# Patient Record
Sex: Female | Born: 1935 | Race: White | Hispanic: No | Smoking: Never smoker
Health system: Southern US, Community
[De-identification: ages and names within clinical notes are randomized; demographics above are authoritative.]

## PROBLEM LIST (undated history)

## (undated) DIAGNOSIS — J189 Pneumonia, unspecified organism: Secondary | ICD-10-CM

## (undated) DIAGNOSIS — I350 Nonrheumatic aortic (valve) stenosis: Secondary | ICD-10-CM

## (undated) DIAGNOSIS — E785 Hyperlipidemia, unspecified: Secondary | ICD-10-CM

## (undated) DIAGNOSIS — K219 Gastro-esophageal reflux disease without esophagitis: Secondary | ICD-10-CM

## (undated) DIAGNOSIS — H548 Legal blindness, as defined in USA: Secondary | ICD-10-CM

## (undated) DIAGNOSIS — E039 Hypothyroidism, unspecified: Secondary | ICD-10-CM

## (undated) DIAGNOSIS — K279 Peptic ulcer, site unspecified, unspecified as acute or chronic, without hemorrhage or perforation: Secondary | ICD-10-CM

## (undated) DIAGNOSIS — M349 Systemic sclerosis, unspecified: Secondary | ICD-10-CM

## (undated) DIAGNOSIS — Z5189 Encounter for other specified aftercare: Secondary | ICD-10-CM

## (undated) DIAGNOSIS — I2510 Atherosclerotic heart disease of native coronary artery without angina pectoris: Secondary | ICD-10-CM

## (undated) DIAGNOSIS — I10 Essential (primary) hypertension: Secondary | ICD-10-CM

## (undated) DIAGNOSIS — M1990 Unspecified osteoarthritis, unspecified site: Secondary | ICD-10-CM

## (undated) DIAGNOSIS — G4733 Obstructive sleep apnea (adult) (pediatric): Secondary | ICD-10-CM

## (undated) DIAGNOSIS — L409 Psoriasis, unspecified: Secondary | ICD-10-CM

## (undated) HISTORY — DX: Legal blindness, as defined in USA: H54.8

## (undated) HISTORY — DX: Hyperlipidemia, unspecified: E78.5

## (undated) HISTORY — PX: OTHER SURGICAL HISTORY: SHX169

## (undated) HISTORY — DX: Peptic ulcer, site unspecified, unspecified as acute or chronic, without hemorrhage or perforation: K27.9

## (undated) HISTORY — DX: Obstructive sleep apnea (adult) (pediatric): G47.33

## (undated) HISTORY — DX: Nonrheumatic aortic (valve) stenosis: I35.0

## (undated) HISTORY — DX: Gastro-esophageal reflux disease without esophagitis: K21.9

## (undated) HISTORY — DX: Atherosclerotic heart disease of native coronary artery without angina pectoris: I25.10

## (undated) HISTORY — DX: Systemic sclerosis, unspecified: M34.9

## (undated) HISTORY — PX: APPENDECTOMY: SHX54

---
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
## 1998-07-23 HISTORY — PX: TOTAL KNEE ARTHROPLASTY: SHX125

## 2001-07-23 HISTORY — PX: CORNEAL TRANSPLANT: SHX108

## 2003-11-23 ENCOUNTER — Other Ambulatory Visit: Payer: Self-pay

## 2004-05-29 ENCOUNTER — Ambulatory Visit: Payer: Self-pay | Admitting: Internal Medicine

## 2005-01-05 ENCOUNTER — Ambulatory Visit: Payer: Self-pay

## 2005-07-23 HISTORY — PX: CHOLECYSTECTOMY: SHX55

## 2006-01-08 ENCOUNTER — Ambulatory Visit: Payer: Self-pay | Admitting: Specialist

## 2006-01-18 ENCOUNTER — Other Ambulatory Visit: Payer: Self-pay

## 2006-01-18 ENCOUNTER — Ambulatory Visit: Payer: Self-pay | Admitting: General Surgery

## 2006-01-25 ENCOUNTER — Ambulatory Visit: Payer: Self-pay | Admitting: General Surgery

## 2006-12-23 ENCOUNTER — Ambulatory Visit: Payer: Self-pay | Admitting: Specialist

## 2007-02-28 ENCOUNTER — Inpatient Hospital Stay: Payer: Self-pay | Admitting: Specialist

## 2007-02-28 ENCOUNTER — Other Ambulatory Visit: Payer: Self-pay

## 2008-01-01 ENCOUNTER — Ambulatory Visit: Payer: Self-pay | Admitting: Internal Medicine

## 2008-06-06 IMAGING — US ABDOMEN ULTRASOUND
1 series · 17 of 25 positions shown · non-contrast
Comparison: none

REASON FOR EXAM: Elevated Liver Enzymes
COMMENTS:

[Series 1: abdomen ultrasound · 17 of 82 slices shown]
[im 1/82]
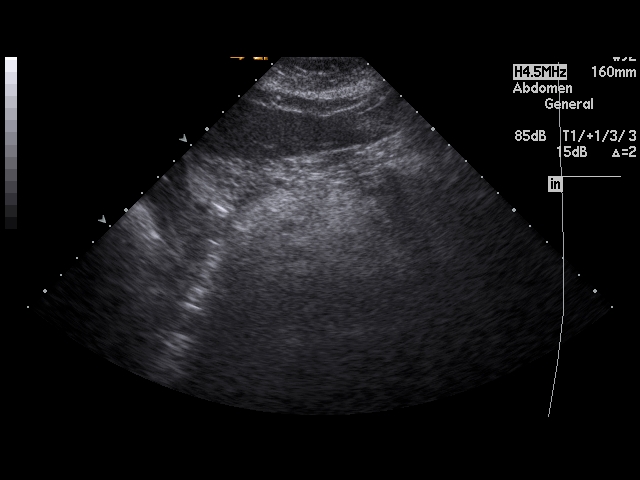
[im 7/82]
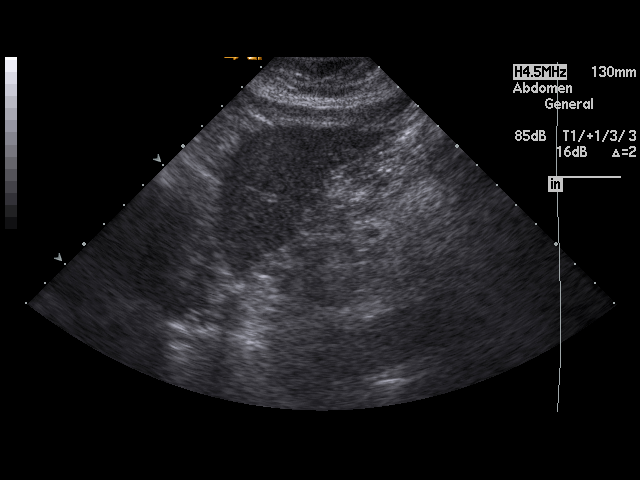
[im 11/82]
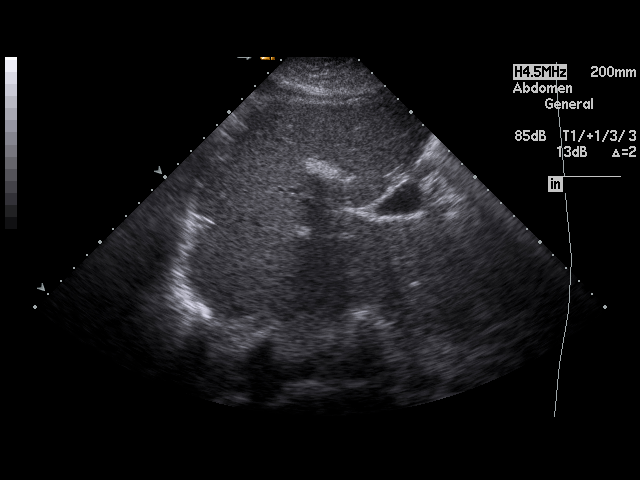
[im 17/82]
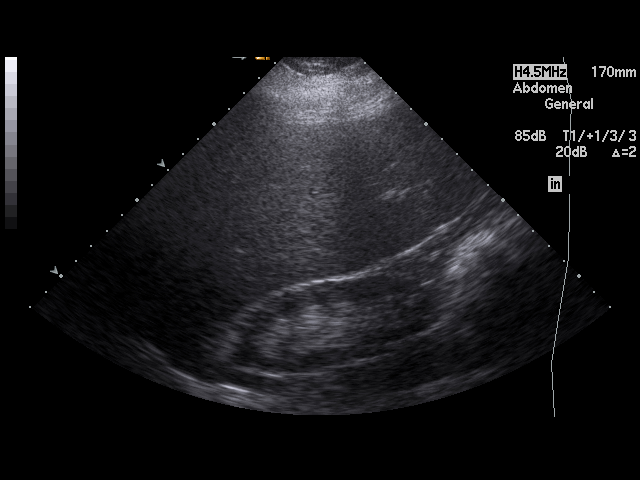
[im 21/82]
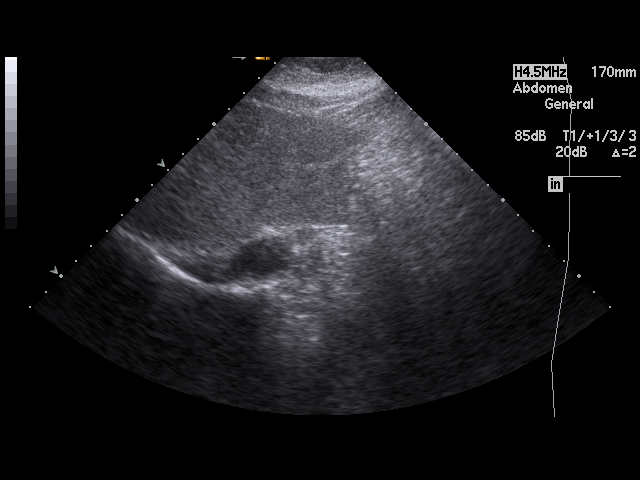
[im 28/82]
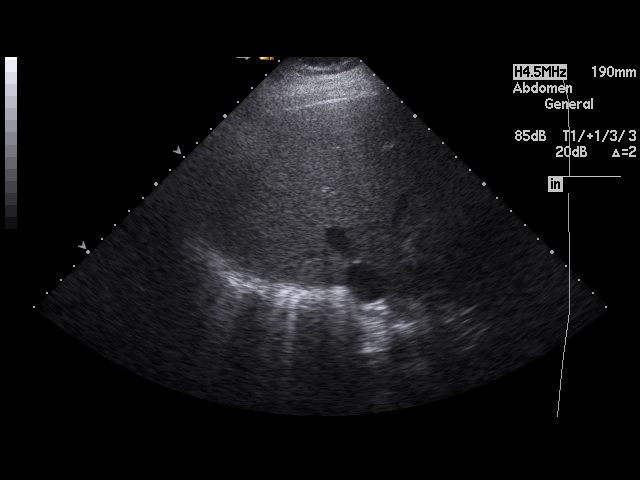
[im 31/82]
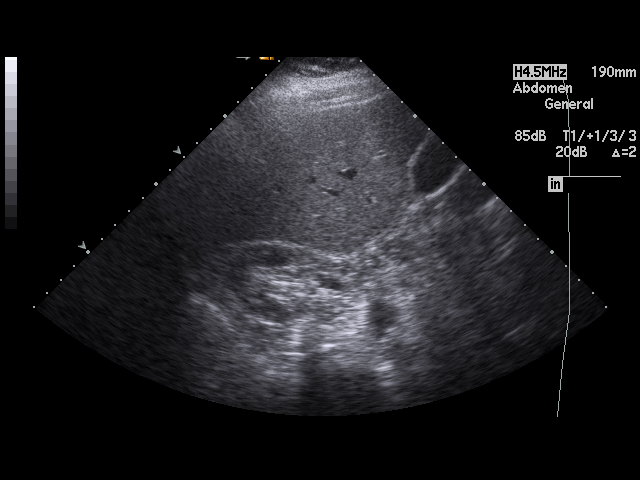
[im 38/82]
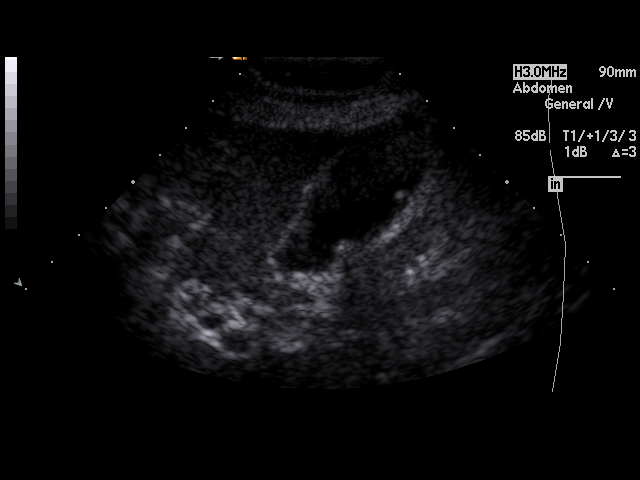
[im 41/82]
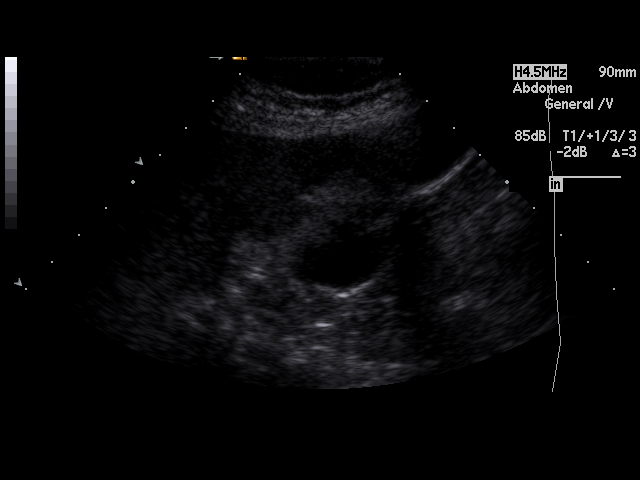
[im 44/82]
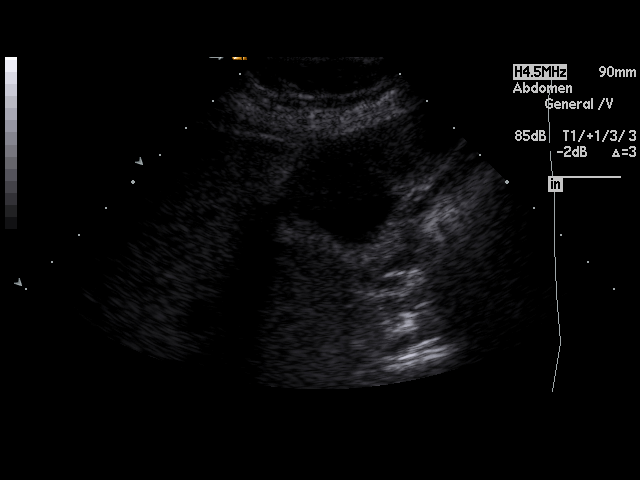
[im 51/82]
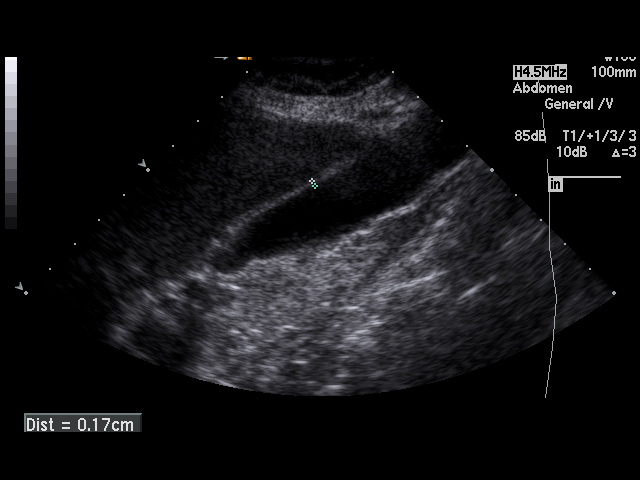
[im 55/82]
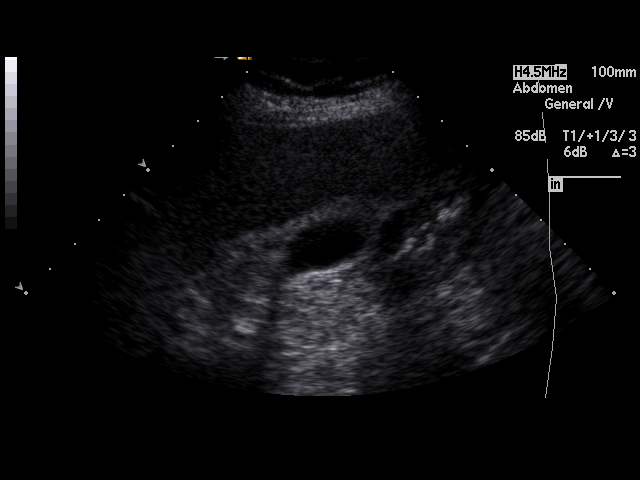
[im 61/82]
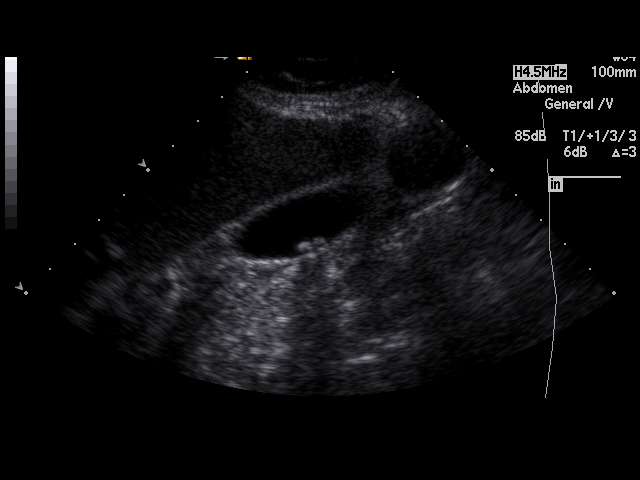
[im 65/82]
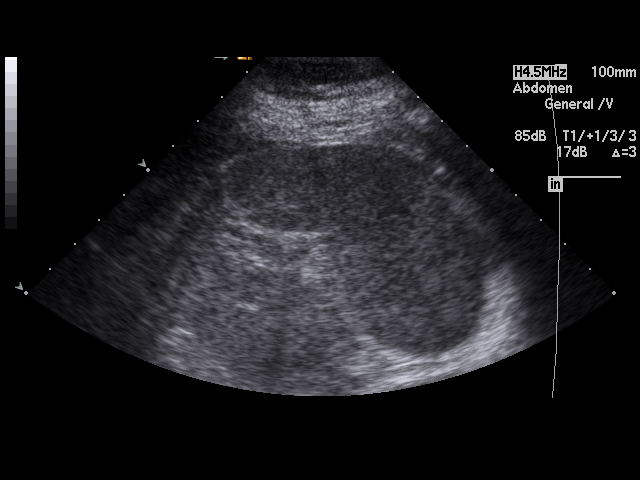
[im 71/82]
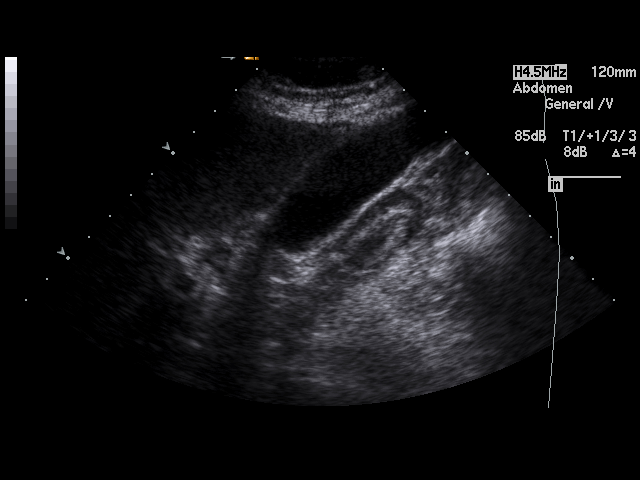
[im 75/82]
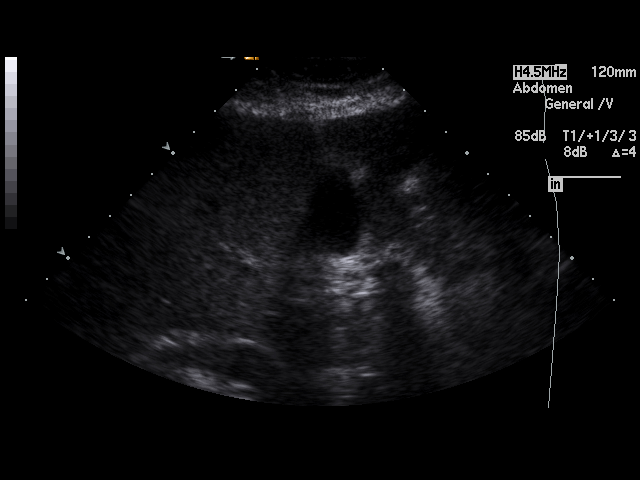
[im 82/82]
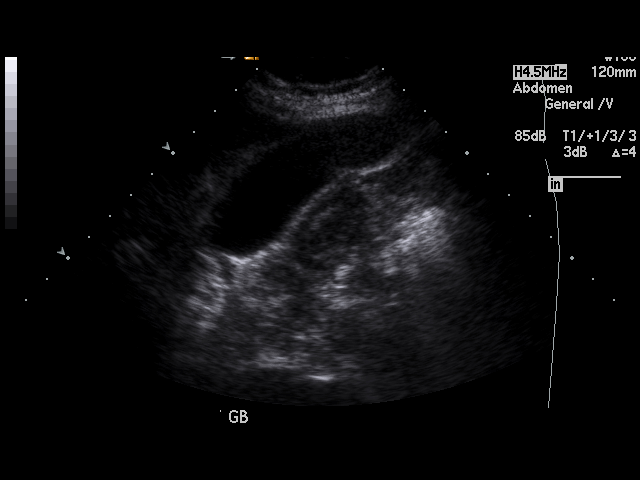

[17 of 25 positions shown; findings below may reference images not displayed]

PROCEDURE:     US  - US ABDOMEN GENERAL SURVEY  - January 08, 2006 [DATE]

RESULT:     The liver and spleen are normal in appearance. Two mobile
echodensities compatible with gallstones are noted in the gallbladder. There
is no thickening of the gallbladder wall. The common bile duct measures
mm in diameter, which is within normal limits.  The kidneys show no
hydronephrosis.  There is no ascites. The pancreas is not visualized
adequately for evaluation on this exam.
IMPRESSION: 1)Cholelithiasis.

## 2008-12-27 ENCOUNTER — Ambulatory Visit: Payer: Self-pay | Admitting: Specialist

## 2009-07-23 HISTORY — PX: CORONARY ARTERY BYPASS GRAFT: SHX141

## 2009-07-23 HISTORY — PX: CARDIAC VALVE REPLACEMENT: SHX585

## 2009-08-30 ENCOUNTER — Encounter: Payer: Self-pay | Admitting: Cardiology

## 2009-11-09 ENCOUNTER — Ambulatory Visit: Payer: Self-pay | Admitting: Specialist

## 2009-11-10 ENCOUNTER — Ambulatory Visit: Payer: Self-pay | Admitting: Specialist

## 2009-11-15 ENCOUNTER — Ambulatory Visit: Payer: Self-pay | Admitting: Specialist

## 2010-04-18 ENCOUNTER — Emergency Department: Payer: Self-pay | Admitting: Emergency Medicine

## 2010-05-12 ENCOUNTER — Encounter: Payer: Self-pay | Admitting: Cardiology

## 2011-07-22 ENCOUNTER — Emergency Department (INDEPENDENT_AMBULATORY_CARE_PROVIDER_SITE_OTHER)
Admission: EM | Admit: 2011-07-22 | Discharge: 2011-07-22 | Disposition: A | Discharge status: Home / Self Care | Payer: Medicare Other | Source: Home / Self Care

## 2011-07-22 DIAGNOSIS — R21 Rash and other nonspecific skin eruption: Secondary | ICD-10-CM

## 2011-07-22 HISTORY — DX: Essential (primary) hypertension: I10

## 2011-07-22 MED ORDER — ERYTHROMYCIN BASE 500 MG PO TABS: 500.0000 mg | ORAL_TABLET | Freq: Three times a day (TID) | ORAL | Status: AC

## 2011-07-22 NOTE — ED Notes (Signed)
Hx psoriasis states ears were inflamed before and was given ABT

## 2011-07-22 NOTE — ED Provider Notes (Addendum)
History     CSN: [id number]  Arrival date & time 07/22/11  1511   None     Chief Complaint  Patient presents with  . Rash    (Consider location/radiation/quality/duration/timing/severity/associated sxs/prior treatment) HPI She has a history of psoriasis and once in a while the area behind her ears gets inflamed, red, and painful. It has been going on for last few days in the past her primary care physician has put her on erythromycin which tends to help. They've also used some Eucerin cream today which is also helping. No fever, chills, or constitutional symptoms.   Past Medical History  Diagnosis Date  . Hypertension     History reviewed. No pertinent past surgical history.  History reviewed. No pertinent family history.  History  Substance Use Topics  . Smoking status: Never Smoker   . Smokeless tobacco: Not on file  . Alcohol Use: No    OB History    Grav Para Term Preterm Abortions TAB SAB Ect Mult Living                  Review of Systems  Allergies  Levaquin  Home Medications   Current Outpatient Rx  Name Route Sig Dispense Refill  . AMITRIPTYLINE HCL 10 MG PO TABS Oral Take 10 mg by mouth at bedtime.      Marland Kitchen RABEPRAZOLE SODIUM 20 MG PO TBEC Oral Take 20 mg by mouth daily.      Marland Kitchen VALSARTAN 40 MG PO TABS Oral Take 40 mg by mouth daily.        BP 180/90  Pulse 81  Temp(Src) 98.6 F (37 C) (Oral)  Resp 20  Ht 5' (1.524 m)  SpO2 91%  Physical Exam  Nursing note and vitals reviewed. Constitutional: She is oriented to person, place, and time. She appears well-developed and well-nourished.  HENT:  Head: Normocephalic and atraumatic.  Eyes: No scleral icterus.  Neck: Neck supple.  Cardiovascular: Regular rhythm and normal heart sounds.   Pulmonary/Chest: Effort normal and breath sounds normal. No respiratory distress.  Neurological: She is alert and oriented to person, place, and time.  Skin: Skin is warm and dry.       The area behind both  ears/pinna are inflamed, red, cracked, and slightly tender to palpation. The right side a little but worse than left. Ear canals and tympanic membrane are normal. Oropharynx is patent and clear.  Psychiatric: She has a normal mood and affect. Her speech is normal.    ED Course  Procedures (including critical care time)  Labs Reviewed - No data to display No results found.   No diagnosis found.    MDM   I will place her on an antibiotic. I've also advised that she use cortisone cream and can alternate that with Vaseline or Eucerin cream or antibiotic cream. If she is not getting better, I would like her to followup either with her current care physician or a dermatologist.   Lily Kocher, MD 07/22/11 1553  Needs to follow up with PCP re: HTN.  ER precautions for stroke symptoms, etc.  Lily Kocher, MD 07/22/11 1556

## 2011-09-23 ENCOUNTER — Encounter: Payer: Self-pay | Admitting: Emergency Medicine

## 2011-09-23 ENCOUNTER — Emergency Department (INDEPENDENT_AMBULATORY_CARE_PROVIDER_SITE_OTHER)
Admission: EM | Admit: 2011-09-23 | Discharge: 2011-09-23 | Disposition: A | Discharge status: Home / Self Care | Payer: Medicare Other | Source: Home / Self Care | Attending: Family Medicine | Admitting: Family Medicine

## 2011-09-23 DIAGNOSIS — Z8709 Personal history of other diseases of the respiratory system: Secondary | ICD-10-CM

## 2011-09-23 DIAGNOSIS — J209 Acute bronchitis, unspecified: Secondary | ICD-10-CM

## 2011-09-23 MED ORDER — CEFDINIR 300 MG PO CAPS: 300.0000 mg | ORAL_CAPSULE | Freq: Two times a day (BID) | ORAL | Status: AC

## 2011-09-23 NOTE — Discharge Instructions (Signed)
Take plain Mucinex (guaifenesin) daily for cough and congestion.  Increase fluid intake, rest.  May take Delsym Cough Suppressant at bedtime for nighttime cough.   Continue inhaler

## 2011-09-23 NOTE — ED Provider Notes (Signed)
History     CSN: [id number]  Arrival date & time 09/23/11  1337   First MD Initiated Contact with Patient 09/23/11 1519      Chief Complaint  Patient presents with  . URI      HPI Comments: Patient complains of approximately 4 day history of gradually progressive URI symptoms beginning with a mild sore throat (now improved), followed by progressive nasal congestion.  A cough started next.  Complains of fatigue and initial myalgias.  Cough is now worse at night and generally non-productive during the day.  There has been no pleuritic pain.  She complains of occasional shortness of breath and wheezes.  Today she had an episode of nausea/vomiting, resolved.  She states that she has a history of bronchiectasis.  The history is provided by the patient.    Past Medical History  Diagnosis Date  . Hypertension     History reviewed. No pertinent past surgical history.  History reviewed. No pertinent family history.  History  Substance Use Topics  . Smoking status: Never Smoker   . Smokeless tobacco: Not on file  . Alcohol Use: No    OB History    Grav Para Term Preterm Abortions TAB SAB Ect Mult Living                  Review of Systems + sore throat + cough No pleuritic pain + wheezing + nasal congestion + post-nasal drainage No sinus pain/pressure No itchy/red eyes No earache No hemoptysis + SOB + fever, + chills + nausea, resolved + vomiting today, resolved No abdominal pain No diarrhea No urinary symptoms No skin rashes + fatigue No myalgias + headache Used OTC meds without relief (Mucinex) Allergies  Levaquin  Home Medications   Current Outpatient Rx  Name Route Sig Dispense Refill  . FUROSEMIDE 20 MG PO TABS Oral Take 20 mg by mouth 2 (two) times daily.    Marland Kitchen METOPROLOL SUCCINATE ER 100 MG PO TB24 Oral Take 100 mg by mouth daily. Take with or immediately following a meal.    . AMITRIPTYLINE HCL 10 MG PO TABS Oral Take 10 mg by mouth at bedtime.        Marland Kitchen CEFDINIR 300 MG PO CAPS Oral Take 1 capsule (300 mg total) by mouth 2 (two) times daily. 20 capsule 0  . RABEPRAZOLE SODIUM 20 MG PO TBEC Oral Take 20 mg by mouth daily.      Marland Kitchen VALSARTAN 40 MG PO TABS Oral Take 40 mg by mouth daily.        BP 179/80  Temp 97.9 F (36.6 C)  Resp 18  Ht 5' (1.524 m)  Wt 154 lb (69.854 kg)  BMI 30.08 kg/m2  SpO2 92%  Physical Exam Nursing notes and Vital Signs reviewed. Appearance:  Patient appears stated age, and in no acute distress.  She is alert and oriented  Eyes:  Pupils are equal, round, and reactive to light and accomodation.  Extraocular movement is intact.  Conjunctivae are not inflamed  Ears:  Canals normal.  Tympanic membranes normal.  Nose:  Mildly congested turbinates.  No sinus tenderness.   Pharynx:  Normal Neck:  Supple.  Slightly tender shotty posterior nodes are palpated bilaterally  Lungs:  Scattered rhonchi; no rales.  Breath sounds are equal.  Heart:  Regular rate and rhythm without murmurs, rubs, or gallops.  Abdomen:  Nontender without masses or hepatosplenomegaly.  Bowel sounds are present.  No CVA or flank tenderness.  Extremities:  Trace edema.  No calf tenderness Skin:  No rash present.   ED Course  Procedures none      1. Acute bronchitis   2. History of bronchiectasis       MDM  Begin Omnicef. Take plain Mucinex (guaifenesin) daily for cough and congestion.  Increase fluid intake, rest.  May take Delsym Cough Suppressant at bedtime for nighttime cough.   Continue inhaler Followup with Family Doctor if not improved in 5 days.         Donna Christen, MD 09/24/11 [id number]

## 2011-09-23 NOTE — ED Notes (Signed)
Cold S&S x 4 days. Concerned because of advanced age. No Flu vaccination this season.

## 2011-11-21 ENCOUNTER — Ambulatory Visit (INDEPENDENT_AMBULATORY_CARE_PROVIDER_SITE_OTHER)
Admission: RE | Admit: 2011-11-21 | Discharge: 2011-11-21 | Disposition: A | Discharge status: Home / Self Care | Payer: Medicare Other | Source: Ambulatory Visit | Attending: Internal Medicine | Admitting: Internal Medicine

## 2011-11-21 ENCOUNTER — Ambulatory Visit (INDEPENDENT_AMBULATORY_CARE_PROVIDER_SITE_OTHER): Payer: Medicare Other | Admitting: Internal Medicine

## 2011-11-21 ENCOUNTER — Encounter: Payer: Self-pay | Admitting: Cardiology

## 2011-11-21 ENCOUNTER — Other Ambulatory Visit (INDEPENDENT_AMBULATORY_CARE_PROVIDER_SITE_OTHER): Payer: Medicare Other

## 2011-11-21 ENCOUNTER — Encounter: Payer: Self-pay | Admitting: Internal Medicine

## 2011-11-21 ENCOUNTER — Ambulatory Visit (INDEPENDENT_AMBULATORY_CARE_PROVIDER_SITE_OTHER): Payer: Medicare Other | Admitting: Cardiology

## 2011-11-21 ENCOUNTER — Other Ambulatory Visit: Payer: Self-pay | Admitting: Cardiology

## 2011-11-21 VITALS — BP 160/78 | HR 60 | Ht 60.0 in | Wt 152.0 lb

## 2011-11-21 VITALS — BP 122/68 | HR 66 | Temp 98.2°F | Ht 60.0 in | Wt 154.4 lb

## 2011-11-21 DIAGNOSIS — I359 Nonrheumatic aortic valve disorder, unspecified: Secondary | ICD-10-CM

## 2011-11-21 DIAGNOSIS — Z952 Presence of prosthetic heart valve: Secondary | ICD-10-CM | POA: Insufficient documentation

## 2011-11-21 DIAGNOSIS — E785 Hyperlipidemia, unspecified: Secondary | ICD-10-CM | POA: Insufficient documentation

## 2011-11-21 DIAGNOSIS — I2510 Atherosclerotic heart disease of native coronary artery without angina pectoris: Secondary | ICD-10-CM

## 2011-11-21 DIAGNOSIS — R0600 Dyspnea, unspecified: Secondary | ICD-10-CM

## 2011-11-21 DIAGNOSIS — J479 Bronchiectasis, uncomplicated: Secondary | ICD-10-CM

## 2011-11-21 DIAGNOSIS — R0989 Other specified symptoms and signs involving the circulatory and respiratory systems: Secondary | ICD-10-CM

## 2011-11-21 DIAGNOSIS — Z953 Presence of xenogenic heart valve: Secondary | ICD-10-CM

## 2011-11-21 DIAGNOSIS — R0609 Other forms of dyspnea: Secondary | ICD-10-CM

## 2011-11-21 DIAGNOSIS — I10 Essential (primary) hypertension: Secondary | ICD-10-CM

## 2011-11-21 LAB — CBC WITH DIFFERENTIAL/PLATELET
Basophils Relative: 1.5 % (ref 0.0–3.0)
Eosinophils Absolute: 0.2 10*3/uL (ref 0.0–0.7)
Eosinophils Relative: 2.5 % (ref 0.0–5.0)
Lymphocytes Relative: 26.5 % (ref 12.0–46.0)
Monocytes Relative: 8.9 % (ref 3.0–12.0)
Neutrophils Relative %: 60.6 % (ref 43.0–77.0)
RBC: 3.96 Mil/uL (ref 3.87–5.11)
WBC: 7.5 10*3/uL (ref 4.5–10.5)

## 2011-11-21 LAB — BRAIN NATRIURETIC PEPTIDE: Pro B Natriuretic peptide (BNP): 140 pg/mL — ABNORMAL HIGH (ref 0.0–100.0)

## 2011-11-21 LAB — TSH: TSH: 1.06 u[IU]/mL (ref 0.35–5.50)

## 2011-11-21 LAB — BASIC METABOLIC PANEL
Chloride: 98 mEq/L (ref 96–112)
GFR: 41.15 mL/min — ABNORMAL LOW (ref >60.00)
Potassium: 5.3 mEq/L — ABNORMAL HIGH (ref 3.5–5.1)
Sodium: 136 mEq/L (ref 135–145)

## 2011-11-21 MED ORDER — OMEPRAZOLE 40 MG PO CPDR: 40.0000 mg | DELAYED_RELEASE_CAPSULE | Freq: Every day | ORAL | Status: DC

## 2011-11-21 NOTE — Patient Instructions (Signed)
Your physician wants you to follow-up in: 6 MONTHS WITH DR Jens Som IN [place] You will receive a reminder letter in the mail two months in advance. If you don't receive a letter, please call our office to schedule the follow-up appointment.   Your physician has requested that you have an echocardiogram. Echocardiography is a painless test that uses sound waves to create images of your heart. It provides your doctor with information about the size and shape of your heart and how well your heart's chambers and valves are working. This procedure takes approximately one hour. There are no restrictions for this procedure.   Your physician recommends that you HAVE LAB WORK TODAY

## 2011-11-21 NOTE — Assessment & Plan Note (Signed)
Management per pulmonary. 

## 2011-11-21 NOTE — Progress Notes (Signed)
  Subjective:    Patient ID: [name], female    DOB: Dec 22, 1918  MRN: [id number]  HPI  61 yowf dx bronchiectasis at [facility] in 1969 and in between flares of "pna'  Breathing not quite right but better when on  Theoph and placed on 02 x 2010 by Meredeth Ide.  11/21/2011 1st pulmonary eval cc doe x hc parking and getting into store even on 02.  No flares of cough/ sob x   several months,  But when increase cough increase sob typically assoc. Cough is worst toward evenings > sometimes green, usually mucoid, no   overt sinus complaints, some mild dysphagia x years.  No obvious daytime variabilty or assoc  cp or chest tightness, subjective wheeze overt  hb symptoms. No unusual exp hx or h/o childhood pna/ asthma or premature birth to her knowledge.   Sleeping ok without nocturnal  or early am exacerbation  of respiratory  c/o's or need for noct saba. Also denies any obvious fluctuation of symptoms with weather or environmental changes or other aggravating or alleviating factors except as outlined above    Review of Systems  Constitutional: Negative for fever, chills and unexpected weight change.  HENT: Positive for trouble swallowing. Negative for ear pain, nosebleeds, congestion, sore throat, rhinorrhea, sneezing, dental problem, voice change, postnasal drip and sinus pressure.   Eyes: Negative for visual disturbance.  Respiratory: Positive for cough and shortness of breath. Negative for choking.   Cardiovascular: Negative for chest pain and leg swelling.  Gastrointestinal: Negative for vomiting, abdominal pain and diarrhea.  Genitourinary: Negative for difficulty urinating.  Musculoskeletal: Positive for arthralgias.  Skin: Negative for rash.  Neurological: Positive for headaches. Negative for tremors and syncope.  Hematological: Does not bruise/bleed easily.       Objective:   Physical Exam amb wf < stated age Wt 154 11/21/2011   HEENT: nl dentition, turbinates, and orophanx. Nl external ear  canals without cough reflex   NECK :  without JVD/Nodes/TM/ nl carotid upstrokes bilaterally   LUNGS: no acc muscle use, clear to A and P bilaterally without cough on insp or exp maneuvers   CV:  RRR  no s3 or murmur or increase in P2, no edema   ABD:  soft and nontender with nl excursion in the supine position. No bruits or organomegaly, bowel sounds nl  MS:  warm without deformities, calf tenderness, cyanosis or clubbing  SKIN: warm and dry without lesions    NEURO:  alert, approp, no deficits       CXR  11/21/2011 :  Asymmetric pulmonary parenchymal changes mid to lower lung zones greatest right lung base. This may reflect chronic changes although a superimposed infiltrate or mass is difficult to exclude. Follow up as noted above.  Prior median sternotomy and valve replacement. Cardiomegaly.  Central pulmonary vascular prominence.  Calcified tortuous aorta.       Assessment & Plan:

## 2011-11-21 NOTE — Assessment & Plan Note (Signed)
Plan obtain all records from [facility] concerning previous aortic valve replacement. Continue SBE prophylaxis. Repeat echocardiogram.

## 2011-11-21 NOTE — Progress Notes (Signed)
HPI: 78 year old female for history of AVR and CAD. Chest CT in April of 2011 showed fibrotic changes and bronchiectasis. Chest x-ray earlier today showed asymmetric pulmonary parenchymal changes in the mid to lower lung zones greatest in the right lung base. BNP today 140. Patient had aortic valve replacement at [facility] several years ago. I do not have those records available. She apparently has had stents placed in [place] previously as well. She recently moved to [place]. She presents to establish. She does have dyspnea on exertion but there is no orthopnea or PND. She has mild pedal edema control with diuretics. She has chest pain approximately one time per month that resolved with 1-2 nitroglycerin. It is unchanged in severity or frequency. She does not have palpitations or syncope.  Current Outpatient Prescriptions  Medication Sig Dispense Refill  . amitriptyline (ELAVIL) 50 MG tablet Take 50 mg by mouth at bedtime.      . furosemide (LASIX) 40 MG tablet Take 40 mg by mouth daily.      . Ibuprofen-Diphenhydramine Cit (ADVIL PM PO) Take 1 tablet by mouth at bedtime as needed.      . Melatonin 10 MG TABS Take 1 tablet by mouth at bedtime.      . metoprolol succinate (TOPROL-XL) 25 MG 24 hr tablet Take 25 mg by mouth daily.      Marland Kitchen omeprazole (PRILOSEC) 40 MG capsule Take 1 capsule (40 mg total) by mouth daily.  90 capsule  3  . prednisoLONE acetate (PRED FORTE) 1 % ophthalmic suspension Place 1 drop into both eyes daily.      . valsartan (DIOVAN) 320 MG tablet Take 320 mg by mouth daily.      Marland Kitchen DISCONTD: omeprazole (PRILOSEC) 40 MG capsule Take 40 mg by mouth daily.        Allergies  Allergen Reactions  . Levofloxacin Rash    Past Medical History  Diagnosis Date  . Hypertension   . Hyperlipidemia   . OSA (obstructive sleep apnea)     not on CPAP  . Bronchiectasis   . Aortic stenosis   . CAD (coronary artery disease)     Present PCI and MI  . GERD (gastroesophageal  reflux disease)   . PUD (peptic ulcer disease)   . Legally blind     Past Surgical History  Procedure Date  . Cardiac valve replacement 2011  . Coronary artery bypass graft 2011  . Left shoulder surgery   . Corneal transplant   . Cholecystectomy   . Appendectomy   . Abdominal hysterectomy   . Total knee arthroplasty     History   Social History  . Marital Status: Widowed    Spouse Name: N/A    Number of Children: 2  . Years of Education: N/A   Occupational History  . Retired    Social History Main Topics  . Smoking status: Never Smoker   . Smokeless tobacco: Never Used  . Alcohol Use: No  . Drug Use: No  . Sexually Active: Not on file   Other Topics Concern  . Not on file   Social History Narrative  . No narrative on file    Family History  Problem Relation Age of Onset  . Heart disease Sister   . Heart disease Brother     MI x 2  . Lung disease Brother     ? dz, was a smoker    ROS: Chronic productive cough and blindness but no fevers or chills,  hemoptysis, dysphasia, odynophagia, melena, hematochezia, dysuria, hematuria, rash, seizure activity, orthopnea, PND,  claudication. Remaining systems are negative.  Physical Exam:  Blood pressure 160/78, pulse 60, height 5' (1.524 m), weight 68.947 kg (152 lb).  General:  Well developed/well nourished in NAD Skin warm/dry Patient not depressed No peripheral clubbing Back-normal HEENT-normal/normal eyelids Neck supple/normal carotid upstroke bilaterally; no bruits; no JVD; no thyromegaly chest - CTA/ normal expansion CV - RRR/normal S1 and S2; no rubs or gallops;  PMI nondisplaced, 2/6 systolic murmur left sternal border. No diastolic murmur. Abdomen -NT/ND, no HSM, no mass, + bowel sounds, no bruit 2+ femoral pulses, no bruits Ext-no edema, chords, 1+ DP Neuro-grossly nonfocal  ECG normal sinus rhythm at a rate of 92. First-degree AV block. No ST changes.

## 2011-11-21 NOTE — Assessment & Plan Note (Signed)
Patient not on aspirin as apparently this caused bleeding in the past. Will review outside records. She occasionally has chest pain. However this appears to be unchanged. Given age I would like to be conservative if possible. Continue beta blocker. Patient and daughter are in agreement with limiting testing if possible.

## 2011-11-21 NOTE — Patient Instructions (Signed)
Take omeprazole Take 30-60 min before first meal of the day   Please remember to go to the lab and x-ray department downstairs for your tests - we will call you with the results when they are available.    GERD (REFLUX)  is an extremely common cause of respiratory symptoms, many times with no significant heartburn at all.    It can be treated with medication, but also with lifestyle changes including avoidance of late meals, excessive alcohol, smoking cessation, and avoid fatty foods, chocolate, peppermint, colas, red wine, and acidic juices such as orange juice.  NO MINT OR MENTHOL PRODUCTS SO NO COUGH DROPS  USE SUGARLESS CANDY INSTEAD (jolley ranchers or Stover's)  NO OIL BASED VITAMINS - use powdered substitutes.   Please schedule a follow up office visit in 4 weeks, sooner if needed with pfts

## 2011-11-21 NOTE — Assessment & Plan Note (Signed)
Check lipids 

## 2011-11-21 NOTE — Assessment & Plan Note (Signed)
Blood pressure mildly elevated. We will follow him adjust medications as needed. Check potassium and renal function.

## 2011-11-22 ENCOUNTER — Encounter: Payer: Self-pay | Admitting: Internal Medicine

## 2011-11-22 ENCOUNTER — Telehealth: Payer: Self-pay | Admitting: Cardiology

## 2011-11-22 LAB — BASIC METABOLIC PANEL WITH GFR
BUN: 15 mg/dL (ref 6–23)
CO2: 29 mEq/L (ref 19–32)
Chloride: 98 mEq/L (ref 96–112)
GFR, Est Non African American: 39 mL/min — ABNORMAL LOW
Glucose, Bld: 93 mg/dL (ref 70–99)
Potassium: 5 mEq/L (ref 3.5–5.3)

## 2011-11-22 LAB — LIPID PANEL
HDL: 37 mg/dL — ABNORMAL LOW (ref >39)
Total CHOL/HDL Ratio: 6.1 Ratio

## 2011-11-22 NOTE — Assessment & Plan Note (Addendum)
Labs are ok including BNP and Symptoms are markedly disproportionate to objective findings and not clear this is a lung problem but pt does appear to have difficult airway management issues.  DDX of  difficult airways managment all start with A and  include Adherence, Ace Inhibitors, Acid Reflux, Active Sinus Disease, Alpha 1 Antitripsin deficiency, Anxiety masquerading as Airways dz,  ABPA,  allergy(esp in young), Aspiration (esp in elderly), Adverse effects of DPI,  Active smokers, plus two Bs  = Bronchiectasis and Beta blocker use..and one C= CHF  ? Acid reflux / has HH > rec max rx and return for pfts as has documented bronchiectasis ? Physiologic impact?

## 2011-11-22 NOTE — Telephone Encounter (Signed)
Spoke with dtr, paperwork faxed to [phone].

## 2011-11-22 NOTE — Assessment & Plan Note (Signed)
-   CT Chest 11/10/09 c/w fibrosis and bilateral basliar bronchiectasis and HH  No evidence of exac, ? Benefit from a maint bronchodilator regimen > return for pft's to sort out

## 2011-11-22 NOTE — Progress Notes (Signed)
Quick Note:  Spoke with pt and notified of results per Dr. Wert. Pt verbalized understanding and denied any questions.  ______ 

## 2011-11-22 NOTE — Telephone Encounter (Signed)
Calling to f/u on information that was to be faxed to the company regarding pt's O2 they have not gotten it yet so she was calling to f/u

## 2011-12-11 ENCOUNTER — Ambulatory Visit (HOSPITAL_COMMUNITY): Payer: Medicare Other | Attending: Cardiology

## 2011-12-11 ENCOUNTER — Other Ambulatory Visit: Payer: Self-pay

## 2011-12-11 DIAGNOSIS — I10 Essential (primary) hypertension: Secondary | ICD-10-CM | POA: Insufficient documentation

## 2011-12-11 DIAGNOSIS — Z954 Presence of other heart-valve replacement: Secondary | ICD-10-CM | POA: Insufficient documentation

## 2011-12-11 DIAGNOSIS — Z952 Presence of prosthetic heart valve: Secondary | ICD-10-CM

## 2011-12-11 DIAGNOSIS — R0600 Dyspnea, unspecified: Secondary | ICD-10-CM

## 2011-12-11 DIAGNOSIS — I359 Nonrheumatic aortic valve disorder, unspecified: Secondary | ICD-10-CM

## 2011-12-11 DIAGNOSIS — I079 Rheumatic tricuspid valve disease, unspecified: Secondary | ICD-10-CM | POA: Insufficient documentation

## 2011-12-11 DIAGNOSIS — I059 Rheumatic mitral valve disease, unspecified: Secondary | ICD-10-CM | POA: Insufficient documentation

## 2011-12-11 DIAGNOSIS — E785 Hyperlipidemia, unspecified: Secondary | ICD-10-CM | POA: Insufficient documentation

## 2011-12-12 ENCOUNTER — Ambulatory Visit (INDEPENDENT_AMBULATORY_CARE_PROVIDER_SITE_OTHER): Payer: Medicare Other | Admitting: Physician Assistant

## 2011-12-12 ENCOUNTER — Encounter: Payer: Self-pay | Admitting: Physician Assistant

## 2011-12-12 VITALS — BP 163/80 | HR 74 | Ht 60.0 in | Wt 153.0 lb

## 2011-12-12 DIAGNOSIS — I10 Essential (primary) hypertension: Secondary | ICD-10-CM

## 2011-12-12 DIAGNOSIS — Z7189 Other specified counseling: Secondary | ICD-10-CM

## 2011-12-12 DIAGNOSIS — E785 Hyperlipidemia, unspecified: Secondary | ICD-10-CM

## 2011-12-12 DIAGNOSIS — Z7689 Persons encountering health services in other specified circumstances: Secondary | ICD-10-CM

## 2011-12-12 NOTE — Progress Notes (Addendum)
  Subjective:    Patient ID: [name], female    DOB: 1919-05-18, 78 y.o.   MRN: [id number]  HPI Patient present to the clinic to establish care. Patient presents with her daughter. Patient has been declared legally blind with scleroderma and macular degeneration. Patient lives with daughter. They want to discuss some type of assistance with meals while daughter is at work. Patient appears to manage herself fairly well despite no one being there. The daughter frequently comes home at lunch or makes her meals early. She sees Dr. Jens Som for cardiology and recently had blood work. She sees Dr. Sherene Sires for pulmonology. She is feeling pretty good. She has no concerns or complaints. She does not think she has had a pneumonia vaccine or zostavax.    Review of Systems     Objective:   Physical Exam  Constitutional: She is oriented to person, place, and time. She appears well-developed and well-nourished.  HENT:  Head: Normocephalic and atraumatic.  Cardiovascular: Normal rate, regular rhythm and normal heart sounds.   Pulmonary/Chest: Effort normal and breath sounds normal. She has no wheezes.  Neurological: She is alert and oriented to person, place, and time.  Skin:       No edema of legs and feet.   Psychiatric: She has a normal mood and affect. Her behavior is normal.          Assessment & Plan:  Hypertension- Instructed patient to call Dr. Jens Som and ask if any medication changes should be made. I do not want to step in without communicating with Dr. Jens Som.   Hyperlipidemia- Dr. Jens Som did recent blood work and wanted patient on a statin. Pt refused because of hx of muscle aches. Discussed new medication livalo with less side effects. Patient states she would think about it.   Goals of Care were discussed- Medicare with not pay for skilled nursing and per daughter patient does not need skilled nursing. Daughter wants someone to visit during the day and maybe provlde lunch while  she is at work. Told her to call [facility] in [place].   Discuss pneumno vaccine and encouraged her to get it.    Depression screening PHQ-9 was 6.   Fall assesment was high risk at 75.

## 2011-12-15 NOTE — Patient Instructions (Addendum)
[facility] in [place] for potential services to senior citizens. Call Dr. Jens Som regarding blood pressure and medications.

## 2012-01-03 ENCOUNTER — Ambulatory Visit (INDEPENDENT_AMBULATORY_CARE_PROVIDER_SITE_OTHER): Payer: Medicare Other | Admitting: Internal Medicine

## 2012-01-03 ENCOUNTER — Encounter (INDEPENDENT_AMBULATORY_CARE_PROVIDER_SITE_OTHER): Payer: Medicare Other

## 2012-01-03 ENCOUNTER — Encounter: Payer: Self-pay | Admitting: Internal Medicine

## 2012-01-03 VITALS — BP 132/68 | HR 81 | Temp 97.6°F | Ht 60.0 in | Wt 153.2 lb

## 2012-01-03 DIAGNOSIS — R0989 Other specified symptoms and signs involving the circulatory and respiratory systems: Secondary | ICD-10-CM

## 2012-01-03 DIAGNOSIS — R0600 Dyspnea, unspecified: Secondary | ICD-10-CM

## 2012-01-03 DIAGNOSIS — R0609 Other forms of dyspnea: Secondary | ICD-10-CM

## 2012-01-03 DIAGNOSIS — R0602 Shortness of breath: Secondary | ICD-10-CM

## 2012-01-03 DIAGNOSIS — J479 Bronchiectasis, uncomplicated: Secondary | ICD-10-CM

## 2012-01-03 DIAGNOSIS — J310 Chronic rhinitis: Secondary | ICD-10-CM | POA: Insufficient documentation

## 2012-01-03 MED ORDER — FLUTICASONE-SALMETEROL 100-50 MCG/DOSE IN AEPB: INHALATION_SPRAY | RESPIRATORY_TRACT | Status: DC

## 2012-01-03 NOTE — Patient Instructions (Addendum)
Please see patient coordinator before you leave today  to schedule a sinus CT  Make sure the omeprazole (prilosec) is 40 mg Take 30-60 min before first meal of the day   Start advair 100/50 one twice daily - smooth deep breath relax and blow all the way out first.  For cough mucinex or mucinex dm per bottle  Please schedule a follow up office visit in 6 weeks, call sooner if needed

## 2012-01-03 NOTE — Progress Notes (Signed)
Subjective:    Patient ID: [name], female    DOB: Jan 13, 1919  MRN: [id number]   Brief patient profile:  89 yowf dx bronchiectasis at [facility] in 1969 and in between flares of "pna'  Breathing not quite right but better when on  Theoph and placed on 02 x 2010 by Meredeth Ide.  11/21/2011 1st pulmonary eval cc doe x hc parking and getting into store even on 02.  No flares of cough/ sob x   several months,  But when increase cough increase sob typically assoc. Cough is worst toward evenings > sometimes green, usually mucoid, no overt sinus complaints, some mild dysphagia x years.  No obvious daytime variabilty or assoc  cp or chest tightness, subjective wheeze overt  hb symptoms. No unusual exp hx or h/o childhood pna/ asthma or premature birth to her knowledge.  rec Take omeprazole Take 30-60 min before first meal of the day  Please remember to go to the lab and x-ray department downstairs for your tests - we will call you with the results when they are available. GERD diet  Please schedule a follow up office visit in 4 weeks, sooner if needed with pfts  12/11/11 Echo> Left ventricle: The cavity size was normal. Wall thickness was increased in a pattern of moderate LVH. There was mild focal basal hypertrophy of the septum. Systolic function was normal. The estimated ejection fraction was in the range of 60% to 65%. There was dynamic obstruction (LVOT gradient 1.2 m/s). Wall motion was normal; there were no regional wall motion abnormalities. Doppler parameters are consistent with abnormal left ventricular relaxation (grade 1 diastolic dysfunction). - Aortic valve: A mechanical prosthesis was present. - Mitral valve: Calcified annulus. Mildly thickened leaflets . Mild to moderate regurgitation. - Left atrium: The atrium was mildly dilated. - Pulmonary arteries: Systolic pressure was mildly increased. PA peak pressure: 36mm Hg (S).    01/03/2012 f/u ov/Prentis Langdon cc variably sob, sometimes just  walking 50 ft,  Variable cough and sinus congestion, pain over R max sinus, not worse at hs or early in am but more likely in afternoon and early evening.  No def purulent sputum. Not clear taking ppi as rec.     Sleeping ok without nocturnal  or early am exacerbation  of respiratory  c/o's or need for noct saba. Also denies any obvious fluctuation of symptoms with weather or environmental changes or other aggravating or alleviating factors except as outlined above.  ROS  At present neg for  any significant sore throat, dysphagia, dental problems, itching, sneezing,  nasal congestion or excess/ purulent secretions, ear ache,   fever, chills, sweats, unintended wt loss, pleuritic or exertional cp, hemoptysis, palpitations, orthopnea pnd or leg swelling.  Also denies presyncope, palpitations, heartburn, abdominal pain, anorexia, nausea, vomiting, diarrhea  or change in bowel or urinary habits, change in stools or urine, dysuria,hematuria,  rash, arthralgias, visual complaints, headache, numbness weakness or ataxia or problems with walking or coordination. No noted change in mood/affect or memory.             Objective:   Physical Exam amb wf < stated age Wt 154 11/21/2011  > 153 01/03/2012   HEENT: nl dentition, turbinates, and orophanx. Nl external ear canals without cough reflex   NECK :  without JVD/Nodes/TM/ nl carotid upstrokes bilaterally   LUNGS: no acc muscle use, clear to A and P bilaterally without cough on insp or exp maneuvers   CV:  RRR  no s3 or murmur  or increase in P2, no edema   ABD:  soft and nontender with nl excursion in the supine position. No bruits or organomegaly, bowel sounds nl  MS:  warm without deformities, calf tenderness, cyanosis or clubbing          CXR  11/21/2011 :  Asymmetric pulmonary parenchymal changes mid to lower lung zones greatest right lung base. This may reflect chronic changes although a superimposed infiltrate or mass is difficult to  exclude. Follow up as noted above.  Prior median sternotomy and valve replacement. Cardiomegaly.  Central pulmonary vascular prominence.  Calcified tortuous aorta.   Labs 11/21/11 nl cbc, bmet, tsh, bnp    Assessment & Plan:

## 2012-01-03 NOTE — Assessment & Plan Note (Addendum)
DDX of  difficult airways managment all start with A and  include Adherence, Ace Inhibitors, Acid Reflux, Active Sinus Disease, Alpha 1 Antitripsin deficiency, Anxiety masquerading as Airways dz,  ABPA,  allergy(esp in young), Aspiration (esp in elderly), Adverse effects of DPI,  Active smokers, plus two Bs  = Bronchiectasis and Beta blocker use..and one C= CHF  Adherence is always the initial "prime suspect" and is a multilayered concern that requires a "trust but verify" approach in every patient - starting with knowing how to use medications, especially inhalers, correctly, keeping up with refills and understanding the fundamental difference between maintenance and prns vs those medications only taken for a very short course and then stopped and not refilled. Reviewed meds in detail with daughter who administers them  ? Acid reflux > important to rx ppi 30 - 60 min ac daily  ? Active sinus dz > ct ordered  ? Bronchiectasis with assoc airflow obstruction > try advair 100 bid if she can master dpi (remains to be seen)

## 2012-01-03 NOTE — Assessment & Plan Note (Signed)
-   CT Chest 11/10/09 c/w fibrosis and bilateral basliar bronchiectasis and HH    - Trial of advair 100/50 bid 01/03/2012 >>>  The proper method of use, as well as anticipated side effects, of dpi inhaler are discussed and demonstrated to the patient.  Variably able to use advair so worth a trial to see if can improve symptoms  Not able to perform pft's

## 2012-01-25 ENCOUNTER — Ambulatory Visit (INDEPENDENT_AMBULATORY_CARE_PROVIDER_SITE_OTHER)
Admission: RE | Admit: 2012-01-25 | Discharge: 2012-01-25 | Disposition: A | Discharge status: Home / Self Care | Payer: Medicare Other | Source: Ambulatory Visit | Attending: Internal Medicine | Admitting: Internal Medicine

## 2012-01-25 ENCOUNTER — Encounter: Payer: Self-pay | Admitting: Internal Medicine

## 2012-01-25 DIAGNOSIS — J310 Chronic rhinitis: Secondary | ICD-10-CM

## 2012-01-25 NOTE — Progress Notes (Signed)
Quick Note:  Spoke with pt and notified of results per Dr. Wert. Pt verbalized understanding and denied any questions.  ______ 

## 2012-01-28 ENCOUNTER — Telehealth: Payer: Self-pay | Admitting: Internal Medicine

## 2012-01-28 ENCOUNTER — Telehealth: Payer: Self-pay | Admitting: Cardiology

## 2012-01-28 ENCOUNTER — Telehealth: Payer: Self-pay | Admitting: *Deleted

## 2012-01-28 MED ORDER — VALSARTAN 320 MG PO TABS: 320.0000 mg | ORAL_TABLET | Freq: Every day | ORAL | Status: DC

## 2012-01-28 MED ORDER — METOPROLOL SUCCINATE ER 25 MG PO TB24: 25.0000 mg | ORAL_TABLET | Freq: Every day | ORAL | Status: DC

## 2012-01-28 MED ORDER — AMITRIPTYLINE HCL 50 MG PO TABS: 50.0000 mg | ORAL_TABLET | Freq: Every day | ORAL | Status: DC

## 2012-01-28 MED ORDER — OMEPRAZOLE 40 MG PO CPDR: 40.0000 mg | DELAYED_RELEASE_CAPSULE | Freq: Every day | ORAL | Status: DC

## 2012-01-28 MED ORDER — FUROSEMIDE 40 MG PO TABS: 40.0000 mg | ORAL_TABLET | Freq: Every day | ORAL | Status: DC

## 2012-01-28 MED ORDER — PREDNISOLONE ACETATE 1 % OP SUSP: 1.0000 [drp] | Freq: Every day | OPHTHALMIC | Status: DC

## 2012-01-28 NOTE — Telephone Encounter (Signed)
Daughter states she doesn't have a psychiatrist and she will contact the eye doc for the drops.

## 2012-01-28 NOTE — Telephone Encounter (Signed)
Ok amitriptyline was sent to pharmacy.

## 2012-01-28 NOTE — Telephone Encounter (Signed)
Her opthomologist needs to refill pred forte eye drops?  If she does not have a psychiatrist I will refill amitripyline.

## 2012-01-28 NOTE — Telephone Encounter (Signed)
P/t's daughter informed

## 2012-01-28 NOTE — Telephone Encounter (Signed)
I spoke with daughter and she states for now on she needs [facility]. Pt did not need rx for advair sent yet bc she has that. i advised will add pharmacy in her chart. Nothing further was needed

## 2012-01-28 NOTE — Telephone Encounter (Signed)
Daughter has called stating she wants to know if we will fill pt's Amitriptyline and Pred forte eye drops since she has been established with [place]? Please advise.

## 2012-01-28 NOTE — Telephone Encounter (Signed)
New msg [name] wants to talk to you

## 2012-01-28 NOTE — Telephone Encounter (Signed)
Please return call to patient daughter Larey Seat [phone] to discuss prescriptions.

## 2012-01-28 NOTE — Telephone Encounter (Signed)
Spoke with pt dtr, questions regarding meds and refills answered.

## 2012-01-28 NOTE — Telephone Encounter (Signed)
Spoke with pt dtr, [facility]

## 2012-01-29 ENCOUNTER — Telehealth: Payer: Self-pay | Admitting: Cardiology

## 2012-01-29 MED ORDER — PREDNISOLONE ACETATE 1 % OP SUSP: 1.0000 [drp] | Freq: Every day | OPHTHALMIC | Status: DC

## 2012-01-29 NOTE — Telephone Encounter (Signed)
New Problem:    Patient's daughter called in wanting a refill of her mother's prednisoLONE acetate (PRED FORTE) 1 % ophthalmic suspension called into the [facility] in [place] (phone-[phone]).  Patient's daughter would like you to give her a call back.

## 2012-01-29 NOTE — Telephone Encounter (Signed)
Spoke with pt dtr, aware refill sent to pharm requested.

## 2012-02-13 ENCOUNTER — Ambulatory Visit (INDEPENDENT_AMBULATORY_CARE_PROVIDER_SITE_OTHER): Payer: Medicare Other | Admitting: Physician Assistant

## 2012-02-13 ENCOUNTER — Ambulatory Visit (INDEPENDENT_AMBULATORY_CARE_PROVIDER_SITE_OTHER): Payer: Medicare Other

## 2012-02-13 ENCOUNTER — Encounter: Payer: Self-pay | Admitting: Physician Assistant

## 2012-02-13 VITALS — BP 172/99 | HR 75 | Ht 60.0 in | Wt 151.0 lb

## 2012-02-13 DIAGNOSIS — M549 Dorsalgia, unspecified: Secondary | ICD-10-CM

## 2012-02-13 DIAGNOSIS — I10 Essential (primary) hypertension: Secondary | ICD-10-CM

## 2012-02-13 DIAGNOSIS — M25559 Pain in unspecified hip: Secondary | ICD-10-CM

## 2012-02-13 DIAGNOSIS — Z9181 History of falling: Secondary | ICD-10-CM

## 2012-02-13 DIAGNOSIS — M25551 Pain in right hip: Secondary | ICD-10-CM

## 2012-02-13 LAB — POCT URINALYSIS DIPSTICK
Bilirubin, UA: NEGATIVE
Glucose, UA: NEGATIVE
Ketones, UA: NEGATIVE
Spec Grav, UA: 1.01
Urobilinogen, UA: 0.2

## 2012-02-13 NOTE — Patient Instructions (Addendum)
Get right hip x-ray. Will call with results. Can take Ibuprofen 2 (400mg ) three times a day. Suggest using cane while moving around. Increase Toprol to 2 tabs daily. REcheck blood pressure in 2 weeks.

## 2012-02-13 NOTE — Progress Notes (Signed)
  Subjective:    Patient ID: [name], female    DOB: [birth year], 78 y.o.   MRN: [id number]  HPI Patient presents to clinic with right hip and groin pain for the past 2 days. The pain is worse when she gets up and bears weight on her legs and best when she's lying down and at night. She denies any radiation of pain down her leg. She denies any low back pain. When asked about if she had fallen she reports that she has on; however, and son-in-law reports that she's not actually fallen to the ground but that she runs into things such as walls and tables. She reports that she has had some burning when she urinates. But denies any fever, chills or discharge. She has been taking ibuprofen 2 in the morning and 2 at night for pain. It does help some. Her blood pressure is elevated today. She denies any chest pains, shortness of breath, or palpitations.    Review of Systems     Objective:   Physical Exam  Constitutional: She is oriented to person, place, and time. She appears well-developed and well-nourished.  HENT:  Head: Normocephalic and atraumatic.  Cardiovascular: Normal rate, regular rhythm and normal heart sounds.   Pulmonary/Chest: Effort normal and breath sounds normal.  Musculoskeletal:       Range of motion of right leg is limited due to pain and age. Pain with deep palpation around the right groin area. No pain over the hip bursa with palpation. No lower leg swelling.  Neurological: She is alert and oriented to person, place, and time.  Skin: Skin is warm and dry.  Psychiatric: She has a normal mood and affect. Her behavior is normal.          Assessment & Plan:  Right hip pain/dysuria-UA was positive for leukocytes. Will send for culture and call patient with results. Will send patient to get x-ray of right hip and call with results. Told patient she could increase ibuprofen to 3 times a day instead of twice a day. This could be affecting her blood pressure therefore if her  blood pressure does not decrease in 2 weeks we may need to think about another way to control pain. Patient encouraged to ice affected area and use cane when walking to avoid a true fall. Follow up if not resolving or worsening.  HTN- Will increase toprol XL to 50mg  once day. I do want to recheck blood pressure in 2 weeks. We'll try to get home health come out to house and check BP if not will need someone to bring patient in the next 2 weeks.

## 2012-02-14 ENCOUNTER — Encounter: Payer: Self-pay | Admitting: Internal Medicine

## 2012-02-14 ENCOUNTER — Other Ambulatory Visit: Payer: Self-pay | Admitting: Physician Assistant

## 2012-02-14 ENCOUNTER — Telehealth: Payer: Self-pay | Admitting: Internal Medicine

## 2012-02-14 ENCOUNTER — Ambulatory Visit (INDEPENDENT_AMBULATORY_CARE_PROVIDER_SITE_OTHER): Payer: Medicare Other | Admitting: Internal Medicine

## 2012-02-14 VITALS — BP 144/82 | HR 66 | Temp 97.7°F | Ht 60.0 in | Wt 153.0 lb

## 2012-02-14 DIAGNOSIS — J479 Bronchiectasis, uncomplicated: Secondary | ICD-10-CM

## 2012-02-14 DIAGNOSIS — M25551 Pain in right hip: Secondary | ICD-10-CM

## 2012-02-14 DIAGNOSIS — J9610 Chronic respiratory failure, unspecified whether with hypoxia or hypercapnia: Secondary | ICD-10-CM

## 2012-02-14 MED ORDER — FORMOTEROL FUMARATE 20 MCG/2ML IN NEBU: 20.0000 ug | INHALATION_SOLUTION | Freq: Two times a day (BID) | RESPIRATORY_TRACT | Status: DC

## 2012-02-14 MED ORDER — BUDESONIDE 0.25 MG/2ML IN SUSP: RESPIRATORY_TRACT | Status: DC

## 2012-02-14 NOTE — Progress Notes (Signed)
Call patient when MRI has been scheduled.

## 2012-02-14 NOTE — Assessment & Plan Note (Signed)
-   Uses 3lpm at hs per Meredeth Ide and prn daytime    - 02/14/2012  Walked RA  2 laps @ 185 ft each stopped due to  Sat 87%  Adequate control on present rx, reviewed need to pace herself until we see if we can improve her fxn with neb laba/ics  Also concerned about concept of medication reconciliation here.  To keep things simple, I have asked the patient to first separate medicines that are perceived as maintenance, that is to be taken daily "no matter what", from those medicines that are taken on only on an as-needed basis and I have given the patient examples of both, and then return to see our NP to generate a  detailed  medication calendar which should be followed until the next physician sees the patient and updates it.    See instructions for specific recommendations which were reviewed directly with the patient who was given a copy with highlighter outlining the key components.

## 2012-02-14 NOTE — Telephone Encounter (Signed)
Not clear to me the daughter is making decisions that are in the patient's best interest - In this situation I think it would be best to let her primary doctor know and he can certainly refer her to another pulmonary group but no one in this group is going to be willing to accept these restrictions in her care as we all want to optimize her not "maintain" her function where it is.  Copy to primary please.

## 2012-02-14 NOTE — Patient Instructions (Addendum)
Please see patient coordinator before you leave today  to schedule a nebulizer and get your medications= performist and budesonide twice daily   See Tammy NP w/in 2 weeks with all your medications, even over the counter meds, separated in two separate bags, the ones you take no matter what vs the ones you stop once you feel better and take only as needed when you feel you need them.   Tammy  will generate for you a new user friendly medication calendar that will put [place] all on the same page re: your medication use.     Without this process, it simply isn't possible to assure that we are providing  your outpatient care  with  the attention to detail we feel you deserve.   If we cannot assure that you're getting that kind of care,  then we cannot manage your problem effectively from this clinic.  Once you have seen Tammy and we are sure that we're all on the same page with your medication use she will arrange follow up with me.

## 2012-02-14 NOTE — Telephone Encounter (Signed)
I spoke with daughter and she states they cancelled pt appt with TP for med calendar. She states pt saw her pcp the other day for pain in her groin down to her hip and had a cxr done. The cxr did not show anything so her pcp is ordering an MRI. If this shows a FX then she will need to be in a full body cast. Per daughter she doesn't need her "fixed" she just needs her to be "maintained". She states she has been seeing [facility] since 1970's haven't been able to "fix her". She is not sure when she will be able to bring pt back in bc the daughter is going to have to have surgery on her leg and she is the one who has to take care of pt. She states pt can't afford to try different medicines that is suppose to "fix" her bc she only receives social security. Per the daughter if MW does not want to try and help her "maintain" the pt then to let her know and she will change to another pulmonologist's that will. I advised will forward to MW.

## 2012-02-14 NOTE — Assessment & Plan Note (Signed)
-   CT Chest 11/10/09 c/w fibrosis and bilateral basliar bronchiectasis and HH    - Trial of advair 100/50 bid 01/03/2012 > could not use effectively > started performist and bud 02/14/2012     - 01/03/2012 unable to do pfts  I had an extended discussion with the patient today lasting 15 to 20 minutes of a 25 minute visit on the following issues:   Unlike when you get a prescription for eyeglasses, it's not possible to always walk out of this or any medical office with a perfect prescription that is immediately effective  based on any test that we offer here.    On the contrary, it may take several weeks for the full impact of changes recommened today - hopefully you will respond well.  If not, then we'll adjust your medication on your next visit accordingly, knowing more then than we can possibly know now.      Next step is trial of laba/ics by neb as can't use dpi so hfa out of the question and could not cooperated with attempt at pft's so treatment is empiric

## 2012-02-14 NOTE — Progress Notes (Signed)
Subjective:    Patient ID: [name], female    DOB: [birth year]  MRN: [id number]   Brief patient profile:  89 yowf dx bronchiectasis at [name] in 1969 and in between flares of "pna'  Breathing not quite right but better when on  Theoph and placed on 02 x 2010 by [name].  11/21/2011 1st pulmonary eval cc doe x hc parking and getting into store even on 02.  No flares of cough/ sob x   several months,  But when increase cough increase sob typically assoc. Cough is worst toward evenings > sometimes green, usually mucoid, no overt sinus complaints, some mild dysphagia x years.  No obvious daytime variabilty or assoc  cp or chest tightness, subjective wheeze overt  hb symptoms. No unusual exp hx or h/o childhood pna/ asthma or premature birth to her knowledge.  rec Take omeprazole Take 30-60 min before first meal of the day  Please remember to go to the lab and x-ray department downstairs for your tests - we will call you with the results when they are available. GERD diet  Please schedule a follow up office visit in 4 weeks, sooner if needed with pfts  12/11/11 Echo> Left ventricle: The cavity size was normal. Wall thickness was increased in a pattern of moderate LVH. There was mild focal basal hypertrophy of the septum. Systolic function was normal. The estimated ejection fraction was in the range of 60% to 65%. There was dynamic obstruction (LVOT gradient 1.2 m/s). Wall motion was normal; there were no regional wall motion abnormalities. Doppler parameters are consistent with abnormal left ventricular relaxation (grade 1 diastolic dysfunction). - Aortic valve: A mechanical prosthesis was present. - Mitral valve: Calcified annulus. Mildly thickened leaflets . Mild to moderate regurgitation. - Left atrium: The atrium was mildly dilated. - Pulmonary arteries: Systolic pressure was mildly increased. PA peak pressure: 36mm Hg (S).    01/03/2012 f/u ov/[name] cc variably sob, sometimes just  walking 50 ft,  Variable cough and sinus congestion, pain over R max sinus, not worse at hs or early in am but more likely in afternoon and early evening.  No def purulent sputum. Not clear taking ppi as rec. rec Please see patient coordinator before you leave today  to schedule a sinus CT> neg Make sure the omeprazole (prilosec) is 40 mg Take 30-60 min before first meal of the day  Start advair 100/50 one twice daily - smooth deep breath relax and blow all the way out first. For cough mucinex or mucinex dm per bottle   02/14/2012  f/u ov/[name] cc sob unchanged but not taking the medications rec "the advair didn't help" not clear how much she actually used cc congested cough > little white phlegm - some better with mucinex.     Sleeping ok without nocturnal  or early am exacerbation  of respiratory  c/o's or need for noct saba. Also denies any obvious fluctuation of symptoms with weather or environmental changes or other aggravating or alleviating factors except as outlined above.   ROS  At present neg for  any significant sore throat, dysphagia, dental problems, itching, sneezing,  nasal congestion or excess/ purulent secretions, ear ache,   fever, chills, sweats, unintended wt loss, pleuritic or exertional cp, hemoptysis, palpitations, orthopnea pnd or leg swelling.  Also denies presyncope, palpitations, heartburn, abdominal pain, anorexia, nausea, vomiting, diarrhea  or change in bowel or urinary habits, change in stools or urine, dysuria,hematuria,  rash, arthralgias, visual complaints, headache, numbness weakness or  ataxia or problems with walking or coordination. No noted change in mood/affect or memory.             Objective:   Physical Exam amb elderly  wf  with hopeless helpless affect and attitude Wt 154 11/21/2011  > 153 01/03/2012 > 02/14/2012  153   HEENT: nl dentition, turbinates, and orophanx. Nl external ear canals without cough reflex   NECK :  without JVD/Nodes/TM/ nl carotid  upstrokes bilaterally   LUNGS: no acc muscle use, clear to A and P bilaterally without cough on insp or exp maneuvers   CV:  RRR  no s3 or murmur or increase in P2, no edema   ABD:  soft and nontender with nl excursion in the supine position. No bruits or organomegaly, bowel sounds nl  MS:  warm without deformities, calf tenderness, cyanosis or clubbing          CXR  11/21/2011 :  Asymmetric pulmonary parenchymal changes mid to lower lung zones greatest right lung base. This may reflect chronic changes although a superimposed infiltrate or mass is difficult to exclude. Follow up as noted above.  Prior median sternotomy and valve replacement. Cardiomegaly.  Central pulmonary vascular prominence.  Calcified tortuous aorta.   Labs 11/21/11 nl cbc, bmet, tsh, bnp    Assessment & Plan:

## 2012-02-14 NOTE — Telephone Encounter (Signed)
I spoke with pt and is aware of MW response. She voiced her understanding. She states pt will still continue to see MW. She wants MW to understand pt is not physical able to see MW every 2-3 weeks. She just wanted MW to do the same as Dr. Jens Som did and just see pt every 6 months. She states if she decides for pt to see another pulmonologist's and she finds one then she will call and let us know. Will forward to pt PCP Dr. Caleen Essex per MW request and MW as an Lorain Childes

## 2012-02-16 LAB — URINE CULTURE: Colony Count: 30000

## 2012-02-20 ENCOUNTER — Telehealth: Payer: Self-pay | Admitting: *Deleted

## 2012-02-20 NOTE — Telephone Encounter (Signed)
Prior auth obtained for MRI Rt Hip thru [facility] [employer]. Auth # is [id number]

## 2012-02-23 ENCOUNTER — Ambulatory Visit (HOSPITAL_BASED_OUTPATIENT_CLINIC_OR_DEPARTMENT_OTHER)
Admission: RE | Admit: 2012-02-23 | Discharge: 2012-02-23 | Disposition: A | Discharge status: Home / Self Care | Payer: Medicare Other | Source: Ambulatory Visit | Attending: Physician Assistant | Admitting: Physician Assistant

## 2012-02-23 DIAGNOSIS — M25559 Pain in unspecified hip: Secondary | ICD-10-CM | POA: Insufficient documentation

## 2012-02-23 DIAGNOSIS — W19XXXA Unspecified fall, initial encounter: Secondary | ICD-10-CM | POA: Insufficient documentation

## 2012-02-23 DIAGNOSIS — M76899 Other specified enthesopathies of unspecified lower limb, excluding foot: Secondary | ICD-10-CM | POA: Insufficient documentation

## 2012-02-23 DIAGNOSIS — M169 Osteoarthritis of hip, unspecified: Secondary | ICD-10-CM | POA: Insufficient documentation

## 2012-02-23 DIAGNOSIS — M25551 Pain in right hip: Secondary | ICD-10-CM

## 2012-02-23 DIAGNOSIS — IMO0002 Reserved for concepts with insufficient information to code with codable children: Secondary | ICD-10-CM | POA: Insufficient documentation

## 2012-02-23 DIAGNOSIS — M1610 Unilateral primary osteoarthritis, unspecified hip: Secondary | ICD-10-CM | POA: Insufficient documentation

## 2012-02-25 ENCOUNTER — Telehealth: Payer: Self-pay | Admitting: *Deleted

## 2012-02-25 DIAGNOSIS — S76319A Strain of muscle, fascia and tendon of the posterior muscle group at thigh level, unspecified thigh, initial encounter: Secondary | ICD-10-CM

## 2012-02-25 NOTE — Telephone Encounter (Signed)
Scheduled PT twice a week for 4 weeks.

## 2012-02-25 NOTE — Telephone Encounter (Signed)
Daughter would like to get Pt scheduled for PT. Please oreder.

## 2012-02-27 ENCOUNTER — Telehealth: Payer: Self-pay | Admitting: *Deleted

## 2012-02-27 NOTE — Telephone Encounter (Signed)
[name] calls stating she doesn't have transportation for Pt to come for PT 2x weekly x 4 weeks. Daughter would like to know if we can forget about PT for now and go ahead w/ the hip inj? Please advise.

## 2012-02-29 NOTE — Telephone Encounter (Signed)
If not improving at all with RESt, Ice, Elevation and ibuprofen then yes we will schedule her to See Dr. Karie Schwalbe for consultation hip injection/evaluation for hamstring tear dx made by MRI. If she is improving conservative therapy is first line.

## 2012-03-04 ENCOUNTER — Telehealth: Payer: Self-pay | Admitting: *Deleted

## 2012-03-04 ENCOUNTER — Telehealth: Payer: Self-pay | Admitting: Physician Assistant

## 2012-03-04 NOTE — Telephone Encounter (Signed)
Advised daughter to check w/ home health, Internet or living will for DNR papers. Daughter will bring them in to be signed once she fings them.

## 2012-03-04 NOTE — Telephone Encounter (Signed)
Spoke with home health occupational therapist. BP reading of 211/107 and then 215/85. Pt has hx of heart attack and stroke. Advised patient to go to Emergency Room. Called daughter and let her know about BP and risk of stroke and heart attack. She is aware and on her way home to take her mother to ED.   Pt denies any CP, palpitations, slurred speech, or extremity weakness. She does complain of dizziness and headache.

## 2012-03-04 NOTE — Telephone Encounter (Signed)
Home health nurse called and states pt's BP is 211/107. Pt took her BP meds around 7 this am.please advise

## 2012-03-04 NOTE — Telephone Encounter (Signed)
Marcelino Duster, [name] called and wants DNR paperwork. Do we have that here? If so can we call pt's daughter and let her know to pick up paperwork and bring back when completed.

## 2012-03-05 ENCOUNTER — Telehealth: Payer: Self-pay | Admitting: *Deleted

## 2012-03-05 ENCOUNTER — Telehealth: Payer: Self-pay | Admitting: Physician Assistant

## 2012-03-05 DIAGNOSIS — I720 Aneurysm of carotid artery: Secondary | ICD-10-CM

## 2012-03-05 MED ORDER — AMLODIPINE BESYLATE 5 MG PO TABS: 5.0000 mg | ORAL_TABLET | Freq: Every day | ORAL | Status: DC

## 2012-03-05 NOTE — Telephone Encounter (Signed)
Spoke with patient's daughter today. She sent over DNR. Went to ER for elevated blood pressure. CT of head was done and revealed a 1.1cm right carotid terminus anerursym. Discussed with daughter that due to age and since she is having no symptoms I think we should leave it alone. I did stress that we did need to control blood pressure and that would help risk factors of rupturing anyeresm. Since she was seen by ED. EKG showed no acute ST changes but 1st degree AV block, Cr elevated boderline at 1.06, and low BUN at 9. I will send over Norvasc to start 5mg  everyday and then follow up with Dr. Ivan Anchors in office on blood pressure before you decide to take a trip out of town for a week.   Home Health will continue to manage blood pressure weekly.

## 2012-03-05 NOTE — Telephone Encounter (Signed)
Please call daughter Toniann Fail at [phone]. Let her know we have talked to sister Jola Babinski and she knows to monitor BP. Norvasc has been sent to pharmacy to start daily. Let her know that I would like for her to see Dr. Ivan Anchors on Friday before she leaves on Saturday to go out of town. He will make sure that BP is not continuing to rise.

## 2012-03-05 NOTE — Telephone Encounter (Signed)
Called patients other daughter Jola Babinski. She is aware that she needs to monitor patients blood pressure and if goes above 160/90 while out of town with her to call office. She is also aware of side effect of swelling that can occur with norvasc. New BP medication.

## 2012-03-05 NOTE — Telephone Encounter (Signed)
Daughter wants to discuss BP med. Pt is with her for the week.

## 2012-03-06 ENCOUNTER — Encounter: Payer: Medicare Other | Admitting: Adult Health

## 2012-03-06 ENCOUNTER — Telehealth: Payer: Self-pay | Admitting: *Deleted

## 2012-03-06 NOTE — Telephone Encounter (Signed)
Nurse from care [facility] called and states pt's BP is 158/64. Pt has only had one dose of the Norvasc 5 mg so  BP meds. Pt was supposed to come in for a BP and check and f/u with Dr. Ivan Anchors next week, however the daughter cannot bring her in tomorrow and the pt is going out of town on sat for at least a week. Since home health nurse is coming tomorrow advised to have nurse check BP tomorrow and report to [place] what number are

## 2012-03-06 NOTE — Telephone Encounter (Signed)
OK continue current regimen and come in when get back from trip. I would like to have her under 150 bc of age but can take a couple fo weeks ot bc completely therapeutic with addiion of amlodipine. Can they also give her her pulse. I want to make sure normal since a Calcium channel blocker was added.

## 2012-03-06 NOTE — Telephone Encounter (Signed)
Daughter aware. Daughter will see if [facility] will come to house tmw to check bp bc she is unable to bring Pt in before next week. Daughter will have [facility] call us.

## 2012-03-07 NOTE — Telephone Encounter (Signed)
[name] notified.

## 2012-03-12 DIAGNOSIS — I10 Essential (primary) hypertension: Secondary | ICD-10-CM

## 2012-03-12 DIAGNOSIS — J8410 Pulmonary fibrosis, unspecified: Secondary | ICD-10-CM

## 2012-03-12 DIAGNOSIS — I2510 Atherosclerotic heart disease of native coronary artery without angina pectoris: Secondary | ICD-10-CM

## 2012-03-12 DIAGNOSIS — J449 Chronic obstructive pulmonary disease, unspecified: Secondary | ICD-10-CM

## 2012-03-17 ENCOUNTER — Ambulatory Visit: Payer: Medicare Other | Admitting: Physician Assistant

## 2012-03-17 ENCOUNTER — Institutional Professional Consult (permissible substitution): Payer: Medicare Other | Admitting: Sports Medicine

## 2012-03-26 ENCOUNTER — Ambulatory Visit (INDEPENDENT_AMBULATORY_CARE_PROVIDER_SITE_OTHER): Payer: Medicare Other | Admitting: Sports Medicine

## 2012-03-26 ENCOUNTER — Encounter: Payer: Self-pay | Admitting: Physician Assistant

## 2012-03-26 ENCOUNTER — Ambulatory Visit (INDEPENDENT_AMBULATORY_CARE_PROVIDER_SITE_OTHER): Payer: Medicare Other | Admitting: Physician Assistant

## 2012-03-26 ENCOUNTER — Telehealth: Payer: Self-pay | Admitting: Physician Assistant

## 2012-03-26 VITALS — BP 187/88 | HR 74 | Ht 60.0 in | Wt 150.0 lb

## 2012-03-26 VITALS — BP 187/88 | HR 74 | Wt 150.0 lb

## 2012-03-26 DIAGNOSIS — L219 Seborrheic dermatitis, unspecified: Secondary | ICD-10-CM

## 2012-03-26 DIAGNOSIS — J069 Acute upper respiratory infection, unspecified: Secondary | ICD-10-CM

## 2012-03-26 DIAGNOSIS — M7070 Other bursitis of hip, unspecified hip: Secondary | ICD-10-CM | POA: Insufficient documentation

## 2012-03-26 DIAGNOSIS — I10 Essential (primary) hypertension: Secondary | ICD-10-CM

## 2012-03-26 DIAGNOSIS — M76899 Other specified enthesopathies of unspecified lower limb, excluding foot: Secondary | ICD-10-CM

## 2012-03-26 DIAGNOSIS — R51 Headache: Secondary | ICD-10-CM

## 2012-03-26 MED ORDER — MELOXICAM 15 MG PO TABS: ORAL_TABLET | ORAL | Status: DC

## 2012-03-26 MED ORDER — CLONIDINE HCL 0.1 MG/24HR TD PTWK: 1.0000 | MEDICATED_PATCH | TRANSDERMAL | Status: DC

## 2012-03-26 MED ORDER — AMOXICILLIN-POT CLAVULANATE 875-125 MG PO TABS: 1.0000 | ORAL_TABLET | Freq: Two times a day (BID) | ORAL | Status: AC

## 2012-03-26 MED ORDER — KETOCONAZOLE 2 % EX CREA: TOPICAL_CREAM | Freq: Two times a day (BID) | CUTANEOUS | Status: DC

## 2012-03-26 NOTE — Progress Notes (Signed)
Patient ID: [name], female   DOB: [birth year], 78 y.o.   MRN: [id number] Subjective:    I'm seeing this patient as a consultation for:  [name] [facility]  CC: Right hip and groin pain.  HPI:  [name] is an extremely pleasant 78 year old female comes in with about 2 weeks of pain that she localizes deep in her right buttock, radiating around to the anterior aspect of her groin. She recalls this started after a fall. The pain is localized in the above regions, does not radiate down her leg, or up into her back. She has already had x-rays that I reviewed, as well as an MRI that showed some tearing, and tendinosis of the proximal hamstring near the ischial tuberosity. She has failed oral analgesics, and declines physical therapy, she is unable to get transportation. She is desiring interventional therapy.  Past medical history, Surgical history, Family history, Social history, Allergies, and medications have been entered into the medical record, reviewed, and no changes needed.   Review of Systems: No headache, visual changes, nausea, vomiting, diarrhea, constipation, dizziness, abdominal pain, skin rash, fevers, chills, night sweats, weight loss, body aches, joint swelling, muscle aches, chest pain, or shortness of breath.   Objective:   Vitals:  Afebrile, vital signs stable. General: Well Developed, well nourished, and in no acute distress.  Neuro: Alert and oriented x3, extra-ocular muscles intact.  Skin: Warm and dry, no rashes noted.  Respiratory: Not using accessory muscles, speaking in full sentences.  Cardiovascular: Pulses palpable, no extremity edema. Right  Hip: ROM IR: 45 Deg, ER: 45 Deg, Flexion: 120 Deg, Extension: 100 Deg, Abduction: 45 Deg, Adduction: 45 Deg Strength IR: 5/5, ER: 5/5, Flexion: 4-/5 with significant pain, Extension: 5/5, Abduction: 5/5, Adduction: 5/5 I can also reproduce her pain with resisted knee flexion. Pelvic alignment unremarkable to inspection and  palpation. Standing hip rotation and gait without trendelenburg sign / unsteadiness. Greater trochanter without tenderness to palpation. No pain with FABER or FADIR. No SI joint tenderness and normal minimal SI movement. She is significant tenderness to palpation over the ischial tuberosity.  I did review her x-rays, they show mild degenerative changes with a small amount spurring, as well as some acetabular subchondral cystic changes.  I did also review her MRI, I also see moderate degenerative changes in both hip joints, she does have a significant amount of increased T2 signal suggesting fluid around her ischial tuberosity, as well as proximal hamstring origin. This may represent tearing of the hamstring, versus concordant ischial bursitis.  Procedure: Limited ultrasound of of right ischial tuberosity Device: GE logiq E. Findings: There is edema, and incongruity of the proximal hamstring tendons.   Impression:  These findings are suggestive of right-sided ischial bursitis.  Images permanently stored in the unit and are available for review.  Procedure:  Injection of right ischial bursa Consent obtained and verified. Time-out conducted. Noted no overlying erythema, induration, or other signs of local infection. Skin prepped in a sterile fashion. Topical analgesic spray: Ethyl chloride. Completed without difficulty. Meds: 1 cc Kenalog 40, 5 cc lidocaine, needle advanced without guidance, needle was advanced, I then tapped the needle lightly on the ischial tuberosity, at that point all of the medicine was injected in a fanlike pattern around the ischial tuberosity. Pain immediately improved suggesting accurate placement of the medication. Advised to call if fevers/chills, erythema, induration, drainage, or persistent bleeding.   Impression and Recommendations:

## 2012-03-26 NOTE — Assessment & Plan Note (Signed)
After review of her MRI, as well as assessment of her symptoms I do think that her symptoms are related more to an ischial bursitis than the actual hamstring tear itself. She does decline physical therapy. I injected her ischial bursa today, pain relief suggested accurately for the medication. We'll try some meloxicam. I've also given her some home rehabilitation exercises at her daughter will help her with. I will see her back in 4 weeks, if no better we may have to find a way for her to do formal therapy, home therapy might be an option.  Of note her daughter did request some additional pain medication for her painful trigger point during the visit.

## 2012-03-26 NOTE — Telephone Encounter (Signed)
done

## 2012-03-26 NOTE — Progress Notes (Signed)
Subjective:    Patient ID: [name], female    DOB: [birth year], 78 y.o.   MRN: [id number]  HPI Patient presents to the clinic to followup on hypertension. Patient's blood pressure remains very high despite being on metoprolol, Norvasc, Lasix, and Diovan. We could potentially go up on metoprolol but I fear her pulse would drop too low and patient is already feeling fatigued and blah. We did also increase Norvasc however patient. He has lower extremity swelling and to potentially make this a side effect. She denies any chest pains, palpitations, shortness of breath. She has had some dizziness when she lays on her left side and she's been having daily headaches in her right temple region and extending into her right parietal region of her head. Excedrin has been helping her headaches. She has been diagnosed with distal internal carotid artery aneurysm that we are watching and trying to get her blood pressure in normal range so that it will not rupture. Patient is already considered legally blind.  She also complains of a itchy scaly rash on her right ear. She does have a history of psoriasis. She has not tried anything to make better and does not know of anything that makes worse.   Patient has developed a cough for the last 48 hours. She does complain of some sinus pressure and mild ear pain. She denies that cough is productive. She has felt congested in her sinus region. Patient has had 22 documented cases of pneumonia when she was being seen at 2. At one point he had her on antibiotics for one week every month to prevent pneumonia.   Review of Systems  Constitutional: Negative.   HENT: Positive for ear pain, congestion and sinus pressure.   Eyes: Negative.   Respiratory: Positive for cough. Negative for wheezing.        Objective:   Physical Exam  Constitutional: She appears well-developed and well-nourished.  HENT:  Head: Normocephalic and atraumatic.  Left Ear: External ear normal.    Nose: Nose normal.  Mouth/Throat: Oropharynx is clear and moist. No oropharyngeal exudate.       Tenderness to palpation on the right temporalis region. There is what appears to be scaling in that area.  Right external ear/low scaly with base of erythema. No plaques present.  Eyes:       Patient is considered legally blind.  Cardiovascular: Normal rate, regular rhythm and normal heart sounds.   Pulmonary/Chest: Effort normal and breath sounds normal. She has no wheezes.  Neurological: A cranial nerve deficit is present.  Skin:       See. HEENT  Psychiatric: She has a normal mood and affect. Her behavior is normal.          Assessment & Plan:  Uncontrolled hypertension-place patient on clonidine patch change weekly. Educated patient about rebound hypertension and the importance of changing the patch weekly. The patient on all other blood pressure medication. Blood pressure goal would be in the 140. Will get home health to give blood pressure reading weekly. Recheck blood pressure in office in one month.  Headache-patient has tenderness over the right temple region and headaches into the parietal region on the right side. Will check ESR today. Only to rule out giant cell arteritis. Since the most dangerous and pulling would be blindness and patient is already considered legally blind am not sure how much testing we should put patient through in order to get confirm diagnosis. If ESR is positive I will consult with  Dr. Glade Lloyd. As of now continue using Excedrin as needed for her headaches.  Seborrheic dermatitis-I. did give Nizoral cream to apply twice daily to right ear on the affected region only. Patient is to use for 2 weeks and call if not improving.   URI-patient has an extensive history of developing pneumonia. When she was seen at [facility] she had 22 cases of pneumonia. I will give Augmentin for 10 days twice a day to start now. Call if symptoms worsen.

## 2012-03-26 NOTE — Patient Instructions (Addendum)
clonadine patch change weekly for blood pressure. Home Health to call in weekly BP readings. Follow up in 1 month in OV.  Augmentin for URI. Due to hx of pneumonia.  Nizoral cream to use twice a day for 2-4 weeks. Call if not improving.   Will get ESR to evaluate headache.

## 2012-03-26 NOTE — Telephone Encounter (Signed)
Will you call home health and make sure they report blood pressure readings from visit. I want to make sure BP goes down after starting clonidine.

## 2012-04-01 ENCOUNTER — Telehealth: Payer: Self-pay | Admitting: *Deleted

## 2012-04-01 NOTE — Telephone Encounter (Signed)
Stop her mobic. It may be increasing her BP. Also increase metoprolol to 2 tabs.

## 2012-04-01 NOTE — Telephone Encounter (Signed)
Daughter informed

## 2012-04-01 NOTE — Telephone Encounter (Signed)
Called daughter Toniann Fail at [phone] and [facility] for her to return call for new med changes.

## 2012-04-01 NOTE — Telephone Encounter (Signed)
She has called to inform you that pt's BP is 190/93 and pt c/o HA rating pain 5/10 with no other symptoms. States pt has taken Excedrin but she wanted to inform you.

## 2012-04-08 ENCOUNTER — Telehealth: Payer: Self-pay | Admitting: *Deleted

## 2012-04-08 NOTE — Telephone Encounter (Signed)
Pt is also having dizziness.

## 2012-04-08 NOTE — Telephone Encounter (Signed)
Home health nurse [phone] called with update on B/P today- 161/91 pulse 99 Has OV tomorrow afternoon to see you.  Home Health asking for Home Health order for CNA to see patient

## 2012-04-08 NOTE — Telephone Encounter (Signed)
Daughter states that pt has aneurysm in her main artery. Daughter has appt in am with [name] so I schedule for pt to come in also right before daughter's appt to see Jade. States her resp symptoms are better and the HAs started before Clonindine patch.

## 2012-04-08 NOTE — Telephone Encounter (Signed)
She has called stating pt's VS today is 161/91, HR 99, Resp 18, T 96.6, 98% O2 and 146lbs. States pt is having more HAs than she did last week when she saw [place]. She is concerned of wether or not pt needs to come back in for appt. Please advise.

## 2012-04-08 NOTE — Telephone Encounter (Signed)
Is she still having respiratory sxs or does she feel better on the augmentin.  Did the HA start before starting the clonidone patch?  She may need an appt.

## 2012-04-09 ENCOUNTER — Encounter: Payer: Self-pay | Admitting: Physician Assistant

## 2012-04-09 ENCOUNTER — Ambulatory Visit (INDEPENDENT_AMBULATORY_CARE_PROVIDER_SITE_OTHER): Payer: Medicare Other | Admitting: Physician Assistant

## 2012-04-09 VITALS — BP 147/86 | HR 87 | Ht 60.0 in | Wt 146.0 lb

## 2012-04-09 DIAGNOSIS — R51 Headache: Secondary | ICD-10-CM

## 2012-04-09 DIAGNOSIS — R5383 Other fatigue: Secondary | ICD-10-CM

## 2012-04-09 DIAGNOSIS — R531 Weakness: Secondary | ICD-10-CM

## 2012-04-09 DIAGNOSIS — I10 Essential (primary) hypertension: Secondary | ICD-10-CM

## 2012-04-09 DIAGNOSIS — R42 Dizziness and giddiness: Secondary | ICD-10-CM

## 2012-04-09 NOTE — Patient Instructions (Signed)
Will call with labs.  

## 2012-04-09 NOTE — Progress Notes (Signed)
  Subjective:    Patient ID: [name], female    DOB: 1919-04-13, 78 y.o.   MRN: [id number]  HPI Patient is a 78 yo female who presents to the clinic to follow up on HTN and to discuss ongoing headache. Headaches are always on the right side of her head and extend into her temporal region. I ordered an ESR at last visit and was elevated but not by much. I do not suspect Temporal Arthritis but we continue to follow. Headache is almost every day but does not hurt in the morning usually starts in afternoon. Rates 7/8 on pain scale with excedrin goes to 5/10. She is legally blind but no changes in vision. She does not get nauseated or vomits. She does feel sensitive to light and puts her sunglasses on. Pt's BP are up and down as home healthcare continues to monitor seems to be staying around 160/90 ish. Denies any CP, palpitations. She does have some ongoing SOB. Still on Augmentin. Still feeling weak and occasionally dizzy. Denies fever, chills.   Review of Systems     Objective:   Physical Exam  Constitutional: She is oriented to person, place, and time. She appears well-developed and well-nourished.  HENT:  Head: Normocephalic and atraumatic.  Cardiovascular: Normal rate, regular rhythm and normal heart sounds.   Pulmonary/Chest: Effort normal and breath sounds normal.       Coarse Breath Sounds.  Neurological: She is alert and oriented to person, place, and time.  Skin: Skin is warm and dry.  Psychiatric: She has a normal mood and affect. Her behavior is normal.          Assessment & Plan:  Headaches- will order another ESR to make sure not trending up. Will consider starting a tripan at onset but I want to control BP before taking that route and making sure that we do not need to do any more investigation into temporal artheritis.   HTN-will wait to make any changes unitl we get more readings from home health and bloodwork back.   Weak/Dizzy-Will order a CBC to make sure no acute  changes. Suspect might be related to changing BP meds and ongoing HA's.

## 2012-04-10 ENCOUNTER — Telehealth: Payer: Self-pay | Admitting: *Deleted

## 2012-04-10 LAB — CBC WITH DIFFERENTIAL/PLATELET
Basophils Absolute: 0.1 10*3/uL (ref 0.0–0.1)
Basophils Relative: 0 % (ref 0–1)
Eosinophils Absolute: 0.2 10*3/uL (ref 0.0–0.7)
Eosinophils Relative: 2 % (ref 0–5)
Lymphs Abs: 2.6 10*3/uL (ref 0.7–4.0)
MCH: 27.1 pg (ref 26.0–34.0)
MCV: 82.8 fL (ref 78.0–100.0)
Neutrophils Relative %: 74 % (ref 43–77)
Platelets: 372 10*3/uL (ref 150–400)
RBC: 4.54 MIL/uL (ref 3.87–5.11)
RDW: 15.3 % (ref 11.5–15.5)

## 2012-04-10 LAB — SEDIMENTATION RATE: Sed Rate: 30 mm/hr — ABNORMAL HIGH (ref 0–22)

## 2012-04-10 NOTE — Telephone Encounter (Signed)
Daughter called this AM asking if you planned on sending a Rx for migraines. I didn't see anything documented in chart. Please advise.

## 2012-04-11 MED ORDER — PANTOPRAZOLE SODIUM 40 MG PO TBEC: 40.0000 mg | DELAYED_RELEASE_TABLET | Freq: Every day | ORAL | Status: DC

## 2012-04-11 MED ORDER — AZITHROMYCIN 250 MG PO TABS: ORAL_TABLET | ORAL | Status: DC

## 2012-04-11 MED ORDER — CLONIDINE HCL 0.2 MG/24HR TD PTWK: 1.0000 | MEDICATED_PATCH | TRANSDERMAL | Status: DC

## 2012-04-11 MED ORDER — METOPROLOL SUCCINATE ER 100 MG PO TB24: 100.0000 mg | ORAL_TABLET | Freq: Every day | ORAL | Status: DC

## 2012-04-11 NOTE — Telephone Encounter (Signed)
Pt was called. Result not gives clear description of changes made.

## 2012-04-11 NOTE — Telephone Encounter (Signed)
Ok for home health order for CNA.

## 2012-04-11 NOTE — Telephone Encounter (Signed)
[name] from [facility] called stating Pt's BP today was 160/76 P was 82. Pt does have slight HA on the top of her head. [name] will be DCing "low vision services" next week but nursing will continue.

## 2012-04-11 NOTE — Telephone Encounter (Signed)
[name] informed of order for CNA

## 2012-04-24 ENCOUNTER — Telehealth: Payer: Self-pay | Admitting: *Deleted

## 2012-04-24 NOTE — Telephone Encounter (Signed)
They have called stating pt's VS are wt: 147.6, 158/50, 60, 96% and 98.0 with 18 resp. They are also asking if the can recertify pt for SN and [facility] Aide. SN weekly and HH Aide 2 times a week. States will be going to daughter's house from the 14-18th. Please advise on recert orders and BP.

## 2012-04-24 NOTE — Telephone Encounter (Signed)
OK for recert.  BP is OK.

## 2012-04-25 ENCOUNTER — Ambulatory Visit: Payer: Medicare Other | Admitting: Sports Medicine

## 2012-04-25 NOTE — Telephone Encounter (Signed)
Notified Mardene Celeste. Verbal orders given.

## 2012-04-28 ENCOUNTER — Ambulatory Visit: Payer: Medicare Other | Admitting: Physician Assistant

## 2012-04-30 ENCOUNTER — Encounter: Payer: Self-pay | Admitting: Physician Assistant

## 2012-04-30 ENCOUNTER — Other Ambulatory Visit: Payer: Self-pay | Admitting: *Deleted

## 2012-04-30 ENCOUNTER — Ambulatory Visit (INDEPENDENT_AMBULATORY_CARE_PROVIDER_SITE_OTHER): Payer: Medicare Other | Admitting: Physician Assistant

## 2012-04-30 VITALS — BP 137/54 | HR 56 | Temp 98.4°F | Ht 60.0 in | Wt 149.0 lb

## 2012-04-30 DIAGNOSIS — J029 Acute pharyngitis, unspecified: Secondary | ICD-10-CM

## 2012-04-30 MED ORDER — AMOXICILLIN-POT CLAVULANATE 875-125 MG PO TABS: 1.0000 | ORAL_TABLET | Freq: Two times a day (BID) | ORAL | Status: DC

## 2012-04-30 MED ORDER — FUROSEMIDE 40 MG PO TABS: 40.0000 mg | ORAL_TABLET | Freq: Every day | ORAL | Status: DC

## 2012-04-30 NOTE — Progress Notes (Signed)
  Subjective:    Patient ID: [name], female    DOB: 25-Nov-1918, 78 y.o.   MRN: [id number]  HPI Patient is a 78 yo female who presents to the clinic with sore throat for 3 days. She has had some ear pain bilaterally also. Denies any fever. Her cough is dry and she has had a off and on again headache that she has used excedrin for and has helped. She denies any sinus pressure or strep throat exposures.    Her blood pressure is great today. I think we have regulated it with clonidine. Denies any CP, palpitations, SOB, or vision changes.   She did notice a bruise on her left leg. It does continue to improve. No pain or swelling associated. Denies any recent long distance travel, new medications, or surgeries.   Review of Systems     Objective:   Physical Exam  Constitutional: She is oriented to person, place, and time. She appears well-developed and well-nourished.  HENT:  Head: Normocephalic and atraumatic.  Right Ear: External ear normal.  Left Ear: External ear normal.  Nose: Nose normal.  Mouth/Throat: No oropharyngeal exudate.       TM's normal bilaterally. Oropharynx erythematous but not swollen and no exudate. Negative for maxillary or frontal tenderness.  Eyes: Conjunctivae normal are normal.  Neck: Normal range of motion. Neck supple.  Cardiovascular: Normal rate, regular rhythm and normal heart sounds.   Pulmonary/Chest: Effort normal and breath sounds normal. She has no wheezes.  Lymphadenopathy:    She has no cervical adenopathy.  Neurological: She is alert and oriented to person, place, and time.  Skin:       Left anterior leg mid ways down between knee and ankle area of bruising that is blue and black. No swelling, redness, warmth coming from area. Not opened or oozing.   Psychiatric: She has a normal mood and affect. Her behavior is normal.          Assessment & Plan:  Pharyngitis- Rapid strep negative. I suspect that this is viral in origin and that she  should try symptomatic treatment for at least 24-48 more hours. However, if continues to get worse then can start Augmentin for 10 days. Reminded patient of gargling with salt water, honey/lemon lozengers. Tylenol of pain relief since already taking Mobic daily.   HTN- Looking great today. Continue with current treatment. Follow up in 3 months.  Bruising- Per pt seems to be getting better. No signs of clot or swelling. Encouraged patient to continue monitoring. If she noticing any changes with swelling, feeling hot, increasing in size then call office. I suspect that due to age and meds have put her easier to bleed and will resolve with time.  Pt declined flu shot due to not feeling well today.

## 2012-04-30 NOTE — Patient Instructions (Addendum)
Pick up Augmentin at pharmacy.  Call if left leg does not continue to improve. If starts to swell or increases in pain. Let me know and will order some testing.   Viral and Bacterial Pharyngitis Pharyngitis is soreness (inflammation) or infection of the pharynx. It is also called a sore throat. CAUSES  Most sore throats are caused by viruses and are part of a cold. However, some sore throats are caused by strep and other bacteria. Sore throats can also be caused by post nasal drip from draining sinuses, allergies and sometimes from sleeping with an open mouth. Infectious sore throats can be spread from person to person by coughing, sneezing and sharing cups or eating utensils. TREATMENT  Sore throats that are viral usually last 3-4 days. Viral illness will get better without medications (antibiotics). Strep throat and other bacterial infections will usually begin to get better about 24-48 hours after you begin to take antibiotics. HOME CARE INSTRUCTIONS   If the caregiver feels there is a bacterial infection or if there is a positive strep test, they will prescribe an antibiotic. The full course of antibiotics must be taken. If the full course of antibiotic is not taken, you or your child may become ill again. If you or your child has strep throat and do not finish all of the medication, serious heart or kidney diseases may develop.  Drink enough water and fluids to keep your urine clear or pale yellow.  Only take over-the-counter or prescription medicines for pain, discomfort or fever as directed by your caregiver.  Get lots of rest.  Gargle with salt water ( tsp. of salt in a glass of water) as often as every 1-2 hours as you need for comfort.  Hard candies may soothe the throat if individual is not at risk for choking. Throat sprays or lozenges may also be used. SEEK MEDICAL CARE IF:   Large, tender lumps in the neck develop.  A rash develops.  Green, yellow-brown or bloody sputum is  coughed up.  Your baby is older than 3 months with a rectal temperature of 100.5 F (38.1 C) or higher for more than 1 day. SEEK IMMEDIATE MEDICAL CARE IF:   A stiff neck develops.  You or your child are drooling or unable to swallow liquids.  You or your child are vomiting, unable to keep medications or liquids down.  You or your child has severe pain, unrelieved with recommended medications.  You or your child are having difficulty breathing (not due to stuffy nose).  You or your child are unable to fully open your mouth.  You or your child develop redness, swelling, or severe pain anywhere on the neck.  You have a fever.  Your baby is older than 3 months with a rectal temperature of 102 F (38.9 C) or higher.  Your baby is 44 months old or younger with a rectal temperature of 100.4 F (38 C) or higher. MAKE SURE YOU:   Understand these instructions.  Will watch your condition.  Will get help right away if you are not doing well or get worse. Document Released: 07/09/2005 Document Revised: 10/01/2011 Document Reviewed: 10/06/2007 [facility] Patient Information 2013 [place], [place].

## 2012-05-06 DIAGNOSIS — I2510 Atherosclerotic heart disease of native coronary artery without angina pectoris: Secondary | ICD-10-CM

## 2012-05-06 DIAGNOSIS — I10 Essential (primary) hypertension: Secondary | ICD-10-CM

## 2012-05-06 DIAGNOSIS — J449 Chronic obstructive pulmonary disease, unspecified: Secondary | ICD-10-CM

## 2012-05-06 DIAGNOSIS — J8410 Pulmonary fibrosis, unspecified: Secondary | ICD-10-CM

## 2012-05-09 ENCOUNTER — Other Ambulatory Visit: Payer: Self-pay | Admitting: *Deleted

## 2012-05-09 MED ORDER — AMLODIPINE BESYLATE 5 MG PO TABS
5.0000 mg | ORAL_TABLET | Freq: Every day | ORAL | Status: DC
Start: 2012-05-09 — End: 2012-05-09

## 2012-05-09 MED ORDER — AMLODIPINE BESYLATE 5 MG PO TABS
5.0000 mg | ORAL_TABLET | Freq: Every day | ORAL | Status: DC
Start: 2012-05-09 — End: 2012-06-04

## 2012-05-12 ENCOUNTER — Telehealth: Payer: Self-pay | Admitting: *Deleted

## 2012-05-12 NOTE — Telephone Encounter (Signed)
Daughter calls stating Pt is no better and would like 2nd round of ABX sent to pharm. Daughter states that it usually takes 2 rounds of ABX for Pt to get well. Please advise.

## 2012-05-12 NOTE — Telephone Encounter (Signed)
Daughter aware and will CB to schedule OV

## 2012-05-12 NOTE — Telephone Encounter (Signed)
I need to make sure she doesn't have pneumonia. I sent over first abx but need to listen to her and get chest x-ray before deciding which abx is best. Please come in today.

## 2012-05-13 ENCOUNTER — Telehealth: Payer: Self-pay | Admitting: *Deleted

## 2012-05-13 NOTE — Telephone Encounter (Signed)
Nurse with [facility] calls to give update on pt. Their protocol is to call with update at every home visit. Patient BP today waas 114/77 with a pulse of 65. Respirations 18 O2 94% on room air. No c/o of headache today

## 2012-05-14 ENCOUNTER — Telehealth: Payer: Self-pay | Admitting: Physician Assistant

## 2012-05-14 ENCOUNTER — Encounter: Payer: Self-pay | Admitting: Physician Assistant

## 2012-05-14 ENCOUNTER — Ambulatory Visit (INDEPENDENT_AMBULATORY_CARE_PROVIDER_SITE_OTHER): Payer: Medicare Other | Admitting: Physician Assistant

## 2012-05-14 ENCOUNTER — Ambulatory Visit (INDEPENDENT_AMBULATORY_CARE_PROVIDER_SITE_OTHER): Payer: Medicare Other

## 2012-05-14 VITALS — BP 145/77 | HR 62 | Temp 97.8°F | Ht 60.0 in | Wt 145.0 lb

## 2012-05-14 DIAGNOSIS — J441 Chronic obstructive pulmonary disease with (acute) exacerbation: Secondary | ICD-10-CM

## 2012-05-14 DIAGNOSIS — R0602 Shortness of breath: Secondary | ICD-10-CM

## 2012-05-14 DIAGNOSIS — R51 Headache: Secondary | ICD-10-CM

## 2012-05-14 DIAGNOSIS — R05 Cough: Secondary | ICD-10-CM

## 2012-05-14 MED ORDER — DOXYCYCLINE HYCLATE 100 MG PO CAPS: 100.0000 mg | ORAL_CAPSULE | Freq: Two times a day (BID) | ORAL | Status: DC

## 2012-05-14 MED ORDER — TOPIRAMATE 25 MG PO TABS: 25.0000 mg | ORAL_TABLET | Freq: Every day | ORAL | Status: DC

## 2012-05-14 MED ORDER — CLONIDINE HCL 0.2 MG/24HR TD PTWK: 1.0000 | MEDICATED_PATCH | TRANSDERMAL | Status: DC

## 2012-05-14 NOTE — Telephone Encounter (Signed)
Could you call home health and have the evaluate the need for nebulizer. Pt should have one but may need to be taught how to use. She has been prescribed nebulizer solution in the past. I think her trying this may help with SOB and cough.

## 2012-05-14 NOTE — Patient Instructions (Addendum)
Call office if not improved by Friday and will consider prednisone burst.   Will talk to home health about nebulizer.

## 2012-05-14 NOTE — Progress Notes (Signed)
  Subjective:    Patient ID: [name], female    DOB: 09/13/1918, 78 y.o.   MRN: [id number]  HPI Patient presents to clinic to discuss ongoing cough and URI symptoms. I called in abx of augmentin for patient 2 weeks ago. Pt did initially get better but has started to feel bad again. She coughs a lot and feels very congested. Her cough is very productive. She has some sinus pressure. Denies fever, chills, ST, or ear pain. Reports ongoing headache. She does not use nebulizer because she feels it does not do her any good and right now she cannot find hers. She refuses to use any inhalers because she can't use them right.  She continues to have daily headaches. We have controlled headaches much better with clonidine and her headaches remain. She is already on Toprol highest dose. Excedrin only helps a little with pain.    Review of Systems     Objective:   Physical Exam  Constitutional: She is oriented to person, place, and time. She appears well-developed and well-nourished.  HENT:  Head: Normocephalic and atraumatic.  Eyes: Conjunctivae normal are normal.  Neck: Normal range of motion. Neck supple. No thyromegaly present.  Cardiovascular: Normal rate, regular rhythm and normal heart sounds.   Pulmonary/Chest: Effort normal.       Coarse breath sounds. Negative for wheezing.  Neurological: She is oriented to person, place, and time.  Skin: Skin is dry.  Psychiatric: She has a normal mood and affect. Her behavior is normal.          Assessment & Plan:  COPD exacerbation- I did get chest x-ray to evaluate for pneumonia with her extensive history. X-ray negative for pneumonia but did show signs of COPD. Discussed with patient inhalers and she refuses to use any type of inhaler. I discuss nebulizer and that it might help with some of her symptoms since her lungs have a lot of inflammation. I will send not to home health for assessment and see if we can help her find neb or get a new one  and see if that helps some of her symptoms. I also gave a round of doxy for 10 days. Pulse ox is stable at 94 same as home health yesterday. We could consider steroid shot if not improving or burst of prednisone. Follow up/call office  if not improving.   Headaches- I had suspected that headaches where coming from BP> now that we have BP controlled and headaches remain I would like to try topamax 25mg  at bedtime. Pt is already on BB. Discussed with patient that we can increase as needed. Warned of dizziness or sleepiness with topamax. If starts to feel down or any behavior changes pt's daughter is aware to call us. Follow up in 6-8 weeks.

## 2012-05-16 NOTE — Telephone Encounter (Signed)
LMOM informing Joann for [employer]. Ask to CB if she needed anything.

## 2012-05-19 ENCOUNTER — Telehealth: Payer: Self-pay | Admitting: *Deleted

## 2012-05-19 MED ORDER — AMBULATORY NON FORMULARY MEDICATION: Status: DC

## 2012-05-19 NOTE — Telephone Encounter (Signed)
Ok to send rx to home care for nebulizer.

## 2012-05-19 NOTE — Telephone Encounter (Signed)
Nurse with [facility] states that patient does not have a nebulizer machine. She needs a rx for one or however you order them for the home so they can educate her on how to use it

## 2012-05-19 NOTE — Telephone Encounter (Signed)
Rx printed and pt notiifed via VM

## 2012-05-21 ENCOUNTER — Telehealth: Payer: Self-pay | Admitting: *Deleted

## 2012-05-21 ENCOUNTER — Other Ambulatory Visit: Payer: Self-pay | Admitting: Physician Assistant

## 2012-05-21 MED ORDER — IPRATROPIUM-ALBUTEROL 0.5-2.5 (3) MG/3ML IN SOLN: 3.0000 mL | RESPIRATORY_TRACT | Status: DC | PRN

## 2012-05-21 MED ORDER — FORMOTEROL FUMARATE 20 MCG/2ML IN NEBU: 20.0000 ug | INHALATION_SOLUTION | Freq: Two times a day (BID) | RESPIRATORY_TRACT | Status: DC

## 2012-05-21 NOTE — Telephone Encounter (Signed)
Please send medicine for neb to [facility] [place].

## 2012-05-21 NOTE — Telephone Encounter (Signed)
Sent order for nebulizer. I think she would benefit for treatment. Can we find out about when patient might get this?

## 2012-05-21 NOTE — Telephone Encounter (Signed)
Nurse with [facility] calls with update. CNA went out yesterday to help with bath and pt vitals were 189/92  P=52    Wt.=142.9    O2= 91% room air. Pt was not wearing oxygen and slight SOB. Nurse spoke with Toniann Fail (daughter) this morning and pt is some better- nurse instructed daughter to have pt wear O2 when feels SOB and recheck O2 levels 30 minutes after putting on O2. No nausea or vomiting today.  FYI

## 2012-05-26 ENCOUNTER — Telehealth: Payer: Self-pay | Admitting: *Deleted

## 2012-05-26 NOTE — Telephone Encounter (Signed)
Let's have her make an appointment if she's able to get transportation here.

## 2012-05-26 NOTE — Telephone Encounter (Signed)
[facility] called and states that patient feels dizzy and unsteady (Friday call) B/P 165/74, P65 Ph [phone]

## 2012-05-26 NOTE — Telephone Encounter (Signed)
Daughter notififed and will schedule appt for Wednesday the only day she can get her here

## 2012-05-27 ENCOUNTER — Telehealth: Payer: Self-pay | Admitting: *Deleted

## 2012-05-27 NOTE — Telephone Encounter (Signed)
This is a pt of Jade's. Johnny states that pt's BP 158/80, P 60 Resp 20 T 97.2 o@ 98% Wt 143.8. Went over neb tx and BP instructions. They are requesting additional visits for 3 more weeks due to [facility]. Please advise if I can give additional visits.

## 2012-05-27 NOTE — Telephone Encounter (Signed)
Amen, to 3 additional weeks.

## 2012-05-27 NOTE — Telephone Encounter (Signed)
[facility] nurse Mardella Layman called for a f/u on patient VS 133/69 Pulse 67 Some Dizziness today but feeling a little better

## 2012-05-27 NOTE — Telephone Encounter (Signed)
LMOM that they can do 3 additional weeks.

## 2012-05-28 ENCOUNTER — Ambulatory Visit (INDEPENDENT_AMBULATORY_CARE_PROVIDER_SITE_OTHER): Payer: Medicare Other | Admitting: Physician Assistant

## 2012-05-28 ENCOUNTER — Encounter: Payer: Self-pay | Admitting: Physician Assistant

## 2012-05-28 VITALS — BP 100/59 | HR 63 | Temp 97.6°F

## 2012-05-28 DIAGNOSIS — I959 Hypotension, unspecified: Secondary | ICD-10-CM

## 2012-05-28 DIAGNOSIS — M76899 Other specified enthesopathies of unspecified lower limb, excluding foot: Secondary | ICD-10-CM

## 2012-05-28 DIAGNOSIS — R42 Dizziness and giddiness: Secondary | ICD-10-CM

## 2012-05-28 DIAGNOSIS — R1013 Epigastric pain: Secondary | ICD-10-CM

## 2012-05-28 DIAGNOSIS — M7070 Other bursitis of hip, unspecified hip: Secondary | ICD-10-CM

## 2012-05-28 DIAGNOSIS — K219 Gastro-esophageal reflux disease without esophagitis: Secondary | ICD-10-CM

## 2012-05-28 MED ORDER — CLONIDINE HCL 0.1 MG/24HR TD PTWK: 1.0000 | MEDICATED_PATCH | TRANSDERMAL | Status: DC

## 2012-05-28 MED ORDER — TRAMADOL HCL 50 MG PO TABS: 50.0000 mg | ORAL_TABLET | Freq: Three times a day (TID) | ORAL | Status: DC | PRN

## 2012-05-28 NOTE — Patient Instructions (Addendum)
Boost or Ensure drinks for nutrition.   Stop protonix and start dexilant daily.   Decreasing your Clonidine to .1mg  weekly.   Make sure taking Mobic with food can hurt stomach.   Will check for certain bacteria.

## 2012-05-29 ENCOUNTER — Other Ambulatory Visit: Payer: Self-pay | Admitting: *Deleted

## 2012-05-29 DIAGNOSIS — M7070 Other bursitis of hip, unspecified hip: Secondary | ICD-10-CM

## 2012-05-29 MED ORDER — MELOXICAM 15 MG PO TABS: 15.0000 mg | ORAL_TABLET | Freq: Every day | ORAL | Status: DC | PRN

## 2012-05-30 ENCOUNTER — Telehealth: Payer: Self-pay | Admitting: *Deleted

## 2012-05-30 ENCOUNTER — Telehealth: Payer: Self-pay | Admitting: Physician Assistant

## 2012-05-30 ENCOUNTER — Other Ambulatory Visit: Payer: Self-pay | Admitting: Physician Assistant

## 2012-05-30 DIAGNOSIS — K219 Gastro-esophageal reflux disease without esophagitis: Secondary | ICD-10-CM | POA: Insufficient documentation

## 2012-05-30 MED ORDER — AMOXICILL-CLARITHRO-LANSOPRAZ PO MISC: Freq: Two times a day (BID) | ORAL | Status: DC

## 2012-05-30 NOTE — Progress Notes (Signed)
  Subjective:    Patient ID: [name], female    DOB: 04-Sep-1918, 78 y.o.   MRN: [id number]  HPI Patient presents to the clinic because she has not had any appetite and felt very dizzy for the past week. Her stomach hurts every time she eats or drinks. She is currently taking protonix and it is not helping. She has a long hx of acid reflux. Her dizziness is everytime she moves especially when she gets up to walk. She denies any falls. Her clonadine was recently increased due to BP elevation. She denies any fever, cough, sinus pressure or ear pain. Her headaches have been much better. She has not had any N/V/D.   Gallbladder has been removed.    Review of Systems     Objective:   Physical Exam  Constitutional: She is oriented to person, place, and time. She appears well-developed and well-nourished.       Appears weaker today than usual. Not able to ambulate in wheelchair.   HENT:  Head: Normocephalic and atraumatic.       Moist mucous membranes.  Eyes: Conjunctivae normal are normal.  Neck: Normal range of motion. Neck supple.  Cardiovascular: Normal rate, regular rhythm and normal heart sounds.   Pulmonary/Chest: Effort normal and breath sounds normal. She has no wheezes.  Abdominal: Soft. Bowel sounds are normal. She exhibits no distension. There is tenderness.       Mild tenderness over epigastric area and RUQ.   Does not have gallbladder.  Lymphadenopathy:    She has no cervical adenopathy.  Neurological: She is alert and oriented to person, place, and time.  Skin: Skin is warm and dry.       Capillary refill on finger nails is less than 3 sec. Fair skin tugor.    Psychiatric: She has a normal mood and affect. Her behavior is normal.          Assessment & Plan:  Dizziness- I suspect that dizziness is coming from low BP. Decreased dose of clonidine to .1mg . Home Health will continue to monitor. Patient and pt's daughter aware that staying hydrated and nourished can also  effect dizziness. Since she is not able to eat consider drinking Boost or Ensure. Also discussed decreasing fall risk by using walker or wheelchair all of the time until feeling better.   Abdominal pain/GERD- Stop Protonix. Gave samples of Dexilant to try. Will order h.pylori testing. Will call with results. STay away from foods that aggravate symptoms. Discussed that mobic might be making worse. Make sure taking mobic with food. May need to consider stopping mobic for a few days to see if makes stomach problems better.   Ischial bursitis- I know pt on mobic for this but worried might be worsening GI symptoms. Stop or cut back and make sure always taking with food. I did give patient some Tramadol today to use for break through pain. She does not want to try any PT. She did get relief temporary from injection. I did mention that she could follow up with Dr. Karie Schwalbe for another injection.

## 2012-05-30 NOTE — Telephone Encounter (Signed)
Spoke with daughter Toniann Fail and daughter states that she will take her by in the am if she is not acting any better

## 2012-05-30 NOTE — Telephone Encounter (Signed)
[facility] calls with report of pt vitals while in for home visit today. BP= 165/78  P= 60  O2=93%. Pt c/o weak and not feeling good today, CNA states that once bath complete pt went right to sleep.

## 2012-05-30 NOTE — Telephone Encounter (Signed)
Patient daughter-wendy has requested that [facility] call her back about her [name]

## 2012-05-30 NOTE — Telephone Encounter (Signed)
Spoke with daughter. Pt worried because laying in bed and not eating. Do think that not eating is coming from h.pylori infection. Will wait on BP readings this afternoon. May need to go to hospital for IV's.

## 2012-05-30 NOTE — Telephone Encounter (Signed)
Since she has not been eating it sounds like she might be dehydrated. I think they should take patient to ED for IV fluids.

## 2012-06-01 ENCOUNTER — Encounter (HOSPITAL_BASED_OUTPATIENT_CLINIC_OR_DEPARTMENT_OTHER): Payer: Self-pay | Admitting: Emergency Medicine

## 2012-06-01 ENCOUNTER — Emergency Department (HOSPITAL_BASED_OUTPATIENT_CLINIC_OR_DEPARTMENT_OTHER)
Admission: EM | Admit: 2012-06-01 | Discharge: 2012-06-01 | Disposition: A | Discharge status: Home / Self Care | Payer: Medicare Other | Attending: Emergency Medicine | Admitting: Emergency Medicine

## 2012-06-01 DIAGNOSIS — K259 Gastric ulcer, unspecified as acute or chronic, without hemorrhage or perforation: Secondary | ICD-10-CM | POA: Insufficient documentation

## 2012-06-01 DIAGNOSIS — M6281 Muscle weakness (generalized): Secondary | ICD-10-CM | POA: Insufficient documentation

## 2012-06-01 DIAGNOSIS — E785 Hyperlipidemia, unspecified: Secondary | ICD-10-CM | POA: Insufficient documentation

## 2012-06-01 DIAGNOSIS — H548 Legal blindness, as defined in USA: Secondary | ICD-10-CM | POA: Insufficient documentation

## 2012-06-01 DIAGNOSIS — R110 Nausea: Secondary | ICD-10-CM | POA: Insufficient documentation

## 2012-06-01 DIAGNOSIS — G4733 Obstructive sleep apnea (adult) (pediatric): Secondary | ICD-10-CM | POA: Insufficient documentation

## 2012-06-01 DIAGNOSIS — R531 Weakness: Secondary | ICD-10-CM

## 2012-06-01 DIAGNOSIS — Z79899 Other long term (current) drug therapy: Secondary | ICD-10-CM | POA: Insufficient documentation

## 2012-06-01 DIAGNOSIS — Z7982 Long term (current) use of aspirin: Secondary | ICD-10-CM | POA: Insufficient documentation

## 2012-06-01 DIAGNOSIS — B9681 Helicobacter pylori [H. pylori] as the cause of diseases classified elsewhere: Secondary | ICD-10-CM

## 2012-06-01 DIAGNOSIS — R5381 Other malaise: Secondary | ICD-10-CM | POA: Insufficient documentation

## 2012-06-01 DIAGNOSIS — I10 Essential (primary) hypertension: Secondary | ICD-10-CM | POA: Insufficient documentation

## 2012-06-01 DIAGNOSIS — K219 Gastro-esophageal reflux disease without esophagitis: Secondary | ICD-10-CM | POA: Insufficient documentation

## 2012-06-01 DIAGNOSIS — R5383 Other fatigue: Secondary | ICD-10-CM | POA: Insufficient documentation

## 2012-06-01 DIAGNOSIS — I2510 Atherosclerotic heart disease of native coronary artery without angina pectoris: Secondary | ICD-10-CM | POA: Insufficient documentation

## 2012-06-01 DIAGNOSIS — E860 Dehydration: Secondary | ICD-10-CM | POA: Insufficient documentation

## 2012-06-01 LAB — CBC WITH DIFFERENTIAL/PLATELET
Basophils Absolute: 0.1 10*3/uL (ref 0.0–0.1)
Eosinophils Absolute: 0.3 10*3/uL (ref 0.0–0.7)
Eosinophils Relative: 4 % (ref 0–5)
MCH: 27.4 pg (ref 26.0–34.0)
MCV: 82 fL (ref 78.0–100.0)
Platelets: 352 10*3/uL (ref 150–400)
RDW: 14.5 % (ref 11.5–15.5)

## 2012-06-01 LAB — URINALYSIS, ROUTINE W REFLEX MICROSCOPIC
Bilirubin Urine: NEGATIVE
Glucose, UA: NEGATIVE mg/dL
Ketones, ur: NEGATIVE mg/dL
Leukocytes, UA: NEGATIVE
Protein, ur: NEGATIVE mg/dL

## 2012-06-01 LAB — BASIC METABOLIC PANEL
Calcium: 8.6 mg/dL (ref 8.4–10.5)
Creatinine, Ser: 1.2 mg/dL — ABNORMAL HIGH (ref 0.50–1.10)
GFR calc non Af Amer: 38 mL/min — ABNORMAL LOW (ref >90)
Glucose, Bld: 90 mg/dL (ref 70–99)
Sodium: 133 mEq/L — ABNORMAL LOW (ref 135–145)

## 2012-06-01 MED ORDER — ONDANSETRON HCL 4 MG/2ML IJ SOLN
4.0000 mg | Freq: Once | INTRAMUSCULAR | Status: AC
  Administered 2012-06-01: 4 mg via INTRAVENOUS
  Filled 2012-06-01: qty 2

## 2012-06-01 MED ORDER — SODIUM CHLORIDE 0.9 % IV SOLN: 1000.0000 mL | INTRAVENOUS | Status: DC

## 2012-06-01 MED ORDER — SODIUM CHLORIDE 0.9 % IV SOLN
1000.0000 mL | Freq: Once | INTRAVENOUS | Status: AC
  Administered 2012-06-01: 1000 mL via INTRAVENOUS

## 2012-06-01 NOTE — ED Notes (Signed)
Pt c/o weakness and fatigue since last week after being diagnosed with bacterial stomach infection.  Pt has poor oral intake and decreased energy.  Pt started on antibiotics on Friday.  Was sent here by MD today for IV fluids.

## 2012-06-01 NOTE — ED Provider Notes (Signed)
Medical screening examination/treatment/procedure(s) were performed by non-physician practitioner and as supervising physician I was immediately available for consultation/collaboration.   Adelbert Gaspard, MD 06/01/12 1426 

## 2012-06-01 NOTE — ED Provider Notes (Signed)
History     CSN: [id number]  Arrival date & time 06/01/12  1141   First MD Initiated Contact with Patient 06/01/12 1235      Chief Complaint  Patient presents with  . Abdominal Pain  . Fatigue  . Nausea    (Consider location/radiation/quality/duration/timing/severity/associated sxs/prior treatment) Patient is a 78 y.o. female presenting with weakness. The history is provided by the patient and a relative. No language interpreter was used.  Weakness The primary symptoms include nausea. Primary symptoms do not include altered mental status or dizziness. The symptoms began 5 to 7 days ago. The symptoms are worsening. The neurological symptoms are diffuse. Context: h pylori infection.  Additional symptoms include weakness. Medical issues do not include seizures or cerebral vascular accident. Procedure history comments: positive h pylori.  Pt was diagnosed with hpylori at her primary care office last week.   Pt has had a decreased appetite.   Pt complains of feeling weak.   Pt was sent here for Iv fluids.  Patient is being treated with Prevpac,   Past Medical History  Diagnosis Date  . Hypertension   . Hyperlipidemia   . OSA (obstructive sleep apnea)     not on CPAP  . Bronchiectasis   . Aortic stenosis   . CAD (coronary artery disease)     Present PCI and MI  . GERD (gastroesophageal reflux disease)   . PUD (peptic ulcer disease)   . Legally blind   . Scleroderma     Past Surgical History  Procedure Date  . Cardiac valve replacement 2011  . Coronary artery bypass graft 2011  . Left shoulder surgery   . Corneal transplant   . Cholecystectomy   . Appendectomy   . Abdominal hysterectomy   . Total knee arthroplasty     Family History  Problem Relation Age of Onset  . Heart disease Sister   . Heart disease Brother     MI x 2  . Lung disease Brother     ? dz, was a smoker    History  Substance Use Topics  . Smoking status: Never Smoker   . Smokeless tobacco:  Never Used  . Alcohol Use: No    OB History    Grav Para Term Preterm Abortions TAB SAB Ect Mult Living                  Review of Systems  Gastrointestinal: Positive for nausea.  Neurological: Positive for weakness. Negative for dizziness.  Psychiatric/Behavioral: Negative for altered mental status.  All other systems reviewed and are negative.    Allergies  Levofloxacin  Home Medications   Current Outpatient Rx  Name  Route  Sig  Dispense  Refill  . AMBULATORY NON FORMULARY MEDICATION      Nebulizer machine. Dx: COPD, SOB, cough   1 each   0   . AMITRIPTYLINE HCL 50 MG PO TABS   Oral   Take 1 tablet (50 mg total) by mouth at bedtime.   90 tablet   0   . AMLODIPINE BESYLATE 5 MG PO TABS   Oral   Take 1 tablet (5 mg total) by mouth daily.   30 tablet   0   . AMOXICILL-CLARITHRO-LANSOPRAZ PO MISC   Oral   Take by mouth 2 (two) times daily. Follow package directions.   1 kit   0   . ASPIRIN-ACETAMINOPHEN-CAFFEINE 250-250-65 MG PO TABS   Oral   Take 1 tablet by mouth every 6 (  six) hours as needed.         Marland Kitchen CLONIDINE HCL 0.1 MG/24HR TD PTWK   Transdermal   Place 1 patch (0.1 mg total) onto the skin once a week.   4 patch   3   . CLONIDINE HCL 0.2 MG/24HR TD PTWK   Transdermal   Place 1 patch (0.2 mg total) onto the skin once a week.   12 patch   2   . DOXYCYCLINE HYCLATE 100 MG PO CAPS   Oral   Take 1 capsule (100 mg total) by mouth 2 (two) times daily. For 10 days.   20 capsule   0   . FUROSEMIDE 40 MG PO TABS   Oral   Take 1 tablet (40 mg total) by mouth daily.   30 tablet   0   . ADVIL PM PO   Oral   Take 1 tablet by mouth at bedtime as needed.         . IPRATROPIUM-ALBUTEROL 0.5-2.5 (3) MG/3ML IN SOLN   Nebulization   Take 3 mLs by nebulization every 4 (four) hours as needed.   360 mL   11   . KETOCONAZOLE 2 % EX CREA   Topical   Apply topically 2 (two) times daily. For 2-4 weeks to affected areas.   15 g   0   .  MELATONIN 10 MG PO TABS   Oral   Take 1 tablet by mouth at bedtime.         . MELOXICAM 15 MG PO TABS   Oral   Take 1 tablet (15 mg total) by mouth daily as needed for pain.   30 tablet   3   . METOPROLOL SUCCINATE ER 100 MG PO TB24   Oral   Take 1 tablet (100 mg total) by mouth daily. Take with or immediately following a meal.   90 tablet   0   . PANTOPRAZOLE SODIUM 40 MG PO TBEC   Oral   Take 1 tablet (40 mg total) by mouth daily.   90 tablet   1   . PREDNISOLONE ACETATE 1 % OP SUSP   Both Eyes   Place 1 drop into both eyes daily.   5 mL   4   . TOPIRAMATE 25 MG PO TABS   Oral   Take 1 tablet (25 mg total) by mouth at bedtime.   30 tablet   1   . TRAMADOL HCL 50 MG PO TABS   Oral   Take 1 tablet (50 mg total) by mouth every 8 (eight) hours as needed for pain.   30 tablet   1   . VALSARTAN 320 MG PO TABS   Oral   Take 1 tablet (320 mg total) by mouth daily.   90 tablet   4     BP 163/85  Pulse 55  Temp 98.3 F (36.8 C) (Oral)  Resp 20  SpO2 99%  Physical Exam  Nursing note and vitals reviewed. Constitutional: She appears well-developed and well-nourished.  HENT:  Head: Normocephalic and atraumatic.  Eyes: Pupils are equal, round, and reactive to light.  Neck: Thyromegaly present.  Cardiovascular: Normal rate, normal heart sounds and intact distal pulses.   Pulmonary/Chest: Effort normal and breath sounds normal.  Abdominal: There is no tenderness.  Musculoskeletal: Normal range of motion.  Neurological: She is alert.  Skin: Skin is warm.  Psychiatric: She has a normal mood and affect.    ED Course  Procedures (  including critical care time)   Labs Reviewed  URINALYSIS, ROUTINE W REFLEX MICROSCOPIC  CBC WITH DIFFERENTIAL  BASIC METABOLIC PANEL  LIPASE, BLOOD  TROPONIN I   No results found.   No diagnosis found.    MDM    UA is normal creatinine is slightly elevated at 1.2 sodium is 133 patient has normal white blood cell  count hemoglobin is 11.3 patient received a liter of IV fluids while in the emergency department I reviewed the records from her doctors office. Patient is advised to see her doctor tomorrow for recheck to return to the emergency department if symptoms worsen or change.    Date: 06/01/2012  Rate: 53  Rhythm: sinus bradycardia  QRS Axis: normal  Intervals: normal  ST/T Wave abnormalities: normal  Conduction Disutrbances:first-degree A-V block   Narrative Interpretation:   Old EKG Reviewed: none available   Elson Areas, PA 06/01/12 1409  Lonia Skinner [place], [place] 06/01/12 1410

## 2012-06-04 ENCOUNTER — Other Ambulatory Visit: Payer: Self-pay | Admitting: *Deleted

## 2012-06-04 MED ORDER — TOPIRAMATE 25 MG PO TABS: 25.0000 mg | ORAL_TABLET | Freq: Every day | ORAL | Status: DC

## 2012-06-04 MED ORDER — AMLODIPINE BESYLATE 5 MG PO TABS: 5.0000 mg | ORAL_TABLET | Freq: Every day | ORAL | Status: AC

## 2012-06-06 ENCOUNTER — Telehealth: Payer: Self-pay | Admitting: *Deleted

## 2012-06-06 MED ORDER — CLONIDINE HCL 0.1 MG/24HR TD PTWK: 1.0000 | MEDICATED_PATCH | TRANSDERMAL | Status: DC

## 2012-06-06 NOTE — Telephone Encounter (Signed)
Since O2 went up after bath. It seems that maybe the steam opened up her lungs. She needs to be using her nebulizer treatment as needed for SOB.

## 2012-06-06 NOTE — Telephone Encounter (Signed)
CNA calls to report vitals at her visit. BP 150/76  O2 86% but after bath went up to 96%

## 2012-06-06 NOTE — Telephone Encounter (Signed)
Daughter aware.

## 2012-06-06 NOTE — Telephone Encounter (Signed)
[facility] calls and states there is a possible drug interaction with the Amlodipine 5mg  and the lansoprazole amoxicillan. Please advise

## 2012-06-06 NOTE — Telephone Encounter (Signed)
This was filled at local pharmacy and I believe that pt has almost completed therapy. [facility] does not need to refill.

## 2012-06-09 ENCOUNTER — Telehealth: Payer: Self-pay | Admitting: *Deleted

## 2012-06-09 NOTE — Telephone Encounter (Signed)
Nurse with [facility] calls with update on pt vital signs at visit today. BP=116-99 Resp.= 18  Pulse=87 Temp.=97.3 and O2= 96%. Pain scale 1/10 and no complaints today

## 2012-06-11 ENCOUNTER — Telehealth: Payer: Self-pay | Admitting: *Deleted

## 2012-06-11 NOTE — Telephone Encounter (Signed)
[facility] and wants to know if they should continue care after 06/25/12 they have been in over 120 days  If so what would the need be for [facility], please let them know

## 2012-06-11 NOTE — Telephone Encounter (Signed)
[facility] [place] called and wants to know if they should continue care after 06/25/12 they have been in for over 120 days IF so what is the need and please let them know.

## 2012-06-13 ENCOUNTER — Telehealth: Payer: Self-pay | Admitting: *Deleted

## 2012-06-13 NOTE — Telephone Encounter (Signed)
[facility] reports vitals for today:  P-72 02 sat-98% BP 132/75  Nurse also states Pt twisted ankle and heard knee pop today. Ankle was bruised and swollen. Daughter aware. I advised OV if no better.

## 2012-06-14 ENCOUNTER — Emergency Department (INDEPENDENT_AMBULATORY_CARE_PROVIDER_SITE_OTHER)
Admission: EM | Admit: 2012-06-14 | Discharge: 2012-06-14 | Disposition: A | Discharge status: Home / Self Care | Payer: Medicare Other | Source: Home / Self Care

## 2012-06-14 ENCOUNTER — Emergency Department (INDEPENDENT_AMBULATORY_CARE_PROVIDER_SITE_OTHER): Payer: Medicare Other

## 2012-06-14 DIAGNOSIS — R609 Edema, unspecified: Secondary | ICD-10-CM

## 2012-06-14 DIAGNOSIS — M25579 Pain in unspecified ankle and joints of unspecified foot: Secondary | ICD-10-CM

## 2012-06-14 DIAGNOSIS — M2010 Hallux valgus (acquired), unspecified foot: Secondary | ICD-10-CM

## 2012-06-14 DIAGNOSIS — S93409A Sprain of unspecified ligament of unspecified ankle, initial encounter: Secondary | ICD-10-CM

## 2012-06-14 MED ORDER — HYDROCODONE-ACETAMINOPHEN 5-500 MG PO TABS: 1.0000 | ORAL_TABLET | Freq: Three times a day (TID) | ORAL | Status: DC | PRN

## 2012-06-14 NOTE — ED Provider Notes (Signed)
History     CSN: [id number]  Arrival date & time 06/14/12  1109   None     Chief Complaint  Patient presents with  . Ankle Injury    twisted left ankle    HPI Ankle pain x 2 days.  Pt accidentally rolled her ankle while at home yesterday.  No LOC.  No head trauma.  Usually ambulates intermittently with a walker.  Was using walker when he fell.  Ankle pain predominantly on lateral ankle.   Past Medical History  Diagnosis Date  . Hypertension   . Hyperlipidemia   . OSA (obstructive sleep apnea)     not on CPAP  . Bronchiectasis   . Aortic stenosis   . CAD (coronary artery disease)     Present PCI and MI  . GERD (gastroesophageal reflux disease)   . PUD (peptic ulcer disease)   . Legally blind   . Scleroderma     Past Surgical History  Procedure Date  . Cardiac valve replacement 2011  . Coronary artery bypass graft 2011  . Left shoulder surgery   . Corneal transplant   . Cholecystectomy   . Appendectomy   . Abdominal hysterectomy   . Total knee arthroplasty     Family History  Problem Relation Age of Onset  . Heart disease Sister   . Heart disease Brother     MI x 2  . Lung disease Brother     ? dz, was a smoker    History  Substance Use Topics  . Smoking status: Never Smoker   . Smokeless tobacco: Never Used  . Alcohol Use: No    OB History    Grav Para Term Preterm Abortions TAB SAB Ect Mult Living                  Review of Systems  All other systems reviewed and are negative.    Allergies  Levofloxacin  Home Medications   Current Outpatient Rx  Name  Route  Sig  Dispense  Refill  . AMBULATORY NON FORMULARY MEDICATION      Nebulizer machine. Dx: COPD, SOB, cough   1 each   0   . AMITRIPTYLINE HCL 50 MG PO TABS   Oral   Take 1 tablet (50 mg total) by mouth at bedtime.   90 tablet   0   . AMLODIPINE BESYLATE 5 MG PO TABS   Oral   Take 1 tablet (5 mg total) by mouth daily.   90 tablet   0   .  ASPIRIN-ACETAMINOPHEN-CAFFEINE 250-250-65 MG PO TABS   Oral   Take 1 tablet by mouth every 6 (six) hours as needed.         Marland Kitchen CLONIDINE HCL 0.1 MG/24HR TD PTWK   Transdermal   Place 1 patch (0.1 mg total) onto the skin once a week.   12 patch   1   . FUROSEMIDE 40 MG PO TABS   Oral   Take 1 tablet (40 mg total) by mouth daily.   30 tablet   0   . IPRATROPIUM-ALBUTEROL 0.5-2.5 (3) MG/3ML IN SOLN   Nebulization   Take 3 mLs by nebulization every 4 (four) hours as needed.   360 mL   11   . KETOCONAZOLE 2 % EX CREA   Topical   Apply topically 2 (two) times daily. For 2-4 weeks to affected areas.   15 g   0   . MELATONIN 10 MG PO  TABS   Oral   Take 1 tablet by mouth at bedtime.         Marland Kitchen PANTOPRAZOLE SODIUM 40 MG PO TBEC   Oral   Take 1 tablet (40 mg total) by mouth daily.   90 tablet   1   . PREDNISOLONE ACETATE 1 % OP SUSP   Both Eyes   Place 1 drop into both eyes daily.   5 mL   4   . TOPIRAMATE 25 MG PO TABS   Oral   Take 1 tablet (25 mg total) by mouth at bedtime.   90 tablet   0   . TRAMADOL HCL 50 MG PO TABS   Oral   Take 1 tablet (50 mg total) by mouth every 8 (eight) hours as needed for pain.   30 tablet   1   . VALSARTAN 320 MG PO TABS   Oral   Take 1 tablet (320 mg total) by mouth daily.   90 tablet   4   . AMOXICILL-CLARITHRO-LANSOPRAZ PO MISC   Oral   Take by mouth 2 (two) times daily. Follow package directions.   1 kit   0   . CLONIDINE HCL 0.2 MG/24HR TD PTWK   Transdermal   Place 1 patch (0.2 mg total) onto the skin once a week.   12 patch   2   . DOXYCYCLINE HYCLATE 100 MG PO CAPS   Oral   Take 1 capsule (100 mg total) by mouth 2 (two) times daily. For 10 days.   20 capsule   0   . HYDROCODONE-ACETAMINOPHEN 5-500 MG PO TABS   Oral   Take 1 tablet by mouth every 8 (eight) hours as needed for pain.   15 tablet   0   . ADVIL PM PO   Oral   Take 1 tablet by mouth at bedtime as needed.         . MELOXICAM 15 MG PO  TABS   Oral   Take 1 tablet (15 mg total) by mouth daily as needed for pain.   30 tablet   3   . METOPROLOL SUCCINATE ER 100 MG PO TB24   Oral   Take 1 tablet (100 mg total) by mouth daily. Take with or immediately following a meal.   90 tablet   0     BP 110/57  Pulse 64  Temp 98.1 F (36.7 C) (Oral)  Ht 5' (1.524 m)  Wt 141 lb (63.957 kg)  BMI 27.54 kg/m2  SpO2 94%  Physical Exam  Constitutional: She appears well-developed and well-nourished.       In wheelchair 2/2 ankle pain   HENT:  Head: Normocephalic and atraumatic.  Eyes: Conjunctivae normal are normal. Pupils are equal, round, and reactive to light.  Neck: Normal range of motion. Neck supple.  Cardiovascular: Normal rate and regular rhythm.   Pulmonary/Chest: Effort normal and breath sounds normal.  Abdominal: Soft.  Musculoskeletal:       + TTP over L lateral malleolus and L lateral foot.  Decreased ROM.  No numbness or distal paresthesias.    Neurological: She is alert.  Skin: Skin is warm.    ED Course  Procedures (including critical care time)  Labs Reviewed - No data to display Dg Ankle Complete Left  06/14/2012  *RADIOLOGY REPORT*  Clinical Data: Left ankle injury.  LEFT ANKLE COMPLETE - 3+ VIEW  Comparison: None.  Findings: No acute fracture or dislocation identified.  Mild degenerative  changes are seen at the level of the medial malleolus. There is suggestion of soft tissue swelling overlying the lateral malleolus.  IMPRESSION: Soft tissue swelling without visible acute fracture.   Original Report Authenticated By: Irish Lack, M.D.    Dg Foot Complete Left  06/14/2012  *RADIOLOGY REPORT*  Clinical Data: Injury with left foot and ankle pain.  LEFT FOOT - COMPLETE 3+ VIEW  Comparison: None.  Findings: No acute fractures are identified.  There is a hallux valgus deformity and suggestion of chronic subluxation at the first MTP joint.  Mild diffuse degenerative changes are seen throughout the foot.   Previously placed tacks are identified at the level of the cuboid.  No bony lesions or destruction.  Soft tissues are unremarkable.  IMPRESSION: No acute fracture.  Hallux valgus deformity and suggestion of chronic subluxation at the first MTP joint.   Original Report Authenticated By: Irish Lack, M.D.      1. Ankle sprain       MDM  Xrays negative for fracture.  Pt placed in CAM walker to help with overall stability and ambulation.  RICE and analgesics ( tylenol and NSAIDs). Plan for follow up with PCP in 3-5 days if sxs not improved.     The patient and/or caregiver has been counseled thoroughly with regard to treatment plan and/or medications prescribed including dosage, schedule, interactions, rationale for use, and possible side effects and they verbalize understanding. Diagnoses and expected course of recovery discussed and will return if not improved as expected or if the condition worsens. Patient and/or caregiver verbalized understanding.             Doree Albee, MD 06/24/12 [phone]

## 2012-06-14 NOTE — ED Notes (Signed)
[name] fell and twisted her left ankle yesterday at her home. She has pain and swelling.

## 2012-06-15 ENCOUNTER — Other Ambulatory Visit: Payer: Self-pay | Admitting: Physician Assistant

## 2012-06-16 NOTE — Telephone Encounter (Signed)
Yes. Contact them. She could be reevaluated for different care plan but I think she needs someone to come out and help her on a regular basis.

## 2012-06-16 NOTE — Telephone Encounter (Signed)
LMOM informing JoAnn w/ [facility].

## 2012-06-18 ENCOUNTER — Ambulatory Visit (INDEPENDENT_AMBULATORY_CARE_PROVIDER_SITE_OTHER): Payer: Medicare Other | Admitting: Physician Assistant

## 2012-06-18 ENCOUNTER — Encounter: Payer: Self-pay | Admitting: Physician Assistant

## 2012-06-18 ENCOUNTER — Telehealth: Payer: Self-pay | Admitting: *Deleted

## 2012-06-18 VITALS — BP 123/66 | HR 61

## 2012-06-18 DIAGNOSIS — S93409A Sprain of unspecified ligament of unspecified ankle, initial encounter: Secondary | ICD-10-CM

## 2012-06-18 DIAGNOSIS — S93402A Sprain of unspecified ligament of left ankle, initial encounter: Secondary | ICD-10-CM

## 2012-06-18 MED ORDER — HYDROCODONE-ACETAMINOPHEN 5-325 MG PO TABS: 1.0000 | ORAL_TABLET | Freq: Four times a day (QID) | ORAL | Status: DC | PRN

## 2012-06-18 NOTE — Progress Notes (Signed)
  Subjective:    Patient ID: [name], female    DOB: 09-18-1918, 78 y.o.   MRN: [id number]  HPI Patient is a 78 yo female who presents to the clinic to follow up on left ankle sprain. She was seen at [facility] and x-rays were done. No fracture was found. They gave her a boot and Norco. She wore the boot for a couple of days but she feels it is too heavy and caused more pain than help. She has been taking Norco and it does help control the pain. She denies being dizzy at time of fall. She just tripped [place] while opening her curtains. She has been elevating left leg but only applying heat because ice is too cold. She is already taking mobic daily. She is able to ambulate but there is still pain with pressure.    Review of Systems     Objective:   Physical Exam  Constitutional: She is oriented to person, place, and time. She appears well-developed and well-nourished.       IN a wheelchair.  Musculoskeletal:       ROM limited at left ankle due to swelling and pain. Tenderness over left lateral malleous and top of foot. Strength 5/5. Pulse 2+.   Neurological: She is alert and oriented to person, place, and time.  Skin:       Brusing with mild edema over lateral malleolus of left ankle.   Psychiatric: She has a normal mood and affect. Her behavior is normal.          Assessment & Plan:  Left ankle strain- Had nurse wrap left ankle with ace bandage. Encouraged patient to keep elevated and apply ice. Since she does not like ice did tell her heat may make swelling worse. Encouraged pt to start rehabilitation exercises writing the ABC's with her left foot. Keep wrapped with ACE bandage until feel muscle are stronger and swelling as gone down. Refilled Norco. Watch for dizziness. When start walking use walker and wear supportive shoe. Will continue to monitor BP and make sure not going too low. Follow up as needed.  Fall Risk high- pt is being seen regularly by Home Health. They will continue to monitor  and help prevent falling obstacles.

## 2012-06-18 NOTE — Telephone Encounter (Signed)
Nurse with [facility] calls with pt vital signs while in for visit today. BP 137/74, P= 64, R=18, O2 94%

## 2012-06-18 NOTE — Telephone Encounter (Signed)
Ok

## 2012-06-18 NOTE — Patient Instructions (Addendum)
Will refill Hydrocodone for pain.  ABC's with left foot.  Keep ankle elevated. Keep wrapped in Ace bandage for support.   Ankle Sprain An ankle sprain is an injury to the strong, fibrous tissues (ligaments) that hold the bones of your ankle joint together.  CAUSES An ankle sprain is usually caused by a fall or by twisting your ankle. Ankle sprains most commonly occur when you step on the outer edge of your foot, and your ankle turns inward. People who participate in sports are more prone to these types of injuries.  SYMPTOMS   Pain in your ankle. The pain may be present at rest or only when you are trying to stand or walk.  Swelling.  Bruising. Bruising may develop immediately or within 1 to 2 days after your injury.  Difficulty standing or walking, particularly when turning corners or changing directions. DIAGNOSIS  Your caregiver will ask you details about your injury and perform a physical exam of your ankle to determine if you have an ankle sprain. During the physical exam, your caregiver will press on and apply pressure to specific areas of your foot and ankle. Your caregiver will try to move your ankle in certain ways. An X-ray exam may be done to be sure a bone was not broken or a ligament did not separate from one of the bones in your ankle (avulsion fracture).  TREATMENT  Certain types of braces can help stabilize your ankle. Your caregiver can make a recommendation for this. Your caregiver may recommend the use of medicine for pain. If your sprain is severe, your caregiver may refer you to a surgeon who helps to restore function to parts of your skeletal system (orthopedist) or a physical therapist. HOME CARE INSTRUCTIONS   Apply ice to your injury for 1 to 2 days or as directed by your caregiver. Applying ice helps to reduce inflammation and pain.  Put ice in a plastic bag.  Place a towel between your skin and the bag.  Leave the ice on for 15 to 20 minutes at a time, every 2  hours while you are awake.  Only take over-the-counter or prescription medicines for pain, discomfort, or fever as directed by your caregiver.  Keep your injured leg elevated, when possible, to lessen swelling.  If your caregiver recommends crutches, use them as instructed. Gradually put weight on the affected ankle. Continue to use crutches or a cane until you can walk without feeling pain in your ankle.  If you have a plaster splint, wear the splint as directed by your caregiver. Do not rest it on anything harder than a pillow for the first 24 hours. Do not put weight on it. Do not get it wet. You may take it off to take a shower or bath.  You may have been given an elastic bandage to wear around your ankle to provide support. If the elastic bandage is too tight (you have numbness or tingling in your foot or your foot becomes cold and blue), adjust the bandage to make it comfortable.  If you have an air splint, you may blow more air into it or let air out to make it more comfortable. You may take your splint off at night and before taking a shower or bath.  Wiggle your toes in the splint several times per day to decrease swelling. SEEK MEDICAL CARE IF:   You have an increase in bruising, swelling, or pain.  Your toes feel extremely cold or you lose feeling  in your foot.  Your pain is not relieved with medicine. SEEK IMMEDIATE MEDICAL CARE IF:  Your toes are numb or blue.  You have severe pain. MAKE SURE YOU:   Understand these instructions.  Will watch your condition.  Will get help right away if you are not doing well or get worse. Document Released: 07/09/2005 Document Revised: 10/01/2011 Document Reviewed: 07/21/2011 [facility] Patient Information 2013 [place], [place].

## 2012-06-20 ENCOUNTER — Encounter (HOSPITAL_BASED_OUTPATIENT_CLINIC_OR_DEPARTMENT_OTHER): Payer: Self-pay | Admitting: *Deleted

## 2012-06-20 ENCOUNTER — Emergency Department (HOSPITAL_BASED_OUTPATIENT_CLINIC_OR_DEPARTMENT_OTHER)
Admission: EM | Admit: 2012-06-20 | Discharge: 2012-06-20 | Disposition: A | Discharge status: Home / Self Care | Payer: Medicare Other | Source: Home / Self Care | Attending: Emergency Medicine | Admitting: Emergency Medicine

## 2012-06-20 DIAGNOSIS — Z8711 Personal history of peptic ulcer disease: Secondary | ICD-10-CM | POA: Insufficient documentation

## 2012-06-20 DIAGNOSIS — Z951 Presence of aortocoronary bypass graft: Secondary | ICD-10-CM | POA: Insufficient documentation

## 2012-06-20 DIAGNOSIS — Z8719 Personal history of other diseases of the digestive system: Secondary | ICD-10-CM | POA: Insufficient documentation

## 2012-06-20 DIAGNOSIS — Z954 Presence of other heart-valve replacement: Secondary | ICD-10-CM | POA: Insufficient documentation

## 2012-06-20 DIAGNOSIS — I10 Essential (primary) hypertension: Secondary | ICD-10-CM | POA: Insufficient documentation

## 2012-06-20 DIAGNOSIS — E860 Dehydration: Secondary | ICD-10-CM

## 2012-06-20 DIAGNOSIS — Z8739 Personal history of other diseases of the musculoskeletal system and connective tissue: Secondary | ICD-10-CM | POA: Insufficient documentation

## 2012-06-20 DIAGNOSIS — Z8709 Personal history of other diseases of the respiratory system: Secondary | ICD-10-CM | POA: Insufficient documentation

## 2012-06-20 DIAGNOSIS — I2510 Atherosclerotic heart disease of native coronary artery without angina pectoris: Secondary | ICD-10-CM | POA: Insufficient documentation

## 2012-06-20 DIAGNOSIS — Z79899 Other long term (current) drug therapy: Secondary | ICD-10-CM | POA: Insufficient documentation

## 2012-06-20 DIAGNOSIS — R110 Nausea: Secondary | ICD-10-CM

## 2012-06-20 DIAGNOSIS — E785 Hyperlipidemia, unspecified: Secondary | ICD-10-CM | POA: Insufficient documentation

## 2012-06-20 DIAGNOSIS — Z8679 Personal history of other diseases of the circulatory system: Secondary | ICD-10-CM | POA: Insufficient documentation

## 2012-06-20 DIAGNOSIS — Z7982 Long term (current) use of aspirin: Secondary | ICD-10-CM | POA: Insufficient documentation

## 2012-06-20 DIAGNOSIS — R112 Nausea with vomiting, unspecified: Secondary | ICD-10-CM | POA: Insufficient documentation

## 2012-06-20 LAB — LIPASE, BLOOD: Lipase: 25 U/L (ref 11–59)

## 2012-06-20 LAB — COMPREHENSIVE METABOLIC PANEL
Albumin: 3 g/dL — ABNORMAL LOW (ref 3.5–5.2)
BUN: 28 mg/dL — ABNORMAL HIGH (ref 6–23)
Calcium: 9.2 mg/dL (ref 8.4–10.5)
Creatinine, Ser: 1.1 mg/dL (ref 0.50–1.10)
GFR calc Af Amer: 49 mL/min — ABNORMAL LOW (ref >90)
Glucose, Bld: 131 mg/dL — ABNORMAL HIGH (ref 70–99)
Total Protein: 7.3 g/dL (ref 6.0–8.3)

## 2012-06-20 LAB — URINALYSIS, ROUTINE W REFLEX MICROSCOPIC
Leukocytes, UA: NEGATIVE
Nitrite: NEGATIVE
Specific Gravity, Urine: 1.012 (ref 1.005–1.030)
Urobilinogen, UA: 0.2 mg/dL (ref 0.0–1.0)
pH: 7.5 (ref 5.0–8.0)

## 2012-06-20 LAB — CBC WITH DIFFERENTIAL/PLATELET
Basophils Relative: 0 % (ref 0–1)
Eosinophils Absolute: 0.1 10*3/uL (ref 0.0–0.7)
Eosinophils Relative: 0 % (ref 0–5)
HCT: 31.4 % — ABNORMAL LOW (ref 36.0–46.0)
Hemoglobin: 10.7 g/dL — ABNORMAL LOW (ref 12.0–15.0)
Lymphs Abs: 1.9 10*3/uL (ref 0.7–4.0)
MCH: 27.8 pg (ref 26.0–34.0)
MCHC: 34.1 g/dL (ref 30.0–36.0)
MCV: 81.6 fL (ref 78.0–100.0)
Monocytes Absolute: 0.8 10*3/uL (ref 0.1–1.0)
Monocytes Relative: 6 % (ref 3–12)
Neutrophils Relative %: 79 % — ABNORMAL HIGH (ref 43–77)

## 2012-06-20 MED ORDER — ONDANSETRON 4 MG PO TBDP: 4.0000 mg | ORAL_TABLET | Freq: Three times a day (TID) | ORAL | Status: DC | PRN

## 2012-06-20 MED ORDER — SODIUM CHLORIDE 0.9 % IV BOLUS (SEPSIS)
500.0000 mL | Freq: Once | INTRAVENOUS | Status: AC
  Administered 2012-06-20: 500 mL via INTRAVENOUS

## 2012-06-20 NOTE — ED Provider Notes (Signed)
Medical screening examination/treatment/procedure(s) were performed by non-physician practitioner and as supervising physician I was immediately available for consultation/collaboration.  Ethelda Chick, MD 06/20/12 1438

## 2012-06-20 NOTE — ED Provider Notes (Signed)
History     CSN: [id number]  Arrival date & time 06/20/12  1042   First MD Initiated Contact with Patient 06/20/12 1200      Chief Complaint  Patient presents with  . Emesis    (Consider location/radiation/quality/duration/timing/severity/associated sxs/prior treatment) HPI Comments: This is a 78 year old female, who presents to the emergency department with chief complaint of dehydration. Patient states that she has had diarrhea and vomiting since last night. Her symptoms continued until this morning. She states that now she feels nauseated, but has not vomited or had diarrhea since early this morning. Patient was seen 3 weeks ago for the same, and given IV fluid. Patient is accompanied by her daughter, who states that the fluids helped remarkably last time. The patient does not have any localized abdominal pain no McBurney point tenderness, right upper quadrant tenderness, or peritoneal signs. She denies fever, headache, chest pain, shortness of breath, constipation, peripheral edema, numbness or tingling of the extremities.  The history is provided by the patient. No language interpreter was used.    Past Medical History  Diagnosis Date  . Hypertension   . Hyperlipidemia   . OSA (obstructive sleep apnea)     not on CPAP  . Bronchiectasis   . Aortic stenosis   . CAD (coronary artery disease)     Present PCI and MI  . GERD (gastroesophageal reflux disease)   . PUD (peptic ulcer disease)   . Legally blind   . Scleroderma     Past Surgical History  Procedure Date  . Cardiac valve replacement 2011  . Coronary artery bypass graft 2011  . Left shoulder surgery   . Corneal transplant   . Cholecystectomy   . Appendectomy   . Abdominal hysterectomy   . Total knee arthroplasty     Family History  Problem Relation Age of Onset  . Heart disease Sister   . Heart disease Brother     MI x 2  . Lung disease Brother     ? dz, was a smoker    History  Substance Use Topics    . Smoking status: Never Smoker   . Smokeless tobacco: Never Used  . Alcohol Use: No    OB History    Grav Para Term Preterm Abortions TAB SAB Ect Mult Living                  Review of Systems  All other systems reviewed and are negative.    Allergies  Levofloxacin  Home Medications   Current Outpatient Rx  Name  Route  Sig  Dispense  Refill  . AMBULATORY NON FORMULARY MEDICATION      Nebulizer machine. Dx: COPD, SOB, cough   1 each   0   . AMITRIPTYLINE HCL 50 MG PO TABS      TAKE 1 TABLET AT BEDTIME   90 tablet   0   . AMLODIPINE BESYLATE 5 MG PO TABS   Oral   Take 1 tablet (5 mg total) by mouth daily.   90 tablet   0   . AMOXICILL-CLARITHRO-LANSOPRAZ PO MISC   Oral   Take by mouth 2 (two) times daily. Follow package directions.   1 kit   0   . ASPIRIN-ACETAMINOPHEN-CAFFEINE 250-250-65 MG PO TABS   Oral   Take 1 tablet by mouth every 6 (six) hours as needed.         Marland Kitchen CLONIDINE HCL 0.1 MG/24HR TD PTWK   Transdermal  Place 1 patch (0.1 mg total) onto the skin once a week.   12 patch   1   . CLONIDINE HCL 0.2 MG/24HR TD PTWK   Transdermal   Place 1 patch (0.2 mg total) onto the skin once a week.   12 patch   2   . DOXYCYCLINE HYCLATE 100 MG PO CAPS   Oral   Take 1 capsule (100 mg total) by mouth 2 (two) times daily. For 10 days.   20 capsule   0   . FUROSEMIDE 40 MG PO TABS   Oral   Take 1 tablet (40 mg total) by mouth daily.   30 tablet   0   . HYDROCODONE-ACETAMINOPHEN 5-325 MG PO TABS   Oral   Take 1 tablet by mouth every 6 (six) hours as needed for pain.   15 tablet   0   . HYDROCODONE-ACETAMINOPHEN 5-500 MG PO TABS   Oral   Take 1 tablet by mouth every 8 (eight) hours as needed for pain.   15 tablet   0   . ADVIL PM PO   Oral   Take 1 tablet by mouth at bedtime as needed.         . IPRATROPIUM-ALBUTEROL 0.5-2.5 (3) MG/3ML IN SOLN   Nebulization   Take 3 mLs by nebulization every 4 (four) hours as needed.   360  mL   11   . KETOCONAZOLE 2 % EX CREA   Topical   Apply topically 2 (two) times daily. For 2-4 weeks to affected areas.   15 g   0   . MELATONIN 10 MG PO TABS   Oral   Take 1 tablet by mouth at bedtime.         . MELOXICAM 15 MG PO TABS   Oral   Take 1 tablet (15 mg total) by mouth daily as needed for pain.   30 tablet   3   . METOPROLOL SUCCINATE ER 100 MG PO TB24   Oral   Take 1 tablet (100 mg total) by mouth daily. Take with or immediately following a meal.   90 tablet   0   . PANTOPRAZOLE SODIUM 40 MG PO TBEC   Oral   Take 1 tablet (40 mg total) by mouth daily.   90 tablet   1   . PREDNISOLONE ACETATE 1 % OP SUSP   Both Eyes   Place 1 drop into both eyes daily.   5 mL   4   . TOPIRAMATE 25 MG PO TABS   Oral   Take 1 tablet (25 mg total) by mouth at bedtime.   90 tablet   0   . TRAMADOL HCL 50 MG PO TABS   Oral   Take 1 tablet (50 mg total) by mouth every 8 (eight) hours as needed for pain.   30 tablet   1   . VALSARTAN 320 MG PO TABS   Oral   Take 1 tablet (320 mg total) by mouth daily.   90 tablet   4     BP 134/44  Pulse 68  Temp 97.8 F (36.6 C) (Oral)  Resp 18  SpO2 94%  Physical Exam  Nursing note and vitals reviewed. Constitutional: She is oriented to person, place, and time. She appears well-developed and well-nourished.  HENT:  Head: Normocephalic and atraumatic.       Patient appears dry  Eyes: Conjunctivae normal and EOM are normal. Pupils are equal, round, and  reactive to light.  Neck: Normal range of motion. Neck supple.  Cardiovascular: Normal rate and regular rhythm.  Exam reveals no gallop and no friction rub.   No murmur heard. Pulmonary/Chest: Effort normal and breath sounds normal. No respiratory distress. She has no wheezes. She has no rales. She exhibits no tenderness.  Abdominal: Soft. Bowel sounds are normal. She exhibits no distension and no mass. There is no tenderness. There is no rebound and no guarding.   Musculoskeletal: Normal range of motion. She exhibits no edema and no tenderness.  Neurological: She is alert and oriented to person, place, and time.  Skin: Skin is warm and dry.  Psychiatric: She has a normal mood and affect. Her behavior is normal. Judgment and thought content normal.    ED Course  Procedures (including critical care time)  Labs Reviewed  CBC WITH DIFFERENTIAL - Abnormal; Notable for the following:    WBC 13.7 (*)     RBC 3.85 (*)     Hemoglobin 10.7 (*)     HCT 31.4 (*)     Platelets 440 (*)     Neutrophils Relative 79 (*)     Neutro Abs 10.9 (*)     All other components within normal limits  COMPREHENSIVE METABOLIC PANEL - Abnormal; Notable for the following:    Glucose, Bld 131 (*)     BUN 28 (*)     Albumin 3.0 (*)     Total Bilirubin 0.2 (*)     GFR calc non Af Amer 43 (*)     GFR calc Af Amer 49 (*)     All other components within normal limits  LIPASE, BLOOD  URINALYSIS, ROUTINE W REFLEX MICROSCOPIC   Results for orders placed during the hospital encounter of 06/20/12  CBC WITH DIFFERENTIAL      Component Value Range   WBC 13.7 (*) 4.0 - 10.5 K/uL   RBC 3.85 (*) 3.87 - 5.11 MIL/uL   Hemoglobin 10.7 (*) 12.0 - 15.0 g/dL   HCT 78.2 (*) 95.6 - 21.3 %   MCV 81.6  78.0 - 100.0 fL   MCH 27.8  26.0 - 34.0 pg   MCHC 34.1  30.0 - 36.0 g/dL   RDW 08.6  57.8 - 46.9 %   Platelets 440 (*) 150 - 400 K/uL   Neutrophils Relative 79 (*) 43 - 77 %   Neutro Abs 10.9 (*) 1.7 - 7.7 K/uL   Lymphocytes Relative 14  12 - 46 %   Lymphs Abs 1.9  0.7 - 4.0 K/uL   Monocytes Relative 6  3 - 12 %   Monocytes Absolute 0.8  0.1 - 1.0 K/uL   Eosinophils Relative 0  0 - 5 %   Eosinophils Absolute 0.1  0.0 - 0.7 K/uL   Basophils Relative 0  0 - 1 %   Basophils Absolute 0.1  0.0 - 0.1 K/uL  COMPREHENSIVE METABOLIC PANEL      Component Value Range   Sodium 136  135 - 145 mEq/L   Potassium 3.7  3.5 - 5.1 mEq/L   Chloride 97  96 - 112 mEq/L   CO2 26  19 - 32 mEq/L    Glucose, Bld 131 (*) 70 - 99 mg/dL   BUN 28 (*) 6 - 23 mg/dL   Creatinine, Ser 6.29  0.50 - 1.10 mg/dL   Calcium 9.2  8.4 - 52.8 mg/dL   Total Protein 7.3  6.0 - 8.3 g/dL   Albumin 3.0 (*)  3.5 - 5.2 g/dL   AST 22  0 - 37 U/L   ALT 13  0 - 35 U/L   Alkaline Phosphatase 102  39 - 117 U/L   Total Bilirubin 0.2 (*) 0.3 - 1.2 mg/dL   GFR calc non Af Amer 43 (*) >90 mL/min   GFR calc Af Amer 49 (*) >90 mL/min  LIPASE, BLOOD      Component Value Range   Lipase 25  11 - 59 U/L  URINALYSIS, ROUTINE W REFLEX MICROSCOPIC      Component Value Range   Color, Urine YELLOW  YELLOW   APPearance CLEAR  CLEAR   Specific Gravity, Urine 1.012  1.005 - 1.030   pH 7.5  5.0 - 8.0   Glucose, UA NEGATIVE  NEGATIVE mg/dL   Hgb urine dipstick NEGATIVE  NEGATIVE   Bilirubin Urine NEGATIVE  NEGATIVE   Ketones, ur NEGATIVE  NEGATIVE mg/dL   Protein, ur NEGATIVE  NEGATIVE mg/dL   Urobilinogen, UA 0.2  0.0 - 1.0 mg/dL   Nitrite NEGATIVE  NEGATIVE   Leukocytes, UA NEGATIVE  NEGATIVE       1. Dehydration   2. Nausea       MDM  This is a 78 year old female with nausea and dehydration. Patient has been seen here in the past for the same. I discussed this patient with Dr. Karma Ganja. I'm going to discharge the patient to home with primary care followup. Patient reports that she is feeling much better, after receiving fluids. The daughter was concerned that she had become dehydrated after having an episode of nausea and vomiting last night. I will also send the patient home with Zofran. The daughter and patient understand and agree with the plan. The patient is stable and ready for discharge.        Roxy Horseman, PA-C 06/20/12 1419

## 2012-06-20 NOTE — ED Notes (Signed)
Pt started having diarrhea yesterday at 5pm.  Vomited last night until 1am.

## 2012-06-21 ENCOUNTER — Inpatient Hospital Stay (HOSPITAL_BASED_OUTPATIENT_CLINIC_OR_DEPARTMENT_OTHER)
Admission: EM | Admit: 2012-06-21 | Discharge: 2012-06-25 | DRG: 377 | Disposition: A | Discharge status: Skilled Nursing Facility | Payer: Medicare Other | Attending: Internal Medicine | Admitting: Internal Medicine

## 2012-06-21 ENCOUNTER — Emergency Department (HOSPITAL_BASED_OUTPATIENT_CLINIC_OR_DEPARTMENT_OTHER): Payer: Medicare Other

## 2012-06-21 ENCOUNTER — Encounter (HOSPITAL_BASED_OUTPATIENT_CLINIC_OR_DEPARTMENT_OTHER): Payer: Self-pay | Admitting: *Deleted

## 2012-06-21 DIAGNOSIS — E785 Hyperlipidemia, unspecified: Secondary | ICD-10-CM | POA: Diagnosis present

## 2012-06-21 DIAGNOSIS — D62 Acute posthemorrhagic anemia: Secondary | ICD-10-CM | POA: Diagnosis present

## 2012-06-21 DIAGNOSIS — J9620 Acute and chronic respiratory failure, unspecified whether with hypoxia or hypercapnia: Secondary | ICD-10-CM | POA: Diagnosis present

## 2012-06-21 DIAGNOSIS — R112 Nausea with vomiting, unspecified: Secondary | ICD-10-CM

## 2012-06-21 DIAGNOSIS — Z953 Presence of xenogenic heart valve: Secondary | ICD-10-CM

## 2012-06-21 DIAGNOSIS — G4733 Obstructive sleep apnea (adult) (pediatric): Secondary | ICD-10-CM | POA: Diagnosis present

## 2012-06-21 DIAGNOSIS — K209 Esophagitis, unspecified without bleeding: Secondary | ICD-10-CM | POA: Diagnosis present

## 2012-06-21 DIAGNOSIS — N179 Acute kidney failure, unspecified: Secondary | ICD-10-CM | POA: Diagnosis present

## 2012-06-21 DIAGNOSIS — M7070 Other bursitis of hip, unspecified hip: Secondary | ICD-10-CM

## 2012-06-21 DIAGNOSIS — D649 Anemia, unspecified: Secondary | ICD-10-CM

## 2012-06-21 DIAGNOSIS — R0609 Other forms of dyspnea: Secondary | ICD-10-CM

## 2012-06-21 DIAGNOSIS — J8410 Pulmonary fibrosis, unspecified: Secondary | ICD-10-CM | POA: Diagnosis present

## 2012-06-21 DIAGNOSIS — Z951 Presence of aortocoronary bypass graft: Secondary | ICD-10-CM

## 2012-06-21 DIAGNOSIS — K449 Diaphragmatic hernia without obstruction or gangrene: Secondary | ICD-10-CM | POA: Diagnosis present

## 2012-06-21 DIAGNOSIS — J310 Chronic rhinitis: Secondary | ICD-10-CM

## 2012-06-21 DIAGNOSIS — I2510 Atherosclerotic heart disease of native coronary artery without angina pectoris: Secondary | ICD-10-CM

## 2012-06-21 DIAGNOSIS — E860 Dehydration: Secondary | ICD-10-CM | POA: Diagnosis present

## 2012-06-21 DIAGNOSIS — J9610 Chronic respiratory failure, unspecified whether with hypoxia or hypercapnia: Secondary | ICD-10-CM

## 2012-06-21 DIAGNOSIS — I720 Aneurysm of carotid artery: Secondary | ICD-10-CM

## 2012-06-21 DIAGNOSIS — K219 Gastro-esophageal reflux disease without esophagitis: Secondary | ICD-10-CM | POA: Diagnosis present

## 2012-06-21 DIAGNOSIS — R197 Diarrhea, unspecified: Secondary | ICD-10-CM | POA: Diagnosis present

## 2012-06-21 DIAGNOSIS — R0600 Dyspnea, unspecified: Secondary | ICD-10-CM

## 2012-06-21 DIAGNOSIS — J479 Bronchiectasis, uncomplicated: Secondary | ICD-10-CM

## 2012-06-21 DIAGNOSIS — Z952 Presence of prosthetic heart valve: Secondary | ICD-10-CM

## 2012-06-21 DIAGNOSIS — Z66 Do not resuscitate: Secondary | ICD-10-CM | POA: Diagnosis present

## 2012-06-21 DIAGNOSIS — Z79899 Other long term (current) drug therapy: Secondary | ICD-10-CM

## 2012-06-21 DIAGNOSIS — K922 Gastrointestinal hemorrhage, unspecified: Principal | ICD-10-CM

## 2012-06-21 DIAGNOSIS — R0789 Other chest pain: Secondary | ICD-10-CM

## 2012-06-21 DIAGNOSIS — E876 Hypokalemia: Secondary | ICD-10-CM | POA: Diagnosis present

## 2012-06-21 DIAGNOSIS — H548 Legal blindness, as defined in USA: Secondary | ICD-10-CM | POA: Diagnosis present

## 2012-06-21 DIAGNOSIS — I10 Essential (primary) hypertension: Secondary | ICD-10-CM | POA: Diagnosis present

## 2012-06-21 LAB — BASIC METABOLIC PANEL WITH GFR
CO2: 22 meq/L (ref 19–32)
Calcium: 8.6 mg/dL (ref 8.4–10.5)
Creatinine, Ser: 1 mg/dL (ref 0.50–1.10)
Glucose, Bld: 110 mg/dL — ABNORMAL HIGH (ref 70–99)

## 2012-06-21 LAB — URINALYSIS, ROUTINE W REFLEX MICROSCOPIC
Bilirubin Urine: NEGATIVE
Glucose, UA: NEGATIVE mg/dL
Hgb urine dipstick: NEGATIVE
Ketones, ur: NEGATIVE mg/dL
Leukocytes, UA: NEGATIVE
Nitrite: NEGATIVE
Protein, ur: NEGATIVE mg/dL
Specific Gravity, Urine: 1.009 (ref 1.005–1.030)
Urobilinogen, UA: 0.2 mg/dL (ref 0.0–1.0)
pH: 6 (ref 5.0–8.0)

## 2012-06-21 LAB — CBC WITH DIFFERENTIAL/PLATELET
Basophils Absolute: 0 K/uL (ref 0.0–0.1)
Basophils Relative: 0 % (ref 0–1)
Eosinophils Absolute: 0.1 10*3/uL (ref 0.0–0.7)
Eosinophils Relative: 1 % (ref 0–5)
HCT: 26.2 % — ABNORMAL LOW (ref 36.0–46.0)
Hemoglobin: 8.8 g/dL — ABNORMAL LOW (ref 12.0–15.0)
Lymphocytes Relative: 15 % (ref 12–46)
Lymphs Abs: 2.2 K/uL (ref 0.7–4.0)
MCH: 27.8 pg (ref 26.0–34.0)
MCHC: 33.6 g/dL (ref 30.0–36.0)
MCV: 82.9 fL (ref 78.0–100.0)
Monocytes Absolute: 1 K/uL (ref 0.1–1.0)
Monocytes Relative: 7 % (ref 3–12)
Neutro Abs: 11.2 10*3/uL — ABNORMAL HIGH (ref 1.7–7.7)
Neutrophils Relative %: 77 % (ref 43–77)
Platelets: 390 10*3/uL (ref 150–400)
RBC: 3.16 MIL/uL — ABNORMAL LOW (ref 3.87–5.11)
RDW: 14.8 % (ref 11.5–15.5)
WBC: 14.5 K/uL — ABNORMAL HIGH (ref 4.0–10.5)

## 2012-06-21 LAB — BASIC METABOLIC PANEL
BUN: 18 mg/dL (ref 6–23)
Chloride: 99 mEq/L (ref 96–112)
GFR calc Af Amer: 55 mL/min — ABNORMAL LOW (ref >90)
GFR calc non Af Amer: 48 mL/min — ABNORMAL LOW (ref >90)
Potassium: 3.5 mEq/L (ref 3.5–5.1)
Sodium: 132 mEq/L — ABNORMAL LOW (ref 135–145)

## 2012-06-21 LAB — OCCULT BLOOD X 1 CARD TO LAB, STOOL: Fecal Occult Bld: POSITIVE

## 2012-06-21 LAB — TROPONIN I: Troponin I: <0.3 ng/mL (ref <0.30)

## 2012-06-21 LAB — LACTIC ACID, PLASMA: Lactic Acid, Venous: 1.4 mmol/L (ref 0.5–2.2)

## 2012-06-21 MED ORDER — SODIUM CHLORIDE 0.9 % IV BOLUS (SEPSIS)
1000.0000 mL | Freq: Once | INTRAVENOUS | Status: AC
  Administered 2012-06-21: 1000 mL via INTRAVENOUS

## 2012-06-21 MED ORDER — IOHEXOL 300 MG/ML  SOLN
100.0000 mL | Freq: Once | INTRAMUSCULAR | Status: AC | PRN
  Administered 2012-06-21: 100 mL via INTRAVENOUS

## 2012-06-21 MED ORDER — IOHEXOL 300 MG/ML  SOLN: 50.0000 mL | Freq: Once | INTRAMUSCULAR | Status: AC | PRN

## 2012-06-21 MED ORDER — FENTANYL CITRATE 0.05 MG/ML IJ SOLN
50.0000 ug | Freq: Once | INTRAMUSCULAR | Status: AC
  Administered 2012-06-21: 50 ug via INTRAVENOUS
  Filled 2012-06-21: qty 2

## 2012-06-21 NOTE — ED Provider Notes (Addendum)
History  This chart was scribed for [name]. Oletta Lamas, MD by Ardeen Jourdain, ED Scribe. This patient was seen in room MH08/MH08 and the patient's care was started at 1601.  Level 5 Caveat: Dementia  CSN: [id number]  Arrival date & time 06/21/12  1550   First MD Initiated Contact with Patient 06/21/12 1601      Chief Complaint  Patient presents with  . Weakness     The history is provided by the patient and a relative. The history is limited by the condition of the patient. No language interpreter was used.    [name] is a 78 y.o. female who presents to the Emergency Department complaining of gradually worsening generalized weakness with associated cough, rhinorrhea, nausea, emesis and diarrhea. She also has associated SOB and CP which is aggravated by walking and movement. The pt's daughter reports the pt had a low blood pressure reading at home before they brought her to the ED yesterday. She presented to the ED 1 day ago for the same symptoms and the symptoms have not improved. She reports having at least 10 occurences of diarrhea before visiting the ED yesterday. She states 3 days ago she felt normal. She denies sick contact. She has a h/o HTN, OSA, CAD, and GERD. She denies smoking and alcohol use.    Past Medical History  Diagnosis Date  . Hypertension   . Hyperlipidemia   . OSA (obstructive sleep apnea)     not on CPAP  . Bronchiectasis   . Aortic stenosis   . CAD (coronary artery disease)     Present PCI and MI  . GERD (gastroesophageal reflux disease)   . PUD (peptic ulcer disease)   . Legally blind   . Scleroderma     Past Surgical History  Procedure Date  . Cardiac valve replacement 2011  . Coronary artery bypass graft 2011  . Left shoulder surgery   . Corneal transplant   . Cholecystectomy   . Appendectomy   . Abdominal hysterectomy   . Total knee arthroplasty     Family History  Problem Relation Age of Onset  . Heart disease Sister   . Heart  disease Brother     MI x 2  . Lung disease Brother     ? dz, was a smoker    History  Substance Use Topics  . Smoking status: Never Smoker   . Smokeless tobacco: Never Used  . Alcohol Use: No   No OB history available.   Review of Systems  Unable to perform ROS: Dementia  Constitutional: Positive for fatigue.  Respiratory: Positive for shortness of breath.   Cardiovascular: Positive for chest pain.  Gastrointestinal: Positive for nausea, vomiting and diarrhea.  Neurological: Positive for weakness.    Allergies  Levofloxacin  Home Medications   Current Outpatient Rx  Name  Route  Sig  Dispense  Refill  . AMBULATORY NON FORMULARY MEDICATION      Nebulizer machine. Dx: COPD, SOB, cough   1 each   0   . AMITRIPTYLINE HCL 50 MG PO TABS      TAKE 1 TABLET AT BEDTIME   90 tablet   0   . AMLODIPINE BESYLATE 5 MG PO TABS   Oral   Take 1 tablet (5 mg total) by mouth daily.   90 tablet   0   . AMOXICILL-CLARITHRO-LANSOPRAZ PO MISC   Oral   Take by mouth 2 (two) times daily. Follow package directions.   1  kit   0   . ASPIRIN-ACETAMINOPHEN-CAFFEINE 250-250-65 MG PO TABS   Oral   Take 1 tablet by mouth every 6 (six) hours as needed.         Marland Kitchen CLONIDINE HCL 0.1 MG/24HR TD PTWK   Transdermal   Place 1 patch (0.1 mg total) onto the skin once a week.   12 patch   1   . CLONIDINE HCL 0.2 MG/24HR TD PTWK   Transdermal   Place 1 patch (0.2 mg total) onto the skin once a week.   12 patch   2   . DOXYCYCLINE HYCLATE 100 MG PO CAPS   Oral   Take 1 capsule (100 mg total) by mouth 2 (two) times daily. For 10 days.   20 capsule   0   . FUROSEMIDE 40 MG PO TABS   Oral   Take 1 tablet (40 mg total) by mouth daily.   30 tablet   0   . HYDROCODONE-ACETAMINOPHEN 5-325 MG PO TABS   Oral   Take 1 tablet by mouth every 6 (six) hours as needed for pain.   15 tablet   0   . HYDROCODONE-ACETAMINOPHEN 5-500 MG PO TABS   Oral   Take 1 tablet by mouth every 8  (eight) hours as needed for pain.   15 tablet   0   . ADVIL PM PO   Oral   Take 1 tablet by mouth at bedtime as needed.         . IPRATROPIUM-ALBUTEROL 0.5-2.5 (3) MG/3ML IN SOLN   Nebulization   Take 3 mLs by nebulization every 4 (four) hours as needed.   360 mL   11   . KETOCONAZOLE 2 % EX CREA   Topical   Apply topically 2 (two) times daily. For 2-4 weeks to affected areas.   15 g   0   . MELATONIN 10 MG PO TABS   Oral   Take 1 tablet by mouth at bedtime.         . MELOXICAM 15 MG PO TABS   Oral   Take 1 tablet (15 mg total) by mouth daily as needed for pain.   30 tablet   3   . METOPROLOL SUCCINATE ER 100 MG PO TB24   Oral   Take 1 tablet (100 mg total) by mouth daily. Take with or immediately following a meal.   90 tablet   0   . ONDANSETRON 4 MG PO TBDP   Oral   Take 1 tablet (4 mg total) by mouth every 8 (eight) hours as needed for nausea.   10 tablet   0   . PANTOPRAZOLE SODIUM 40 MG PO TBEC   Oral   Take 1 tablet (40 mg total) by mouth daily.   90 tablet   1   . PREDNISOLONE ACETATE 1 % OP SUSP   Both Eyes   Place 1 drop into both eyes daily.   5 mL   4   . TOPIRAMATE 25 MG PO TABS   Oral   Take 1 tablet (25 mg total) by mouth at bedtime.   90 tablet   0   . TRAMADOL HCL 50 MG PO TABS   Oral   Take 1 tablet (50 mg total) by mouth every 8 (eight) hours as needed for pain.   30 tablet   1   . VALSARTAN 320 MG PO TABS   Oral   Take 1 tablet (320 mg total)  by mouth daily.   90 tablet   4     Triage Vitals: BP 88/55  Pulse 76  Temp 98.3 F (36.8 C) (Oral)  Resp 24  Ht 5' (1.524 m)  Wt 141 lb (63.957 kg)  BMI 27.54 kg/m2  SpO2 90%  Physical Exam  Nursing note and vitals reviewed. Constitutional: She is oriented to person, place, and time. She appears well-developed and well-nourished. No distress.  HENT:  Head: Normocephalic and atraumatic.  Eyes: EOM are normal. Pupils are equal, round, and reactive to light.  Neck:  Normal range of motion. Neck supple. No tracheal deviation present.  Cardiovascular: Normal rate, regular rhythm and normal heart sounds.   No murmur heard. Pulmonary/Chest: Effort normal and breath sounds normal. No respiratory distress. She has no wheezes.       Tachypneic, using accessory muscles, 93% O2 at exam, mild parachiasmal cough, no crepitance, mild tenderness to right sided chest, worse with ROM of arm, strong pulses, good capillary refill   Abdominal: Soft. She exhibits no distension.  Musculoskeletal: Normal range of motion. She exhibits no edema.  Neurological: She is alert and oriented to person, place, and time.  Skin: Skin is warm and dry.  Psychiatric: She has a normal mood and affect. Her behavior is normal.    ED Course  Procedures (including critical care time)   CRITICAL CARE Performed by: Lear Ng.   Total critical care time: 30 min  Critical care time was exclusive of separately billable procedures and treating other patients.  Critical care was necessary to treat or prevent imminent or life-threatening deterioration.  Critical care was time spent personally by me on the following activities: development of treatment plan with patient and/or surrogate as well as nursing, discussions with consultants, evaluation of patient's response to treatment, examination of patient, obtaining history from patient or surrogate, ordering and performing treatments and interventions, ordering and review of laboratory studies, ordering and review of radiographic studies, pulse oximetry and re-evaluation of patient's condition.   DIAGNOSTIC STUDIES: Oxygen Saturation is 93% on room air, borderline low and abn by my interpretation.  COORDINATION OF CARE:  4:17 PM: Discussed treatment plan which includes blood work, an EKG, and  with pt at bedside and pt agreed to plan.    Labs Reviewed  CBC WITH DIFFERENTIAL - Abnormal; Notable for the following:    WBC 14.5 (*)       RBC 3.16 (*)     Hemoglobin 8.8 (*)     HCT 26.2 (*)     Neutro Abs 11.2 (*)     All other components within normal limits  BASIC METABOLIC PANEL - Abnormal; Notable for the following:    Sodium 132 (*)     Glucose, Bld 110 (*)     GFR calc non Af Amer 48 (*)     GFR calc Af Amer 55 (*)     All other components within normal limits  TROPONIN I  URINALYSIS, ROUTINE W REFLEX MICROSCOPIC  LACTIC ACID, PLASMA  OCCULT BLOOD X 1 CARD TO LAB, STOOL   ECG at time 16:09 shows SR with first degree AV block, prolonged PR, seen previously.  LAD, nonspecific T wave abn's.  No sig change from prior ECG.    Dg Ribs Unilateral W/chest Right  06/21/2012  *RADIOLOGY REPORT*  Clinical Data: Right-sided chest pain.  Shortness of breath. Generalized weakness.  RIGHT RIBS AND CHEST - 3+ VIEW  Comparison: Two-view chest x-ray 05/14/2012, 11/21/2011.  No prior  rib imaging.  Findings: No fractures identified involving the right ribs. Osteopenia.  Costal cartilage calcification.  Cardiac silhouette upper normal in size to slightly enlarged but stable.  Thoracic aorta atherosclerotic, unchanged.  Hilar and mediastinal contours otherwise unremarkable.  Changes of pulmonary fibrosis in both lungs, right greater than left, with pleuroparenchymal scarring at both lung bases and parenchymal scarring in the right upper lobe, unchanged.  No new pulmonary parenchymal abnormalities.  IMPRESSION:  1.  No right rib fractures identified. 2.  No acute cardiopulmonary disease.  Stable changes of pulmonary fibrosis, pleuroparenchymal scarring at the lung bases, and parenchymal scarring in the right upper lobe.   Original Report Authenticated By: Hulan Saas, M.D.    Ct Abdomen Pelvis W Contrast  06/21/2012  *RADIOLOGY REPORT*  Clinical Data: .  Abdominal pain and diarrhea.  CT ABDOMEN AND PELVIS WITH CONTRAST  Technique:  Multidetector CT imaging of the abdomen and pelvis was performed following the standard protocol during  bolus administration of intravenous contrast.  Contrast: OMNIPAQUE IOHEXOL 300 MG/ML  SOLN  Comparison: None.  Findings: Pulmonary scarring in both lung bases.  Small right pleural effusion.  Liver and bile ducts are normal.  Gallbladder has been removed. The pancreas is atrophic.  The spleen is normal.  Kidneys show no obstruction or mass.  2.5 cm right renal cyst.  No renal calculi. There is liquid stool in the colon.  Small bowel is not dilated. No free fluid in the abdomen.  No mass or adenopathy.  No acute bony abnormality.  IMPRESSION: Bibasilar lung scarring.  Small right pleural effusion.  Liquid stool in the colon may represent gastroenteritis.  No bowel obstruction.  Sigmoid diverticulosis without diverticulitis.   Original Report Authenticated By: Janeece Riggers, M.D.      1. GI bleed   2. Anemia   3. Nausea vomiting and diarrhea   4. Musculoskeletal chest pain     6:21 PM I reviewed rib film and CXR myself.  WBC is more elevated at 14 versus 13 yesterday.  No infiltrate on CXR.  Troponin is neg.  Will get abd/pelvis CT scan.  AFter 1 L IVF bolus, BP is improved to 134/52.  9:53 PM I reviewed CT results myself.  I discussed with family.  Gastroenteritis.  BP has improved, however pt continues to feel fatigued, SOB.  With ambulation just about 20 feet with assistance, pt was very SOB, became tachypneic, sats down to 88%.  Pt uses home O2 on occasion at home.  Givnen initial hypotension, inability to perform ADLS, unable to ambualte well even with help of family, N/V/D, will admit to hospital.  I think pt's CP was only musculoskeletal and improved now after fentanyl.    MDM  I personally performed the services described in this documentation, which was scribed in my presence. The recorded information has been reviewed and is accurate.   Below, I reviewed prior labs from 30 hours ago. Reviewed ED visit notes.   Results for orders placed during the hospital encounter of 06/21/12 (from  the past 48 hour(s))  CBC WITH DIFFERENTIAL     Status: Abnormal   Collection Time   06/21/12  4:15 PM      Component Value Range Comment   WBC 14.5 (*) 4.0 - 10.5 K/uL    RBC 3.16 (*) 3.87 - 5.11 MIL/uL    Hemoglobin 8.8 (*) 12.0 - 15.0 g/dL    HCT 40.9 (*) 81.1 - 46.0 %    MCV 82.9  78.0 - 100.0 fL    MCH 27.8  26.0 - 34.0 pg    MCHC 33.6  30.0 - 36.0 g/dL    RDW 13.0  86.5 - 78.4 %    Platelets 390  150 - 400 K/uL    Neutrophils Relative 77  43 - 77 %    Neutro Abs 11.2 (*) 1.7 - 7.7 K/uL    Lymphocytes Relative 15  12 - 46 %    Lymphs Abs 2.2  0.7 - 4.0 K/uL    Monocytes Relative 7  3 - 12 %    Monocytes Absolute 1.0  0.1 - 1.0 K/uL    Eosinophils Relative 1  0 - 5 %    Eosinophils Absolute 0.1  0.0 - 0.7 K/uL    Basophils Relative 0  0 - 1 %    Basophils Absolute 0.0  0.0 - 0.1 K/uL   BASIC METABOLIC PANEL     Status: Abnormal   Collection Time   06/21/12  4:15 PM      Component Value Range Comment   Sodium 132 (*) 135 - 145 mEq/L    Potassium 3.5  3.5 - 5.1 mEq/L    Chloride 99  96 - 112 mEq/L    CO2 22  19 - 32 mEq/L    Glucose, Bld 110 (*) 70 - 99 mg/dL    BUN 18  6 - 23 mg/dL    Creatinine, Ser 6.96  0.50 - 1.10 mg/dL    Calcium 8.6  8.4 - 29.5 mg/dL    GFR calc non Af Amer 48 (*) >90 mL/min    GFR calc Af Amer 55 (*) >90 mL/min   TROPONIN I     Status: Normal   Collection Time   06/21/12  4:15 PM      Component Value Range Comment   Troponin I <0.30  <0.30 ng/mL   LACTIC ACID, PLASMA     Status: Normal   Collection Time   06/21/12  5:25 PM      Component Value Range Comment   Lactic Acid, Venous 1.4  0.5 - 2.2 mmol/L   URINALYSIS, ROUTINE W REFLEX MICROSCOPIC     Status: Normal   Collection Time   06/21/12  6:18 PM      Component Value Range Comment   Color, Urine YELLOW  YELLOW    APPearance CLEAR  CLEAR    Specific Gravity, Urine 1.009  1.005 - 1.030    pH 6.0  5.0 - 8.0    Glucose, UA NEGATIVE  NEGATIVE mg/dL    Hgb urine dipstick NEGATIVE   NEGATIVE    Bilirubin Urine NEGATIVE  NEGATIVE    Ketones, ur NEGATIVE  NEGATIVE mg/dL    Protein, ur NEGATIVE  NEGATIVE mg/dL    Urobilinogen, UA 0.2  0.0 - 1.0 mg/dL    Nitrite NEGATIVE  NEGATIVE    Leukocytes, UA NEGATIVE  NEGATIVE MICROSCOPIC NOT DONE ON URINES WITH NEGATIVE PROTEIN, BLOOD, LEUKOCYTES, NITRITE, OR GLUCOSE <1000 mg/dL.  OCCULT BLOOD X 1 CARD TO LAB, STOOL     Status: Normal   Collection Time   06/21/12 10:03 PM      Component Value Range Comment   Fecal Occult Bld POSITIVE       Pt has continued to decline although N/V/D has actually stopped.   No appetite.  Pt with severe coughing and likely rib pain from coughing.  Her CP and right arm pain is worse with movement and  palpation.  Doubt cardiac.  ECG shows no new ischemia.  Troponin is pending.  I suspect will be normal.  However continues to be hypotensive and abd is more painful now, will get CT scan.  Will need admission given age and hypotension which is improved with IVF's.  No fever.  Will check lactic acid.   [name]. Oletta Lamas, MD 06/21/12 2206  [name]. Oletta Lamas, MD 06/21/12 2236

## 2012-06-21 NOTE — ED Notes (Signed)
Report called to [facility] RN room [phone]

## 2012-06-21 NOTE — Progress Notes (Signed)
78 yo F accepted for transfer with N/V/D, weakness, hypotension.

## 2012-06-21 NOTE — ED Notes (Signed)
Pt transported to [facility] via [facility] condition stable

## 2012-06-21 NOTE — ED Notes (Signed)
Patient given warm blankets for comfort. ECG doen and given to MD for confirmation. MD at bedside for eval

## 2012-06-21 NOTE — ED Notes (Addendum)
Pt seen here yesterday for same. Given fluids. Today began c/o CP. EMS called. EKG done. Came here by private vehicle. VS obtained and pt taken to ED8 for EKG.

## 2012-06-22 ENCOUNTER — Encounter (HOSPITAL_COMMUNITY): Payer: Self-pay | Admitting: *Deleted

## 2012-06-22 DIAGNOSIS — D62 Acute posthemorrhagic anemia: Secondary | ICD-10-CM | POA: Diagnosis present

## 2012-06-22 DIAGNOSIS — K922 Gastrointestinal hemorrhage, unspecified: Secondary | ICD-10-CM | POA: Diagnosis present

## 2012-06-22 DIAGNOSIS — R112 Nausea with vomiting, unspecified: Secondary | ICD-10-CM | POA: Diagnosis present

## 2012-06-22 DIAGNOSIS — J9620 Acute and chronic respiratory failure, unspecified whether with hypoxia or hypercapnia: Secondary | ICD-10-CM

## 2012-06-22 DIAGNOSIS — D649 Anemia, unspecified: Secondary | ICD-10-CM | POA: Diagnosis present

## 2012-06-22 LAB — BASIC METABOLIC PANEL
BUN: 12 mg/dL (ref 6–23)
Creatinine, Ser: 0.98 mg/dL (ref 0.50–1.10)
GFR calc Af Amer: 57 mL/min — ABNORMAL LOW (ref >90)
GFR calc non Af Amer: 49 mL/min — ABNORMAL LOW (ref >90)

## 2012-06-22 LAB — CBC
HCT: 23.3 % — ABNORMAL LOW (ref 36.0–46.0)
Hemoglobin: 8.5 g/dL — ABNORMAL LOW (ref 12.0–15.0)
Hemoglobin: 8.4 g/dL — ABNORMAL LOW (ref 12.0–15.0)
MCHC: 33.5 g/dL (ref 30.0–36.0)
MCHC: 33.2 g/dL (ref 30.0–36.0)
MCV: 84.1 fL (ref 78.0–100.0)
MCV: 83.3 fL (ref 78.0–100.0)
MCV: 83 fL (ref 78.0–100.0)
Platelets: 312 10*3/uL (ref 150–400)
Platelets: 352 10*3/uL (ref 150–400)
RBC: 2.88 MIL/uL — ABNORMAL LOW (ref 3.87–5.11)
RBC: 3.08 MIL/uL — ABNORMAL LOW (ref 3.87–5.11)
RBC: 3 MIL/uL — ABNORMAL LOW (ref 3.87–5.11)
RDW: 15.3 % (ref 11.5–15.5)
RDW: 15 % (ref 11.5–15.5)
WBC: 9.3 10*3/uL (ref 4.0–10.5)
WBC: 10.5 10*3/uL (ref 4.0–10.5)
WBC: 11.1 10*3/uL — ABNORMAL HIGH (ref 4.0–10.5)

## 2012-06-22 LAB — ABO/RH: ABO/RH(D): O POS

## 2012-06-22 LAB — GLUCOSE, CAPILLARY: Glucose-Capillary: 95 mg/dL (ref 70–99)

## 2012-06-22 MED ORDER — MELATONIN 10 MG PO TABS: 1.0000 | ORAL_TABLET | Freq: Every day | ORAL | Status: DC

## 2012-06-22 MED ORDER — SODIUM CHLORIDE 0.9 % IJ SOLN
3.0000 mL | Freq: Two times a day (BID) | INTRAMUSCULAR | Status: DC
  Administered 2012-06-22 – 2012-06-25 (×7): 3 mL via INTRAVENOUS

## 2012-06-22 MED ORDER — PANTOPRAZOLE SODIUM 40 MG IV SOLR
40.0000 mg | Freq: Two times a day (BID) | INTRAVENOUS | Status: DC
  Administered 2012-06-22 – 2012-06-23 (×3): 40 mg via INTRAVENOUS
  Filled 2012-06-22 (×4): qty 40

## 2012-06-22 MED ORDER — TOPIRAMATE 25 MG PO TABS
25.0000 mg | ORAL_TABLET | Freq: Every day | ORAL | Status: DC
  Administered 2012-06-22 – 2012-06-24 (×3): 25 mg via ORAL
  Filled 2012-06-22 (×4): qty 1

## 2012-06-22 MED ORDER — ALBUTEROL SULFATE (5 MG/ML) 0.5% IN NEBU: 2.5000 mg | INHALATION_SOLUTION | RESPIRATORY_TRACT | Status: DC | PRN

## 2012-06-22 MED ORDER — CLONIDINE HCL 0.2 MG/24HR TD PTWK: 0.2000 mg | MEDICATED_PATCH | TRANSDERMAL | Status: DC

## 2012-06-22 MED ORDER — HYDROCODONE-ACETAMINOPHEN 5-325 MG PO TABS
1.0000 | ORAL_TABLET | Freq: Four times a day (QID) | ORAL | Status: DC | PRN
  Administered 2012-06-24 – 2012-06-25 (×2): 1 via ORAL
  Filled 2012-06-22 (×2): qty 1

## 2012-06-22 MED ORDER — PNEUMOCOCCAL VAC POLYVALENT 25 MCG/0.5ML IJ INJ
0.5000 mL | INJECTION | INTRAMUSCULAR | Status: AC
  Administered 2012-06-22: 0.5 mL via INTRAMUSCULAR
  Filled 2012-06-22: qty 0.5

## 2012-06-22 MED ORDER — IRBESARTAN 300 MG PO TABS
300.0000 mg | ORAL_TABLET | Freq: Every day | ORAL | Status: DC
  Administered 2012-06-22 – 2012-06-25 (×4): 300 mg via ORAL
  Filled 2012-06-22 (×4): qty 1

## 2012-06-22 MED ORDER — FUROSEMIDE 40 MG PO TABS
40.0000 mg | ORAL_TABLET | Freq: Every day | ORAL | Status: DC
  Administered 2012-06-22 – 2012-06-25 (×4): 40 mg via ORAL
  Filled 2012-06-22 (×4): qty 1

## 2012-06-22 MED ORDER — SODIUM CHLORIDE 0.9 % IV SOLN: INTRAVENOUS | Status: DC

## 2012-06-22 MED ORDER — SODIUM CHLORIDE 0.9 % IJ SOLN: 3.0000 mL | INTRAMUSCULAR | Status: DC | PRN

## 2012-06-22 MED ORDER — AMITRIPTYLINE HCL 50 MG PO TABS
50.0000 mg | ORAL_TABLET | Freq: Every day | ORAL | Status: DC
  Administered 2012-06-22 – 2012-06-24 (×3): 50 mg via ORAL
  Filled 2012-06-22 (×4): qty 1

## 2012-06-22 MED ORDER — TRAMADOL HCL 50 MG PO TABS
50.0000 mg | ORAL_TABLET | Freq: Three times a day (TID) | ORAL | Status: DC | PRN
  Filled 2012-06-22: qty 1

## 2012-06-22 MED ORDER — CLONIDINE HCL 0.1 MG/24HR TD PTWK
0.1000 mg | MEDICATED_PATCH | TRANSDERMAL | Status: DC
  Administered 2012-06-22: 0.1 mg via TRANSDERMAL
  Filled 2012-06-22: qty 1

## 2012-06-22 MED ORDER — PREDNISOLONE ACETATE 1 % OP SUSP
1.0000 [drp] | Freq: Every day | OPHTHALMIC | Status: DC
  Administered 2012-06-22 – 2012-06-25 (×4): 1 [drp] via OPHTHALMIC
  Filled 2012-06-22 (×2): qty 1

## 2012-06-22 MED ORDER — ONDANSETRON 4 MG PO TBDP
4.0000 mg | ORAL_TABLET | Freq: Three times a day (TID) | ORAL | Status: DC | PRN
  Filled 2012-06-22: qty 1

## 2012-06-22 MED ORDER — AMLODIPINE BESYLATE 5 MG PO TABS
5.0000 mg | ORAL_TABLET | Freq: Every day | ORAL | Status: DC
  Administered 2012-06-22 – 2012-06-25 (×4): 5 mg via ORAL
  Filled 2012-06-22 (×4): qty 1

## 2012-06-22 MED ORDER — PANTOPRAZOLE SODIUM 40 MG PO TBEC
40.0000 mg | DELAYED_RELEASE_TABLET | Freq: Every day | ORAL | Status: DC
  Administered 2012-06-22: 40 mg via ORAL

## 2012-06-22 MED ORDER — IPRATROPIUM BROMIDE 0.02 % IN SOLN: 0.5000 mg | RESPIRATORY_TRACT | Status: DC | PRN

## 2012-06-22 MED ORDER — INFLUENZA VIRUS VACC SPLIT PF IM SUSP
0.5000 mL | INTRAMUSCULAR | Status: AC
  Administered 2012-06-22: 0.5 mL via INTRAMUSCULAR
  Filled 2012-06-22: qty 0.5

## 2012-06-22 MED ORDER — METOPROLOL SUCCINATE ER 100 MG PO TB24
100.0000 mg | ORAL_TABLET | Freq: Every day | ORAL | Status: DC
  Administered 2012-06-22 – 2012-06-25 (×4): 100 mg via ORAL
  Filled 2012-06-22 (×4): qty 1

## 2012-06-22 MED ORDER — KETOCONAZOLE 2 % EX CREA: TOPICAL_CREAM | Freq: Two times a day (BID) | CUTANEOUS | Status: DC

## 2012-06-22 MED ORDER — IPRATROPIUM-ALBUTEROL 0.5-2.5 (3) MG/3ML IN SOLN: 3.0000 mL | RESPIRATORY_TRACT | Status: DC | PRN

## 2012-06-22 MED ORDER — SODIUM CHLORIDE 0.9 % IV SOLN
250.0000 mL | INTRAVENOUS | Status: DC | PRN
  Administered 2012-06-23: 500 mL via INTRAVENOUS

## 2012-06-22 MED ORDER — SODIUM CHLORIDE 0.9 % IJ SOLN
3.0000 mL | Freq: Two times a day (BID) | INTRAMUSCULAR | Status: DC
  Administered 2012-06-22 (×3): 3 mL via INTRAVENOUS

## 2012-06-22 NOTE — Progress Notes (Signed)
Nursing Admission Note  Pt arrived to [facility] via [facility] with daughter and son in law at bedside. A&Ox4. No distress noted, VSS. Pt is legally blind and HOH. Denies pain. Telemetry in place. Pt on 2L O2 n/c; states she wears it as needed at home. Skin is intact with some ecchymotic areas noted on upper and lower extremities. ACE bandage noted on left ankle - pt's daughter states she fell about a week ago and has a "really bad sprain." Pt continues to have frequent dark brown/black liquid stools. Oriented to unit and surroundings. Call bell within reach. Will continue to monitor. C.Alaina Donati, RN.

## 2012-06-22 NOTE — Progress Notes (Addendum)
[facility] PROGRESS NOTE  [name] ZOX:[id number] DOB: 05-Aug-1918 DOA: 06/21/2012 PCP: Tandy Gaw, PA-C  HPI 78 year old female, lives at home with daughter, PMH of HTN, HL, bronchiectasis, home when necessary O2, CAD, GERD, PUD, prior episode of GI bleed, OSA, bioprosthetic aVR admitted on 12/1 with history of generalized weakness, cough, nausea, vomiting without coffee-ground or blood, diarrhea and black tarry stools, dyspnea especially on exertion but no chest pain. Recently treated with doxycycline for COPD exacerbation/pneumonia. In the ED, O2 sats apparently dropped to the 80s, hemoglobin dropped from 10.7 on 11/29 to 8.8 g/dL on 81/19. No acute findings on CT abdomen or chest x-ray.  Assessment/Plan: 1. Likely acute upper GI bleed: FOBT positive. Rectal tube with small amount of watery black stools. For now continue clear liquid diet. Add IV Protonix 40 mg every 12 hours. [facility] gastroenterology consulted. Currently hemodynamically stable.? Decreasing 2. Acute blood loss anemia on chronic anemia: Baseline hemoglobin probably in the 11-12 g/dL. Secondary to problem #1. Follow CBCs closely and transfuse when necessary. 3. Acute on chronic respiratory failure: Likely secondary to anemia complicating underlying pulmonary fibrosis, bronchiectasis and COPD. Continue oxygen and monitor. 4. Nausea, vomiting and diarrhea:? Secondary to problem #1 versus rule out C. difficile versus viral gastroenteritis. No CT findings for colitis. On contact isolation. Followup C. difficile PCR. 5. HTN: controlled.Continue her home medications. May have to hold them if decompensates/becomes hypotensive. 6. CAD: asymptomatic of chest pain. Troponin's negative.  7. COPD/bronchiectasis/pulmonary fibrosis: as above. Stable.  Code Status: DNR  Family Communication: discussed with patient. Discussed with [name], patients daughter. Disposition Plan: Home when medically  stable.   Consultants:  [facility]  Procedures:  None  Antibiotics:  None  HPI/Subjective: Complains of some right lower quadrant abdominal pain-intermittent. Denies nausea vomiting. Denies chest pain or dyspnea at this time.  Objective: Filed Vitals:   06/22/12 0451 06/22/12 1057 06/22/12 1321 06/22/12 1645  BP: 147/56 129/50 140/49 137/48  Pulse: 85 69 71 70  Temp: 98.6 F (37 C) 98.3 F (36.8 C) 98.2 F (36.8 C) 98.3 F (36.8 C)  TempSrc: Oral Oral Oral Oral  Resp: 20 18 18 18   Height:      Weight:      SpO2: 100% 98% 100% 98%    Intake/Output Summary (Last 24 hours) at 06/22/12 1833 Last data filed at 06/22/12 1700  Gross per 24 hour  Intake   1120 ml  Output    800 ml  Net    320 ml   Filed Weights   06/21/12 1558 06/22/12 0017  Weight: 63.957 kg (141 lb) 66.4 kg (146 lb 6.2 oz)    Exam:   General exam: Small built and thinly nourished female patient lying comfortably supine in bed.  Respiratory system: Basal course, chronic sounding crackles. Rest of lung fields clear to auscultation. No increased work of breathing.  Cardiovascular system: First and second heart sounds heard, regular rate and rhythm. No JVD, murmurs or gallops. Telemetry shows sinus rhythm and first degree AV block.  Gastrointestinal system: Abdomen is nondistended, soft and mild tenderness in right lower quadrant. No rigidity guarding or rebound. Normal bowel sounds heard. No organomegaly or masses appreciated. Patient has a flexiseal with small amount of liquid black stools.  CNS: Alert and oriented. No focal neurological deficits.  Extremities: Symmetric 5 x 5 power.   Data Reviewed: Basic Metabolic Panel:  Lab 06/22/12 1478 06/21/12 1615 06/20/12 1118  NA 137 132* 136  K 3.6 3.5 3.7  CL 107 99 97  CO2 23 22 26   GLUCOSE 93 110* 131*  BUN 12 18 28*  CREATININE 0.98 1.00 1.10  CALCIUM 8.5 8.6 9.2  MG -- -- --  PHOS -- -- --   Liver Function Tests:  Lab  06/20/12 1118  AST 22  ALT 13  ALKPHOS 102  BILITOT 0.2*  PROT 7.3  ALBUMIN 3.0*    Lab 06/20/12 1118  LIPASE 25  AMYLASE --   No results found for this basename: AMMONIA:5 in the last 168 hours CBC:  Lab 06/22/12 1554 06/22/12 1115 06/22/12 0640 06/21/12 1615 06/20/12 1118  WBC 10.5 9.3 9.9 14.5* 13.7*  NEUTROABS -- -- -- 11.2* 10.9*  HGB 8.5* 7.9* 7.8* 8.8* 10.7*  HCT 25.6* 24.0* 23.3* 26.2* 31.4*  MCV 83.1 83.3 84.1 82.9 81.6  PLT 320 312 298 390 440*   Cardiac Enzymes:  Lab 06/22/12 1554 06/22/12 0920 06/22/12 0640 06/21/12 1615  CKTOTAL -- -- -- --  CKMB -- -- -- --  CKMBINDEX -- -- -- --  TROPONINI <0.30 <0.30 <0.30 <0.30   BNP (last 3 results)  Basename 11/21/11 1040  PROBNP 140.0*   CBG: No results found for this basename: GLUCAP:5 in the last 168 hours  No results found for this or any previous visit (from the past 240 hour(s)).   Studies: Dg Ribs Unilateral W/chest Right  06/21/2012  *RADIOLOGY REPORT*  Clinical Data: Right-sided chest pain.  Shortness of breath. Generalized weakness.  RIGHT RIBS AND CHEST - 3+ VIEW  Comparison: Two-view chest x-ray 05/14/2012, 11/21/2011.  No prior rib imaging.  Findings: No fractures identified involving the right ribs. Osteopenia.  Costal cartilage calcification.  Cardiac silhouette upper normal in size to slightly enlarged but stable.  Thoracic aorta atherosclerotic, unchanged.  Hilar and mediastinal contours otherwise unremarkable.  Changes of pulmonary fibrosis in both lungs, right greater than left, with pleuroparenchymal scarring at both lung bases and parenchymal scarring in the right upper lobe, unchanged.  No new pulmonary parenchymal abnormalities.  IMPRESSION:  1.  No right rib fractures identified. 2.  No acute cardiopulmonary disease.  Stable changes of pulmonary fibrosis, pleuroparenchymal scarring at the lung bases, and parenchymal scarring in the right upper lobe.   Original Report Authenticated By: Hulan Saas, M.D.    Ct Abdomen Pelvis W Contrast  06/21/2012  *RADIOLOGY REPORT*  Clinical Data: .  Abdominal pain and diarrhea.  CT ABDOMEN AND PELVIS WITH CONTRAST  Technique:  Multidetector CT imaging of the abdomen and pelvis was performed following the standard protocol during bolus administration of intravenous contrast.  Contrast: OMNIPAQUE IOHEXOL 300 MG/ML  SOLN  Comparison: None.  Findings: Pulmonary scarring in both lung bases.  Small right pleural effusion.  Liver and bile ducts are normal.  Gallbladder has been removed. The pancreas is atrophic.  The spleen is normal.  Kidneys show no obstruction or mass.  2.5 cm right renal cyst.  No renal calculi. There is liquid stool in the colon.  Small bowel is not dilated. No free fluid in the abdomen.  No mass or adenopathy.  No acute bony abnormality.  IMPRESSION: Bibasilar lung scarring.  Small right pleural effusion.  Liquid stool in the colon may represent gastroenteritis.  No bowel obstruction.  Sigmoid diverticulosis without diverticulitis.   Original Report Authenticated By: Janeece Riggers, M.D.     Scheduled Meds:    . amitriptyline  50 mg Oral QHS  . amLODipine  5 mg Oral Daily  . cloNIDine  0.1 mg Transdermal Weekly  . [COMPLETED] fentaNYL  50 mcg Intravenous Once  . furosemide  40 mg Oral Daily  . [COMPLETED] influenza  inactive virus vaccine  0.5 mL Intramuscular Tomorrow-1000  . irbesartan  300 mg Oral Daily  . metoprolol succinate  100 mg Oral Daily  . pantoprazole (PROTONIX) IV  40 mg Intravenous Q12H  . [COMPLETED] pneumococcal 23 valent vaccine  0.5 mL Intramuscular Tomorrow-1000  . prednisoLONE acetate  1 drop Both Eyes Daily  . sodium chloride  3 mL Intravenous Q12H  . sodium chloride  3 mL Intravenous Q12H  . topiramate  25 mg Oral QHS  . [DISCONTINUED] cloNIDine  0.2 mg Transdermal Weekly  . [DISCONTINUED] ketoconazole   Topical BID  . [DISCONTINUED] Melatonin  1 tablet Oral QHS  . [DISCONTINUED] pantoprazole  40  mg Oral Daily   Continuous Infusions:   Principal Problem:  *Nausea vomiting and diarrhea Active Problems:  Hypertension  Hyperlipidemia  Bronchiectasis  GI bleed  Anemia  Acute blood loss anemia  Respiratory failure, acute-on-chronic    Time spent: 45 minutes    [employer]  [facility] Pager [phone]. If 8PM-8AM, please contact night-coverage at [email or web address], password [facility] 06/22/2012, 6:33 PM  LOS: 1 day

## 2012-06-22 NOTE — H&P (Signed)
[facility] History and Physical  [name] WUJ:[id number] DOB: Dec 10, 1918 DOA: 06/21/2012  Referring physician: ED PCP: Tandy Gaw, PA-C  Specialists: None  Chief Complaint: Diarrhea, weakness  HPI: [name] is a 78 y.o. female who presents with c/o gradually worsening generalized weakness with associated cough, nausea, emesis, and diarrhea.  Also has associated SOB but not CP she states this is worse with walking and coughing and better at rest.  Her GI symptoms occur in the context of being on doxycycline at the end of October for COPD exacerbation / PNA at that time (although called a COPD exacerbation, my read of her CXR is more consistant with PNA).  Despite her worsening SOB however, her CXR appears dramatically improved when compared to 05/14/12.  Her stool is black and tarry in nature, although vomiting is NBNB.  In the ED at the outside facility her HGB was noted to have dropped from 10.7 to 8.8 in 24 hours, her stool was noted to be heme positive.  She was given fluids, but when they tried to ambulate her she desaturated down into the upper 80s.  Given this she was sent over to the hospitalist service here at [facility] for observation.  Review of Systems: No fevers, chills, sweats, abd pain, 12 systems reviewed and negative.  Past Medical History  Diagnosis Date  . Hypertension   . Hyperlipidemia   . OSA (obstructive sleep apnea)     not on CPAP  . Bronchiectasis   . Aortic stenosis   . CAD (coronary artery disease)     Present PCI and MI  . GERD (gastroesophageal reflux disease)   . PUD (peptic ulcer disease)   . Legally blind   . Scleroderma    Past Surgical History  Procedure Date  . Cardiac valve replacement 2011  . Coronary artery bypass graft 2011  . Left shoulder surgery   . Corneal transplant   . Cholecystectomy   . Appendectomy   . Abdominal hysterectomy   . Total knee arthroplasty    Social History:  reports that she has never smoked. She has  never used smokeless tobacco. She reports that she does not drink alcohol or use illicit drugs.   Allergies  Allergen Reactions  . Levofloxacin Rash    Family History  Problem Relation Age of Onset  . Heart disease Sister   . Heart disease Brother     MI x 2  . Lung disease Brother     ? dz, was a smoker    Prior to Admission medications   Medication Sig Start Date End Date Taking? Authorizing Provider  AMBULATORY NON FORMULARY MEDICATION Nebulizer machine. Dx: COPD, SOB, cough 05/19/12   Jomarie Longs, PA-C  amitriptyline (ELAVIL) 50 MG tablet TAKE 1 TABLET AT BEDTIME 06/15/12   Jade L Breeback, PA-C  amLODipine (NORVASC) 5 MG tablet Take 1 tablet (5 mg total) by mouth daily. 06/04/12 06/04/13  Jomarie Longs, PA-C  amoxicillin-clarithromycin-lansoprazole (PREVPAC) combo pack Take by mouth 2 (two) times daily. Follow package directions. 05/30/12   Jade L Breeback, PA-C  aspirin-acetaminophen-caffeine (EXCEDRIN MIGRAINE) [id number] MG per tablet Take 1 tablet by mouth every 6 (six) hours as needed.    Historical Provider, MD  cloNIDine (CATAPRES - DOSED IN MG/24 HR) 0.1 mg/24hr patch Place 1 patch (0.1 mg total) onto the skin once a week. 06/06/12   Jade L Breeback, PA-C  cloNIDine (CATAPRES - DOSED IN MG/24 HR) 0.2 mg/24hr patch Place 1 patch (0.2 mg total)  onto the skin once a week. 05/14/12   Jade L Breeback, PA-C  doxycycline (VIBRAMYCIN) 100 MG capsule Take 1 capsule (100 mg total) by mouth 2 (two) times daily. For 10 days. 05/14/12   Jade L Breeback, PA-C  furosemide (LASIX) 40 MG tablet Take 1 tablet (40 mg total) by mouth daily. 04/30/12   Jade L Breeback, PA-C  HYDROcodone-acetaminophen (NORCO/VICODIN) 5-325 MG per tablet Take 1 tablet by mouth every 6 (six) hours as needed for pain. 06/18/12   Jade L Breeback, PA-C  HYDROcodone-acetaminophen (VICODIN) 5-500 MG per tablet Take 1 tablet by mouth every 8 (eight) hours as needed for pain. 06/14/12   Doree Albee, MD    Ibuprofen-Diphenhydramine Cit (ADVIL PM PO) Take 1 tablet by mouth at bedtime as needed.    Historical Provider, MD  ipratropium-albuterol (DUONEB) 0.5-2.5 (3) MG/3ML SOLN Take 3 mLs by nebulization every 4 (four) hours as needed. 05/21/12   Jade L Breeback, PA-C  ketoconazole (NIZORAL) 2 % cream Apply topically 2 (two) times daily. For 2-4 weeks to affected areas. 03/26/12 03/26/13  Jomarie Longs, PA-C  Melatonin 10 MG TABS Take 1 tablet by mouth at bedtime.    Historical Provider, MD  meloxicam (MOBIC) 15 MG tablet Take 1 tablet (15 mg total) by mouth daily as needed for pain. 05/29/12   Jade L Breeback, PA-C  metoprolol succinate (TOPROL-XL) 100 MG 24 hr tablet Take 1 tablet (100 mg total) by mouth daily. Take with or immediately following a meal. 04/11/12   Jade L Breeback, PA-C  ondansetron (ZOFRAN ODT) 4 MG disintegrating tablet Take 1 tablet (4 mg total) by mouth every 8 (eight) hours as needed for nausea. 06/20/12   Roxy Horseman, PA-C  pantoprazole (PROTONIX) 40 MG tablet Take 1 tablet (40 mg total) by mouth daily. 04/11/12 04/11/13  Jade L Breeback, PA-C  prednisoLONE acetate (PRED FORTE) 1 % ophthalmic suspension Place 1 drop into both eyes daily. 01/29/12   Lewayne Bunting, MD  topiramate (TOPAMAX) 25 MG tablet Take 1 tablet (25 mg total) by mouth at bedtime. 06/04/12   Jade L Breeback, PA-C  traMADol (ULTRAM) 50 MG tablet Take 1 tablet (50 mg total) by mouth every 8 (eight) hours as needed for pain. 05/28/12   Jade L Breeback, PA-C  valsartan (DIOVAN) 320 MG tablet Take 1 tablet (320 mg total) by mouth daily. 01/28/12   Lewayne Bunting, MD   Physical Exam: Filed Vitals:   06/21/12 1945 06/21/12 2310 06/22/12 0010 06/22/12 0017  BP:  108/58  127/50  Pulse: 86 70  71  Temp:  98.8 F (37.1 C)  97.9 F (36.6 C)  TempSrc:  Oral  Oral  Resp: 20   20  Height:   5' (1.524 m)   Weight:    66.4 kg (146 lb 6.2 oz)  SpO2: 80%   98%    General:  NAD, resting comfortably in bed Eyes: PEERLA  EOMI ENT: mucous membranes moist Neck: supple w/o JVD Cardiovascular: RRR w/o MRG Respiratory: CTA B Abdomen: soft, nt, nd, bs+ Skin: no rash nor lesion Musculoskeletal: MAE, full ROM all 4 extremities Psychiatric: normal tone and affect Neurologic: AAOx3, grossly non-focal  Labs on Admission:  Basic Metabolic Panel:  Lab 06/21/12 7253 06/20/12 1118  NA 132* 136  K 3.5 3.7  CL 99 97  CO2 22 26  GLUCOSE 110* 131*  BUN 18 28*  CREATININE 1.00 1.10  CALCIUM 8.6 9.2  MG -- --  PHOS -- --  Liver Function Tests:  Lab 06/20/12 1118  AST 22  ALT 13  ALKPHOS 102  BILITOT 0.2*  PROT 7.3  ALBUMIN 3.0*    Lab 06/20/12 1118  LIPASE 25  AMYLASE --   No results found for this basename: AMMONIA:5 in the last 168 hours CBC:  Lab 06/21/12 1615 06/20/12 1118  WBC 14.5* 13.7*  NEUTROABS 11.2* 10.9*  HGB 8.8* 10.7*  HCT 26.2* 31.4*  MCV 82.9 81.6  PLT 390 440*   Cardiac Enzymes:  Lab 06/21/12 1615  CKTOTAL --  CKMB --  CKMBINDEX --  TROPONINI <0.30    BNP (last 3 results)  Basename 11/21/11 1040  PROBNP 140.0*   CBG: No results found for this basename: GLUCAP:5 in the last 168 hours  Radiological Exams on Admission: Dg Ribs Unilateral W/chest Right  06/21/2012  *RADIOLOGY REPORT*  Clinical Data: Right-sided chest pain.  Shortness of breath. Generalized weakness.  RIGHT RIBS AND CHEST - 3+ VIEW  Comparison: Two-view chest x-ray 05/14/2012, 11/21/2011.  No prior rib imaging.  Findings: No fractures identified involving the right ribs. Osteopenia.  Costal cartilage calcification.  Cardiac silhouette upper normal in size to slightly enlarged but stable.  Thoracic aorta atherosclerotic, unchanged.  Hilar and mediastinal contours otherwise unremarkable.  Changes of pulmonary fibrosis in both lungs, right greater than left, with pleuroparenchymal scarring at both lung bases and parenchymal scarring in the right upper lobe, unchanged.  No new pulmonary parenchymal  abnormalities.  IMPRESSION:  1.  No right rib fractures identified. 2.  No acute cardiopulmonary disease.  Stable changes of pulmonary fibrosis, pleuroparenchymal scarring at the lung bases, and parenchymal scarring in the right upper lobe.   Original Report Authenticated By: Hulan Saas, M.D.    Ct Abdomen Pelvis W Contrast  06/21/2012  *RADIOLOGY REPORT*  Clinical Data: .  Abdominal pain and diarrhea.  CT ABDOMEN AND PELVIS WITH CONTRAST  Technique:  Multidetector CT imaging of the abdomen and pelvis was performed following the standard protocol during bolus administration of intravenous contrast.  Contrast: OMNIPAQUE IOHEXOL 300 MG/ML  SOLN  Comparison: None.  Findings: Pulmonary scarring in both lung bases.  Small right pleural effusion.  Liver and bile ducts are normal.  Gallbladder has been removed. The pancreas is atrophic.  The spleen is normal.  Kidneys show no obstruction or mass.  2.5 cm right renal cyst.  No renal calculi. There is liquid stool in the colon.  Small bowel is not dilated. No free fluid in the abdomen.  No mass or adenopathy.  No acute bony abnormality.  IMPRESSION: Bibasilar lung scarring.  Small right pleural effusion.  Liquid stool in the colon may represent gastroenteritis.  No bowel obstruction.  Sigmoid diverticulosis without diverticulitis.   Original Report Authenticated By: Janeece Riggers, M.D.     EKG: Independently reviewed.  Assessment/Plan Principal Problem:  *Nausea vomiting and diarrhea Active Problems:  GI bleed  Anemia   1. N/V/D - CT scan didn't demonstrate obvious colitis, the DDX at this point includes: 1. Primary GI bleed - causing thinning of the stool and presentation as diarrhea, will recheck a CBC this morning and if nessacary will transfuse blood, some component of this may be dilutional as she probably received fluids during her 2 visits to the ED in the past 48 hours and despite the N/V/D she does not appear dehydrated at this point  (which is why I am not putting her on fluids right away). 2. Viral gastroenteritis - very possible, this does  seem to be going around town 3. C.Diff colitis - the lack of colitis on CT scan is somewhat reassuring, never the less will certainly test for this given the multiple ABX medications she has been on in the past Month (most recently was on last week per patient).  Not empirically treating this yet however as not really strong enough evidence to make a clinical diagnosis of this. 2. Anemia - acute on chronic, rechecking CBC, likely some component of blood loss in stool and possibly a small component dilutional.  Hemodynamically stable, not tachycardic. 3. GI bleed - infectious causes vs non-infectious - patient also has known pyloric ulcer, probably will want to call GI in the AM, no evidence for massive bleed and patient is hemodynamically stable at time of admit. 4. H/o HTN, hyperlipidemia - will continue home meds 5. COPD - does not appear to be in acute exacerbation at this point, despite the desats with ambulation she is satting well at rest, CXR appears improved from 10/23.  Monitor respiratory function for now.   Code Status: DNR per patient (must indicate code status--if unknown or must be presumed, indicate so) Family Communication: No family in room (indicate person spoken with, if applicable, with phone number if by telephone) Disposition Plan: Admit to obs (indicate anticipated LOS)  Time spent: 70 min  GARDNER, JARED M. [facility] Pager [phone]  If 7PM-7AM, please contact night-coverage [email or web address] Password [email or web address] 06/22/2012, 3:17 AM

## 2012-06-22 NOTE — Progress Notes (Signed)

## 2012-06-22 NOTE — Progress Notes (Signed)
Rectal pouch placed on patient due to frequency of liquid stools. Pt states her rectum is becoming sore. Will continue to monitor. C.Salvatore Shear, RN.

## 2012-06-22 NOTE — Consult Note (Signed)
[facility] Consult Note  Referring Provider: No ref. provider found Primary Care Physician:  Tandy Gaw, PA-C Primary Gastroenterologist:  Dr.  Antony Contras Complaint: Nausea vomiting dark stools and anemia HPI: [name] is an 78 y.o. white female  who presents with 2 or 3 day history of nausea vomiting epigastric and chest pain with diarrhea. There was a report that her stool is black although the patient is legally blind and cannot confirm this. Her hemoglobin was 10.7 on November 29 but has fallen to 7.9 and her stool has been documented heme positive. Her BUN is normal. Her C. difficile toxin is pending. She has never had any history of peptic ulcer disease or EGD to her daughter is aware of.  Past Medical History  Diagnosis Date  . Hypertension   . Hyperlipidemia   . OSA (obstructive sleep apnea)     not on CPAP  . Bronchiectasis   . Aortic stenosis   . CAD (coronary artery disease)     Present PCI and MI  . GERD (gastroesophageal reflux disease)   . PUD (peptic ulcer disease)   . Legally blind   . Scleroderma     Past Surgical History  Procedure Date  . Cardiac valve replacement 2011  . Coronary artery bypass graft 2011  . Left shoulder surgery   . Corneal transplant   . Cholecystectomy   . Appendectomy   . Abdominal hysterectomy   . Total knee arthroplasty     Medications Prior to Admission  Medication Sig Dispense Refill  . AMBULATORY NON FORMULARY MEDICATION Nebulizer machine. Dx: COPD, SOB, cough  1 each  0  . amitriptyline (ELAVIL) 50 MG tablet TAKE 1 TABLET AT BEDTIME  90 tablet  0  . amLODipine (NORVASC) 5 MG tablet Take 1 tablet (5 mg total) by mouth daily.  90 tablet  0  . amoxicillin-clarithromycin-lansoprazole (PREVPAC) combo pack Take by mouth 2 (two) times daily. Follow package directions.  1 kit  0  . aspirin-acetaminophen-caffeine (EXCEDRIN MIGRAINE) 250-250-65 MG per tablet Take 1 tablet by mouth every 6 (six) hours as needed.      .  cloNIDine (CATAPRES - DOSED IN MG/24 HR) 0.1 mg/24hr patch Place 1 patch (0.1 mg total) onto the skin once a week.  12 patch  1  . cloNIDine (CATAPRES - DOSED IN MG/24 HR) 0.2 mg/24hr patch Place 1 patch (0.2 mg total) onto the skin once a week.  12 patch  2  . doxycycline (VIBRAMYCIN) 100 MG capsule Take 1 capsule (100 mg total) by mouth 2 (two) times daily. For 10 days.  20 capsule  0  . furosemide (LASIX) 40 MG tablet Take 1 tablet (40 mg total) by mouth daily.  30 tablet  0  . HYDROcodone-acetaminophen (NORCO/VICODIN) 5-325 MG per tablet Take 1 tablet by mouth every 6 (six) hours as needed for pain.  15 tablet  0  . HYDROcodone-acetaminophen (VICODIN) 5-500 MG per tablet Take 1 tablet by mouth every 8 (eight) hours as needed for pain.  15 tablet  0  . Ibuprofen-Diphenhydramine Cit (ADVIL PM PO) Take 1 tablet by mouth at bedtime as needed.      Marland Kitchen ipratropium-albuterol (DUONEB) 0.5-2.5 (3) MG/3ML SOLN Take 3 mLs by nebulization every 4 (four) hours as needed.  360 mL  11  . ketoconazole (NIZORAL) 2 % cream Apply topically 2 (two) times daily. For 2-4 weeks to affected areas.  15 g  0  . Melatonin 10 MG TABS Take 1 tablet by  mouth at bedtime.      . meloxicam (MOBIC) 15 MG tablet Take 1 tablet (15 mg total) by mouth daily as needed for pain.  30 tablet  3  . metoprolol succinate (TOPROL-XL) 100 MG 24 hr tablet Take 1 tablet (100 mg total) by mouth daily. Take with or immediately following a meal.  90 tablet  0  . ondansetron (ZOFRAN ODT) 4 MG disintegrating tablet Take 1 tablet (4 mg total) by mouth every 8 (eight) hours as needed for nausea.  10 tablet  0  . pantoprazole (PROTONIX) 40 MG tablet Take 1 tablet (40 mg total) by mouth daily.  90 tablet  1  . prednisoLONE acetate (PRED FORTE) 1 % ophthalmic suspension Place 1 drop into both eyes daily.  5 mL  4  . topiramate (TOPAMAX) 25 MG tablet Take 1 tablet (25 mg total) by mouth at bedtime.  90 tablet  0  . traMADol (ULTRAM) 50 MG tablet Take 1  tablet (50 mg total) by mouth every 8 (eight) hours as needed for pain.  30 tablet  1  . valsartan (DIOVAN) 320 MG tablet Take 1 tablet (320 mg total) by mouth daily.  90 tablet  4    Allergies:  Allergies  Allergen Reactions  . Levofloxacin Rash    Family History  Problem Relation Age of Onset  . Heart disease Sister   . Heart disease Brother     MI x 2  . Lung disease Brother     ? dz, was a smoker    Social History:  reports that she has never smoked. She has never used smokeless tobacco. She reports that she does not drink alcohol or use illicit drugs.  Review of Systems: negative except as above   Blood pressure 129/50, pulse 69, temperature 98.3 F (36.8 C), temperature source Oral, resp. rate 18, height 5' (1.524 m), weight 66.4 kg (146 lb 6.2 oz), SpO2 98.00%. Head: Normocephalic, without obvious abnormality, atraumatic Neck: no adenopathy, no carotid bruit, no JVD, supple, symmetrical, trachea midline and thyroid not enlarged, symmetric, no tenderness/mass/nodules Resp: clear to auscultation bilaterally Cardio: regular rate and rhythm, S1, S2 normal, no murmur, click, rub or gallop GI: Abdomen soft nondistended with normoactive bowel sounds. No hepatomegaly masses or guarding. Extremities: extremities normal, atraumatic, no cyanosis or edema  Results for orders placed during the hospital encounter of 06/21/12 (from the past 48 hour(s))  CBC WITH DIFFERENTIAL     Status: Abnormal   Collection Time   06/21/12  4:15 PM      Component Value Range Comment   WBC 14.5 (*) 4.0 - 10.5 K/uL    RBC 3.16 (*) 3.87 - 5.11 MIL/uL    Hemoglobin 8.8 (*) 12.0 - 15.0 g/dL    HCT 82.9 (*) 56.2 - 46.0 %    MCV 82.9  78.0 - 100.0 fL    MCH 27.8  26.0 - 34.0 pg    MCHC 33.6  30.0 - 36.0 g/dL    RDW 13.0  86.5 - 78.4 %    Platelets 390  150 - 400 K/uL    Neutrophils Relative 77  43 - 77 %    Neutro Abs 11.2 (*) 1.7 - 7.7 K/uL    Lymphocytes Relative 15  12 - 46 %    Lymphs Abs 2.2   0.7 - 4.0 K/uL    Monocytes Relative 7  3 - 12 %    Monocytes Absolute 1.0  0.1 - 1.0 K/uL  Eosinophils Relative 1  0 - 5 %    Eosinophils Absolute 0.1  0.0 - 0.7 K/uL    Basophils Relative 0  0 - 1 %    Basophils Absolute 0.0  0.0 - 0.1 K/uL   BASIC METABOLIC PANEL     Status: Abnormal   Collection Time   06/21/12  4:15 PM      Component Value Range Comment   Sodium 132 (*) 135 - 145 mEq/L    Potassium 3.5  3.5 - 5.1 mEq/L    Chloride 99  96 - 112 mEq/L    CO2 22  19 - 32 mEq/L    Glucose, Bld 110 (*) 70 - 99 mg/dL    BUN 18  6 - 23 mg/dL    Creatinine, Ser 1.61  0.50 - 1.10 mg/dL    Calcium 8.6  8.4 - 09.6 mg/dL    GFR calc non Af Amer 48 (*) >90 mL/min    GFR calc Af Amer 55 (*) >90 mL/min   TROPONIN I     Status: Normal   Collection Time   06/21/12  4:15 PM      Component Value Range Comment   Troponin I <0.30  <0.30 ng/mL   LACTIC ACID, PLASMA     Status: Normal   Collection Time   06/21/12  5:25 PM      Component Value Range Comment   Lactic Acid, Venous 1.4  0.5 - 2.2 mmol/L   URINALYSIS, ROUTINE W REFLEX MICROSCOPIC     Status: Normal   Collection Time   06/21/12  6:18 PM      Component Value Range Comment   Color, Urine YELLOW  YELLOW    APPearance CLEAR  CLEAR    Specific Gravity, Urine 1.009  1.005 - 1.030    pH 6.0  5.0 - 8.0    Glucose, UA NEGATIVE  NEGATIVE mg/dL    Hgb urine dipstick NEGATIVE  NEGATIVE    Bilirubin Urine NEGATIVE  NEGATIVE    Ketones, ur NEGATIVE  NEGATIVE mg/dL    Protein, ur NEGATIVE  NEGATIVE mg/dL    Urobilinogen, UA 0.2  0.0 - 1.0 mg/dL    Nitrite NEGATIVE  NEGATIVE    Leukocytes, UA NEGATIVE  NEGATIVE MICROSCOPIC NOT DONE ON URINES WITH NEGATIVE PROTEIN, BLOOD, LEUKOCYTES, NITRITE, OR GLUCOSE <1000 mg/dL.  OCCULT BLOOD X 1 CARD TO LAB, STOOL     Status: Normal   Collection Time   06/21/12 10:03 PM      Component Value Range Comment   Fecal Occult Bld POSITIVE     CBC     Status: Abnormal   Collection Time   06/22/12  6:40 AM       Component Value Range Comment   WBC 9.9  4.0 - 10.5 K/uL    RBC 2.77 (*) 3.87 - 5.11 MIL/uL    Hemoglobin 7.8 (*) 12.0 - 15.0 g/dL    HCT 04.5 (*) 40.9 - 46.0 %    MCV 84.1  78.0 - 100.0 fL    MCH 28.2  26.0 - 34.0 pg    MCHC 33.5  30.0 - 36.0 g/dL    RDW 81.1  91.4 - 78.2 %    Platelets 298  150 - 400 K/uL   BASIC METABOLIC PANEL     Status: Abnormal   Collection Time   06/22/12  6:40 AM      Component Value Range Comment   Sodium 137  135 -  145 mEq/L    Potassium 3.6  3.5 - 5.1 mEq/L    Chloride 107  96 - 112 mEq/L    CO2 23  19 - 32 mEq/L    Glucose, Bld 93  70 - 99 mg/dL    BUN 12  6 - 23 mg/dL    Creatinine, Ser 5.28  0.50 - 1.10 mg/dL    Calcium 8.5  8.4 - 41.3 mg/dL    GFR calc non Af Amer 49 (*) >90 mL/min    GFR calc Af Amer 57 (*) >90 mL/min   TROPONIN I     Status: Normal   Collection Time   06/22/12  6:40 AM      Component Value Range Comment   Troponin I <0.30  <0.30 ng/mL   TYPE AND SCREEN     Status: Normal   Collection Time   06/22/12  9:10 AM      Component Value Range Comment   ABO/RH(D) O POS      Antibody Screen NEG      Sample Expiration 06/25/2012     ABO/RH     Status: Normal   Collection Time   06/22/12  9:10 AM      Component Value Range Comment   ABO/RH(D) O POS     TROPONIN I     Status: Normal   Collection Time   06/22/12  9:20 AM      Component Value Range Comment   Troponin I <0.30  <0.30 ng/mL   CBC     Status: Abnormal   Collection Time   06/22/12 11:15 AM      Component Value Range Comment   WBC 9.3  4.0 - 10.5 K/uL    RBC 2.88 (*) 3.87 - 5.11 MIL/uL    Hemoglobin 7.9 (*) 12.0 - 15.0 g/dL    HCT 24.4 (*) 01.0 - 46.0 %    MCV 83.3  78.0 - 100.0 fL    MCH 27.4  26.0 - 34.0 pg    MCHC 32.9  30.0 - 36.0 g/dL    RDW 27.2  53.6 - 64.4 %    Platelets 312  150 - 400 K/uL   PROTIME-INR     Status: Normal   Collection Time   06/22/12 11:15 AM      Component Value Range Comment   Prothrombin Time 13.6  11.6 - 15.2 seconds    INR  1.05  0.00 - 1.49    Dg Ribs Unilateral W/chest Right  06/21/2012  *RADIOLOGY REPORT*  Clinical Data: Right-sided chest pain.  Shortness of breath. Generalized weakness.  RIGHT RIBS AND CHEST - 3+ VIEW  Comparison: Two-view chest x-ray 05/14/2012, 11/21/2011.  No prior rib imaging.  Findings: No fractures identified involving the right ribs. Osteopenia.  Costal cartilage calcification.  Cardiac silhouette upper normal in size to slightly enlarged but stable.  Thoracic aorta atherosclerotic, unchanged.  Hilar and mediastinal contours otherwise unremarkable.  Changes of pulmonary fibrosis in both lungs, right greater than left, with pleuroparenchymal scarring at both lung bases and parenchymal scarring in the right upper lobe, unchanged.  No new pulmonary parenchymal abnormalities.  IMPRESSION:  1.  No right rib fractures identified. 2.  No acute cardiopulmonary disease.  Stable changes of pulmonary fibrosis, pleuroparenchymal scarring at the lung bases, and parenchymal scarring in the right upper lobe.   Original Report Authenticated By: Hulan Saas, M.D.    Ct Abdomen Pelvis W Contrast  06/21/2012  *RADIOLOGY REPORT*  Clinical Data: .  Abdominal pain and diarrhea.  CT ABDOMEN AND PELVIS WITH CONTRAST  Technique:  Multidetector CT imaging of the abdomen and pelvis was performed following the standard protocol during bolus administration of intravenous contrast.  Contrast: OMNIPAQUE IOHEXOL 300 MG/ML  SOLN  Comparison: None.  Findings: Pulmonary scarring in both lung bases.  Small right pleural effusion.  Liver and bile ducts are normal.  Gallbladder has been removed. The pancreas is atrophic.  The spleen is normal.  Kidneys show no obstruction or mass.  2.5 cm right renal cyst.  No renal calculi. There is liquid stool in the colon.  Small bowel is not dilated. No free fluid in the abdomen.  No mass or adenopathy.  No acute bony abnormality.  IMPRESSION: Bibasilar lung scarring.  Small right pleural  effusion.  Liquid stool in the colon may represent gastroenteritis.  No bowel obstruction.  Sigmoid diverticulosis without diverticulitis.   Original Report Authenticated By: Janeece Riggers, M.D.     Assessment: Nausea vomiting and abdominal pain with suggestion of possible upper GI bleeding. Plan:  Will plan to go ahead and proceed with EGD later today. Jillian Pianka C 06/22/2012, 12:18 PM

## 2012-06-22 NOTE — Progress Notes (Signed)
Admitting MD at bedside. C.Kailen Name, RN.

## 2012-06-23 ENCOUNTER — Encounter (HOSPITAL_COMMUNITY): Payer: Self-pay | Admitting: *Deleted

## 2012-06-23 ENCOUNTER — Encounter (HOSPITAL_COMMUNITY)
Admission: EM | Disposition: A | Discharge status: Skilled Nursing Facility | Payer: Self-pay | Source: Home / Self Care | Attending: Internal Medicine

## 2012-06-23 DIAGNOSIS — E876 Hypokalemia: Secondary | ICD-10-CM | POA: Diagnosis not present

## 2012-06-23 HISTORY — PX: ESOPHAGOGASTRODUODENOSCOPY: SHX5428

## 2012-06-23 LAB — CBC
HCT: 24.2 % — ABNORMAL LOW (ref 36.0–46.0)
Hemoglobin: 8.4 g/dL — ABNORMAL LOW (ref 12.0–15.0)
Platelets: 334 10*3/uL (ref 150–400)
RBC: 2.89 MIL/uL — ABNORMAL LOW (ref 3.87–5.11)
RBC: 3.05 MIL/uL — ABNORMAL LOW (ref 3.87–5.11)
RDW: 15.2 % (ref 11.5–15.5)
WBC: 7.9 10*3/uL (ref 4.0–10.5)

## 2012-06-23 LAB — BASIC METABOLIC PANEL
BUN: 11 mg/dL (ref 6–23)
Chloride: 104 mEq/L (ref 96–112)
GFR calc Af Amer: 56 mL/min — ABNORMAL LOW (ref >90)
Potassium: 3.3 mEq/L — ABNORMAL LOW (ref 3.5–5.1)

## 2012-06-23 SURGERY — EGD (ESOPHAGOGASTRODUODENOSCOPY)
Anesthesia: Moderate Sedation

## 2012-06-23 MED ORDER — MIDAZOLAM HCL 5 MG/ML IJ SOLN
INTRAMUSCULAR | Status: AC
  Filled 2012-06-23: qty 2

## 2012-06-23 MED ORDER — FENTANYL CITRATE 0.05 MG/ML IJ SOLN
INTRAMUSCULAR | Status: AC
  Filled 2012-06-23: qty 2

## 2012-06-23 MED ORDER — BOOST / RESOURCE BREEZE PO LIQD
1.0000 | Freq: Three times a day (TID) | ORAL | Status: DC
  Administered 2012-06-23 – 2012-06-25 (×6): 1 via ORAL

## 2012-06-23 MED ORDER — MIDAZOLAM HCL 10 MG/2ML IJ SOLN
INTRAMUSCULAR | Status: DC | PRN
  Administered 2012-06-23 (×2): 1 mg via INTRAVENOUS

## 2012-06-23 MED ORDER — PANTOPRAZOLE SODIUM 40 MG PO TBEC
40.0000 mg | DELAYED_RELEASE_TABLET | Freq: Two times a day (BID) | ORAL | Status: DC
  Administered 2012-06-23 – 2012-06-25 (×4): 40 mg via ORAL
  Filled 2012-06-23 (×4): qty 1

## 2012-06-23 MED ORDER — POTASSIUM CHLORIDE CRYS ER 20 MEQ PO TBCR
40.0000 meq | EXTENDED_RELEASE_TABLET | Freq: Once | ORAL | Status: AC
  Administered 2012-06-23: 40 meq via ORAL
  Filled 2012-06-23: qty 2

## 2012-06-23 MED ORDER — BUTAMBEN-TETRACAINE-BENZOCAINE 2-2-14 % EX AERO
INHALATION_SPRAY | CUTANEOUS | Status: DC | PRN
  Administered 2012-06-23: 2 via TOPICAL

## 2012-06-23 MED ORDER — FENTANYL CITRATE 0.05 MG/ML IJ SOLN
INTRAMUSCULAR | Status: DC | PRN
  Administered 2012-06-23 (×2): 12.5 ug via INTRAVENOUS

## 2012-06-23 NOTE — Progress Notes (Signed)
Occupational Therapy Evaluation Patient Details Name: [name] MRN: [id number] DOB: 1918-08-11 Today's Date: 06/23/2012 Time: 2952-8413 OT Time Calculation (min): 16 min  OT Assessment / Plan / Recommendation Clinical Impression  78 yo with n/v/diarrhea. PTA, pt lived with family and was alone during the day while daughter worked. Pt with functional decline and will require rehab at SNF prior to return home with family. Pt will benefit from skilled OT acute services to max independence with ADL and functional mobility for ADL to facilitate D/C to SNF.    OT Assessment  Patient needs continued OT Services    Follow Up Recommendations  SNF    Barriers to Discharge Decreased caregiver support daughter works during the day  Equipment Recommendations  None recommended by OT    Recommendations for Other Services    Frequency  Min 2X/week    Precautions / Restrictions Precautions Precautions: Fall Restrictions Weight Bearing Restrictions: No   Pertinent Vitals/Pain No c/o pain    ADL  Grooming: Set up;Supervision/safety Where Assessed - Grooming: Supported sitting Upper Body Bathing: Set up;Supervision/safety Where Assessed - Upper Body Bathing: Supported sitting Lower Body Bathing: Moderate assistance Where Assessed - Lower Body Bathing: Supported sit to stand Upper Body Dressing: Supervision/safety;Set up Where Assessed - Upper Body Dressing: Supported sitting Lower Body Dressing: Moderate assistance Where Assessed - Lower Body Dressing: Supported sit to Pharmacist, hospital: Minimal assistance Toilet Transfer Method: Sit to stand;Stand pivot Acupuncturist: Bedside commode Toileting - Clothing Manipulation and Hygiene: Moderate assistance Where Assessed - Toileting Clothing Manipulation and Hygiene: Sit to stand from 3-in-1 or toilet Equipment Used: Gait belt;Rolling walker Transfers/Ambulation Related to ADLs: Poor standing tolerance ADL Comments: limited  by fatigue    OT Diagnosis: Generalized weakness  OT Problem List: Decreased strength;Decreased activity tolerance;Impaired balance (sitting and/or standing);Decreased safety awareness;Decreased knowledge of use of DME or AE;Decreased knowledge of precautions;Cardiopulmonary status limiting activity OT Treatment Interventions: Self-care/ADL training;Therapeutic exercise;Therapeutic activities;Patient/family education;Balance training   OT Goals Acute Rehab OT Goals OT Goal Formulation: With patient Time For Goal Achievement: 07/07/12 Potential to Achieve Goals: Good ADL Goals Pt Will Perform Grooming: with supervision;Standing at sink ADL Goal: Grooming - Progress: Goal set today Pt Will Perform Lower Body Bathing: with supervision;with set-up;Sit to stand from chair;Supported ADL Goal: Lower Body Bathing - Progress: Goal set today Pt Will Perform Lower Body Dressing: with set-up;with supervision;Sit to stand from chair;Supported ADL Goal: Lower Body Dressing - Progress: Goal set today Pt Will Transfer to Toilet: with supervision;Stand pivot transfer;with DME ADL Goal: Toilet Transfer - Progress: Goal set today Pt Will Perform Toileting - Clothing Manipulation: with modified independence;Sitting on 3-in-1 or toilet;Standing ADL Goal: Toileting - Clothing Manipulation - Progress: Goal set today Pt Will Perform Toileting - Hygiene: with modified independence;Standing at 3-in-1/toilet;Sit to stand from 3-in-1/toilet ADL Goal: Toileting - Hygiene - Progress: Goal set today  Visit Information  Last OT Received On: 06/23/12 Assistance Needed: +1    Subjective Data      Prior Functioning     Home Living Lives With: Family Available Help at Discharge: Available PRN/intermittently (Son and Daughter-in-law work days) Type of Home: House Home Access: Stairs to enter Secretary/administrator of Steps: 3 Entrance Stairs-Rails: Right Home Layout: One level Bathroom Shower/Tub: Tub/shower  unit (sits down in tub; needs assist to get up) Home Adaptive Equipment: Walker - four wheeled Additional Comments: Daughter in law reports that they are worried about pt's safety when she is home alone during the day; She  had recently taken a fall while home alone Prior Function Level of Independence: Needs assistance Needs Assistance: Meal Prep;Light Housekeeping Meal Prep: Maximal Light Housekeeping: Maximal Comments: pt reports she was independent with self care PTA Communication Communication: No difficulties Dominant Hand: Right         Vision/Perception     Cognition  Overall Cognitive Status: History of cognitive impairments - at baseline Arousal/Alertness: Awake/alert Orientation Level: Appears intact for tasks assessed Behavior During Session: [facility] for tasks performed    Extremity/Trunk Assessment Right Upper Extremity Assessment RUE ROM/Strength/Tone: WFL for tasks assessed RUE Sensation: WFL - Light Touch;WFL - Proprioception RUE Coordination: WFL - gross/fine motor Left Upper Extremity Assessment LUE ROM/Strength/Tone: WFL for tasks assessed LUE Sensation: WFL - Light Touch;WFL - Proprioception LUE Coordination: WFL - gross/fine motor Right Lower Extremity Assessment RLE ROM/Strength/Tone: Deficits RLE ROM/Strength/Tone Deficits: Generalized weakness Left Lower Extremity Assessment LLE ROM/Strength/Tone: Deficits LLE ROM/Strength/Tone Deficits: Generalized weakness Trunk Assessment Trunk Assessment: Normal     Mobility Bed Mobility Bed Mobility: Supine to Sit Supine to Sit: 3: Mod assist;With rails;HOB elevated Details for Bed Mobility Assistance: cues to initiate, and to use bedrails to steady self; Mod physical assist to elevate trunk from bed Transfers Transfers: Sit to Stand;Stand to Sit Sit to Stand: 4: Min assist;With upper extremity assist;From chair/3-in-1 Stand to Sit: 4: Min assist;With upper extremity assist;To chair/3-in-1 Details for  Transfer Assistance: unsafe descent to chair     Shoulder Instructions     Exercise     Balance  mod A at times. Fall risk   End of Session OT - End of Session Equipment Utilized During Treatment: Gait belt Activity Tolerance: Patient limited by fatigue Patient left: in chair;with call bell/phone within reach;with nursing in room Nurse Communication: Mobility status  GO     Sharise Lippy,HILLARY 06/23/2012, 4:59 PM [facility], OTR/L  [phone] 06/23/2012

## 2012-06-23 NOTE — Progress Notes (Signed)
[name] 9:56 AM  Subjective: Patient is doing better without new complaints and no obvious further diarrhea  Objective: Vital signs stable afebrile exam okay please see pre-assessment evaluation labs stable  Assessment: Multiple medical problems nausea vomiting guaiac positivity  Plan: Okay to proceed with endoscopy this morning  [name]

## 2012-06-23 NOTE — Progress Notes (Signed)
Utilization review completed.  

## 2012-06-23 NOTE — Progress Notes (Signed)
Physical Therapy Evaluation Patient Details Name: [name] MRN: [id number] DOB: 05-05-1919 Today's Date: 06/23/2012 Time: 3086-5784 PT Time Calculation (min): 32 min  PT Assessment / Plan / Recommendation Clinical Impression  78 yo female admitted with anemia, GIB; presents to PT with decr functional mobility; Will benefit form PT to maximize independence and safety with mobiilty, and to facilitate dc planning    PT Assessment  Patient needs continued PT services    Follow Up Recommendations  SNF    Does the patient have the potential to tolerate intense rehabilitation      Barriers to Discharge        Equipment Recommendations  3 in 1 bedside comode    Recommendations for Other Services     Frequency Min 3X/week    Precautions / Restrictions Precautions Precautions: Fall   Pertinent Vitals/Pain Reports is "miserable"; pain with swallowing      Mobility  Bed Mobility Bed Mobility: Supine to Sit Supine to Sit: 3: Mod assist;With rails;HOB elevated Details for Bed Mobility Assistance: cues to initiate, and to use bedrails to steady self; Mod physical assist to elevate trunk from bed Transfers Transfers: Sit to Stand;Stand to Sit Sit to Stand: 3: Mod assist;From bed;From chair/3-in-1;With armrests;With upper extremity assist Stand to Sit: 4: Min assist;To chair/3-in-1 Details for Transfer Assistance: Light moderate anti-gravity assist with sit to stand, indicative of generalized weakness; somewhat dependent on momentum for successful standing; Stood from bed and from [facility]; cues fro safety and control with stand to sit Ambulation/Gait Ambulation/Gait Assistance: 3: Mod assist Ambulation Distance (Feet): 6 Feet (3to bsc, adn 3 bsc to recliner) Assistive device: Rolling walker Ambulation/Gait Assistance Details: Mod physical assist for RW management; Cues for safety and RW use; Close guard, as pt stating she feels weak    Shoulder Instructions     Exercises     PT  Diagnosis: Difficulty walking;Generalized weakness  PT Problem List: Decreased strength;Decreased activity tolerance;Decreased balance;Decreased mobility;Decreased coordination;Decreased knowledge of use of DME;Decreased cognition PT Treatment Interventions: DME instruction;Gait training;Functional mobility training;Therapeutic activities;Therapeutic exercise;Neuromuscular re-education;Patient/family education   PT Goals Acute Rehab PT Goals PT Goal Formulation: With patient/family Time For Goal Achievement: 07/07/12 Potential to Achieve Goals: Good Pt will go Supine/Side to Sit: with supervision PT Goal: Supine/Side to Sit - Progress: Goal set today Pt will go Sit to Supine/Side: with supervision PT Goal: Sit to Supine/Side - Progress: Goal set today Pt will go Sit to Stand: with supervision PT Goal: Sit to Stand - Progress: Goal set today Pt will go Stand to Sit: with supervision PT Goal: Stand to Sit - Progress: Goal set today Pt will Transfer Bed to Chair/Chair to Bed: with supervision PT Transfer Goal: Bed to Chair/Chair to Bed - Progress: Goal set today Pt will Ambulate: >150 feet;with min assist;with rolling walker PT Goal: Ambulate - Progress: Goal set today  Visit Information  Last PT Received On: 06/23/12 Assistance Needed:  (+1 bedside; +2 helpful for progressive amb)    Subjective Data  Subjective: "I feel lousy"; but pt still agreeable to OOB Patient Stated Goal: Did not state; Family wants what is best for pt   Prior Functioning  Home Living Lives With: Family Available Help at Discharge: Available PRN/intermittently (Son and Daughter-in-law work days) Type of Home: House Home Access: Stairs to enter Secretary/administrator of Steps: 3 Entrance Stairs-Rails: Right Home Layout: One level Bathroom Shower/Tub: Tub/shower unit (sits down in tub; needs assist to get up) Home Adaptive Equipment: Dan Humphreys - four wheeled Additional  Comments: Daughter in law reports that  they are worried about pt's safety when she is home alone during the day; She had recently taken a fall while home alone Prior Function Level of Independence: Needs assistance Needs Assistance: Bathing;Meal Prep;Light Housekeeping Meal Prep: Maximal Light Housekeeping: Maximal Comments: Daughter-in-law reports recent decline Communication Communication: No difficulties    Cognition  Overall Cognitive Status: History of cognitive impairments - at baseline Arousal/Alertness: Awake/alert Orientation Level: Appears intact for tasks assessed Behavior During Session: [name] for tasks performed    Extremity/Trunk Assessment Right Upper Extremity Assessment RUE ROM/Strength/Tone: [facility] for tasks assessed Left Upper Extremity Assessment LUE ROM/Strength/Tone: WFL for tasks assessed Right Lower Extremity Assessment RLE ROM/Strength/Tone: Deficits RLE ROM/Strength/Tone Deficits: Generalized weakness Left Lower Extremity Assessment LLE ROM/Strength/Tone: Deficits LLE ROM/Strength/Tone Deficits: Generalized weakness   Balance    End of Session PT - End of Session Activity Tolerance: Patient tolerated treatment well Patient left: in chair;with call bell/phone within reach;with family/visitor present Nurse Communication: Mobility status  GP     Olen Pel Poydras, [place] [phone]  06/23/2012, 3:33 PM

## 2012-06-23 NOTE — Progress Notes (Signed)
[facility] PROGRESS NOTE  [name] ZOX:[id number] DOB: 1919/04/28 DOA: 06/21/2012 PCP: Tandy Gaw, PA-C  HPI 78 year old female, lives at home with daughter, PMH of HTN, HL, bronchiectasis, home when necessary O2, CAD, GERD, PUD, prior episode of GI bleed, OSA, bioprosthetic aVR admitted on 12/1 with history of generalized weakness, cough, nausea, vomiting without coffee-ground or blood, diarrhea and black tarry stools, dyspnea especially on exertion but no chest pain. Recently treated with doxycycline for COPD exacerbation/pneumonia. In the ED, O2 sats apparently dropped to the 80s, hemoglobin dropped from 10.7 on 11/29 to 8.8 g/dL on 81/19. No acute findings on CT abdomen or chest x-ray.  Assessment/Plan: 1. Likely acute upper GI bleed: FOBT positive. [facility] consulted. EGD 12/2: Moderate distal ulcerative esophagitis. No active bleeding. Bleeding clinically resolved. Continue PPI. Advance diet as tolerated. 2. Acute blood loss anemia on chronic anemia: Baseline hemoglobin probably in the 11-12 g/dL. Secondary to problem #1. Stable. 3. Hypokalemia: replete and follow. 4. Acute on chronic respiratory failure: Likely secondary to anemia complicating underlying pulmonary fibrosis, bronchiectasis and COPD. Continue oxygen and monitor. 5. Nausea, vomiting and diarrhea:? Secondary to problem #1 versus rule out C. difficile versus viral gastroenteritis. No CT findings for colitis. On contact isolation. Followup C. difficile PCR- no further diarrhea- d/c flexiseal and requested nursing to send Cdiff sample if stooling. 6. HTN: controlled.Continue her home medications.  7. CAD: asymptomatic of chest pain. Troponin's negative.  8. COPD/bronchiectasis/pulmonary fibrosis: as above. Stable.  Code Status: DNR  Family Communication: Discussed with [name], patients daughter, at bedside.. Disposition Plan: PT to evaluate- ? SNF. May be ready for d/c  12/3.   Consultants:  [facility]  Procedures:  EGD 12/2 by Dr. Ewing Schlein.  Antibiotics:  None  HPI/Subjective: Denies complaints. No diarrhea.  Objective: Filed Vitals:   06/23/12 1012 06/23/12 1021 06/23/12 1031 06/23/12 1041  BP: 127/54 132/47 110/39 112/47  Pulse:      Temp:      TempSrc:      Resp: 20 20 20 20   Height:      Weight:      SpO2: 99% 99% 99% 100%    Intake/Output Summary (Last 24 hours) at 06/23/12 1349 Last data filed at 06/23/12 1036  Gross per 24 hour  Intake     75 ml  Output    300 ml  Net   -225 ml   Filed Weights   06/21/12 1558 06/22/12 0017 06/22/12 2041  Weight: 63.957 kg (141 lb) 66.4 kg (146 lb 6.2 oz) 66.401 kg (146 lb 6.2 oz)    Exam:   General exam: Small built and thinly nourished female, comfortable.  Respiratory system: Basal course, chronic sounding crackles. Rest of lung fields clear to auscultation. No increased work of breathing.  Cardiovascular system: First and second heart sounds heard, regular rate and rhythm. No JVD, murmurs or gallops. Telemetry shows sinus rhythm and first degree AV block.  Gastrointestinal system: Abdomen is nondistended, soft and mild tenderness in right lower quadrant. No rigidity guarding or rebound. Normal bowel sounds heard. No organomegaly or masses appreciated. Patient has a flexiseal with small amount of liquid black stools.  CNS: Alert. No focal neurological deficits. Slightly confused- ? Sedation effect- just returned from EGD  Extremities: Symmetric 5 x 5 power.   Data Reviewed: Basic Metabolic Panel:  Lab 06/23/12 1478 06/22/12 0640 06/21/12 1615 06/20/12 1118  NA 139 137 132* 136  K 3.3* 3.6 3.5 3.7  CL 104 107 99 97  CO2 25 23 22 26   GLUCOSE 96 93 110* 131*  BUN 11 12 18  28*  CREATININE 0.99 0.98 1.00 1.10  CALCIUM 8.8 8.5 8.6 9.2  MG -- -- -- --  PHOS -- -- -- --   Liver Function Tests:  Lab 06/20/12 1118  AST 22  ALT 13  ALKPHOS 102  BILITOT 0.2*   PROT 7.3  ALBUMIN 3.0*    Lab 06/20/12 1118  LIPASE 25  AMYLASE --   No results found for this basename: AMMONIA:5 in the last 168 hours CBC:  Lab 06/23/12 1220 06/23/12 0700 06/22/12 2219 06/22/12 1554 06/22/12 1115 06/21/12 1615 06/20/12 1118  WBC 9.8 7.9 11.1* 10.5 9.3 -- --  NEUTROABS -- -- -- -- -- 11.2* 10.9*  HGB 8.4* 8.3* 8.4* 8.5* 7.9* -- --  HCT 25.6* 24.2* 24.9* 25.6* 24.0* -- --  MCV 83.9 83.7 83.0 83.1 83.3 -- --  PLT 378 334 352 320 312 -- --   Cardiac Enzymes:  Lab 06/22/12 1554 06/22/12 0920 06/22/12 0640 06/21/12 1615  CKTOTAL -- -- -- --  CKMB -- -- -- --  CKMBINDEX -- -- -- --  TROPONINI <0.30 <0.30 <0.30 <0.30   BNP (last 3 results)  Basename 11/21/11 1040  PROBNP 140.0*   CBG:  Lab 06/23/12 0813 06/22/12 2055  GLUCAP 89 95    No results found for this or any previous visit (from the past 240 hour(s)).   Studies: Dg Ribs Unilateral W/chest Right  06/21/2012  *RADIOLOGY REPORT*  Clinical Data: Right-sided chest pain.  Shortness of breath. Generalized weakness.  RIGHT RIBS AND CHEST - 3+ VIEW  Comparison: Two-view chest x-ray 05/14/2012, 11/21/2011.  No prior rib imaging.  Findings: No fractures identified involving the right ribs. Osteopenia.  Costal cartilage calcification.  Cardiac silhouette upper normal in size to slightly enlarged but stable.  Thoracic aorta atherosclerotic, unchanged.  Hilar and mediastinal contours otherwise unremarkable.  Changes of pulmonary fibrosis in both lungs, right greater than left, with pleuroparenchymal scarring at both lung bases and parenchymal scarring in the right upper lobe, unchanged.  No new pulmonary parenchymal abnormalities.  IMPRESSION:  1.  No right rib fractures identified. 2.  No acute cardiopulmonary disease.  Stable changes of pulmonary fibrosis, pleuroparenchymal scarring at the lung bases, and parenchymal scarring in the right upper lobe.   Original Report Authenticated By: Hulan Saas, M.D.     Ct Abdomen Pelvis W Contrast  06/21/2012  *RADIOLOGY REPORT*  Clinical Data: .  Abdominal pain and diarrhea.  CT ABDOMEN AND PELVIS WITH CONTRAST  Technique:  Multidetector CT imaging of the abdomen and pelvis was performed following the standard protocol during bolus administration of intravenous contrast.  Contrast: OMNIPAQUE IOHEXOL 300 MG/ML  SOLN  Comparison: None.  Findings: Pulmonary scarring in both lung bases.  Small right pleural effusion.  Liver and bile ducts are normal.  Gallbladder has been removed. The pancreas is atrophic.  The spleen is normal.  Kidneys show no obstruction or mass.  2.5 cm right renal cyst.  No renal calculi. There is liquid stool in the colon.  Small bowel is not dilated. No free fluid in the abdomen.  No mass or adenopathy.  No acute bony abnormality.  IMPRESSION: Bibasilar lung scarring.  Small right pleural effusion.  Liquid stool in the colon may represent gastroenteritis.  No bowel obstruction.  Sigmoid diverticulosis without diverticulitis.   Original Report Authenticated By: Janeece Riggers, M.D.    EGD  FINDINGS:1. Moderate hiatal hernia  2. Moderate distal ulcerative esophagitis status post biopsy 3 . Otherwise within normal limits to the third part of the duodenum without any other signs of bleeding  Scheduled Meds:    . amitriptyline  50 mg Oral QHS  . amLODipine  5 mg Oral Daily  . cloNIDine  0.1 mg Transdermal Weekly  . feeding supplement  1 Container Oral TID BM  . furosemide  40 mg Oral Daily  . irbesartan  300 mg Oral Daily  . metoprolol succinate  100 mg Oral Daily  . pantoprazole (PROTONIX) IV  40 mg Intravenous Q12H  . prednisoLONE acetate  1 drop Both Eyes Daily  . sodium chloride  3 mL Intravenous Q12H  . topiramate  25 mg Oral QHS  . [DISCONTINUED] sodium chloride  3 mL Intravenous Q12H   Continuous Infusions:    . [DISCONTINUED] sodium chloride      Principal Problem:  *Nausea vomiting and diarrhea Active Problems:   Hypertension  Hyperlipidemia  Bronchiectasis  GI bleed  Anemia  Acute blood loss anemia  Respiratory failure, acute-on-chronic    Time spent: 45 minutes    [facility]  [facility] Pager [phone]. If 8PM-8AM, please contact night-coverage at [email or web address], password [facility] 06/23/2012, 1:49 PM  LOS: 2 days

## 2012-06-23 NOTE — Op Note (Signed)
[facility] [address] [place], [address]   ENDOSCOPY PROCEDURE REPORT  PATIENT: [name], [name]  MR#: [id number] BIRTHDATE: 11/25/1918 , 89  yrs. old GENDER: Female  ENDOSCOPIST: Vida Rigger, MD REFERRED BY:  PROCEDURE DATE:  06/23/2012 PROCEDURE:   EGD w/ biopsy ASA CLASS:   Class II INDICATIONS:Anemia, Occult blood positive, Nausea, and Vomiting.  MEDICATIONS: Fentanyl 25 mcg IV and Versed 2 mg IV  TOPICAL ANESTHETIC:used DESCRIPTION OF PROCEDURE:   After the risks benefits and alternatives of the procedure were thoroughly explained, informed consent was obtained.  The Pentax Gastroscope [id number]  endoscope was introduced through the mouth and advanced to the third portion of the duodenum , limited by Without limitations.   The instrument was slowly withdrawn as the mucosa was fully examined.the findings are recorded below and other than some distal ulcerative esophagitis which was biopsied and a medium-sized hiatal hernia there was no other endoscopic findings and the patient tolerated the procedure well there was no obvious immediate complication         FINDINGS:1. Moderate hiatal hernia 2. Moderate distal ulcerative esophagitis status post biopsy 3 . Otherwise within normal limits to the third part of the duodenum without any other signs of bleeding COMPLICATIONS:none  ENDOSCOPIC IMPRESSION:above   RECOMMENDATIONS:continue pump inhibitor follow clinically and decide on further workup and plans based on how she does   REPEAT EXAM: as needed   _______________________________ Vida Rigger, MD eSigned:  Vida Rigger, MD 06/23/2012 10:19 AM    CC:  PATIENT NAME:  Desha, Bolding MR#: [id number]

## 2012-06-23 NOTE — Progress Notes (Signed)
INITIAL ADULT NUTRITION ASSESSMENT Date: 06/23/2012   Time: 11:47 AM  Reason for Assessment: Malnutrition Screening  INTERVENTION: 1. Resource Breeze po TID, each supplement provides 250 kcal and 9 grams of protein. 2. RD to continue to follow nutrition care plan  DOCUMENTATION CODES Per approved criteria  -Not Applicable   ASSESSMENT: Female 78 y.o.  Dx: Nausea vomiting and diarrhea  Hx:  Past Medical History  Diagnosis Date  . Hypertension   . Hyperlipidemia   . OSA (obstructive sleep apnea)     not on CPAP  . Bronchiectasis   . Aortic stenosis   . CAD (coronary artery disease)     Present PCI and MI  . GERD (gastroesophageal reflux disease)   . PUD (peptic ulcer disease)   . Legally blind   . Scleroderma    Past Surgical History  Procedure Date  . Cardiac valve replacement 2011  . Coronary artery bypass graft 2011  . Left shoulder surgery   . Corneal transplant   . Cholecystectomy   . Appendectomy   . Abdominal hysterectomy   . Total knee arthroplasty    Related Meds:     . amitriptyline  50 mg Oral QHS  . amLODipine  5 mg Oral Daily  . cloNIDine  0.1 mg Transdermal Weekly  . furosemide  40 mg Oral Daily  . irbesartan  300 mg Oral Daily  . metoprolol succinate  100 mg Oral Daily  . pantoprazole (PROTONIX) IV  40 mg Intravenous Q12H  . prednisoLONE acetate  1 drop Both Eyes Daily  . sodium chloride  3 mL Intravenous Q12H  . topiramate  25 mg Oral QHS  . [DISCONTINUED] sodium chloride  3 mL Intravenous Q12H   Ht: 5' (152.4 cm)  Wt: 146 lb 6.2 oz (66.401 kg)  Ideal Wt: 100 lb/45.5 kg % Ideal Wt: 146%  Wt Readings from Last 15 Encounters:  06/22/12 146 lb 6.2 oz (66.401 kg)  06/22/12 146 lb 6.2 oz (66.401 kg)  06/14/12 141 lb (63.957 kg)  05/14/12 145 lb (65.772 kg)  04/30/12 149 lb (67.586 kg)  04/09/12 146 lb (66.225 kg)  03/26/12 150 lb (68.04 kg)  03/26/12 150 lb (68.04 kg)  02/14/12 153 lb (69.4 kg)  02/13/12 151 lb (68.493 kg)    01/03/12 153 lb 3.2 oz (69.491 kg)  12/12/11 153 lb (69.4 kg)  11/21/11 152 lb (68.947 kg)  11/21/11 154 lb 6.4 oz (70.035 kg)  09/23/11 154 lb (69.854 kg)  Usual Wt: 145 - 150 lb % Usual Wt: 100%  Body mass index is 28.59 kg/(m^2). Overweight  Labs:  CMP     Component Value Date/Time   NA 139 06/23/2012 0700   K 3.3* 06/23/2012 0700   CL 104 06/23/2012 0700   CO2 25 06/23/2012 0700   GLUCOSE 96 06/23/2012 0700   BUN 11 06/23/2012 0700   CREATININE 0.99 06/23/2012 0700   CREATININE 1.21* 11/21/2011 1509   CALCIUM 8.8 06/23/2012 0700   PROT 7.3 06/20/2012 1118   ALBUMIN 3.0* 06/20/2012 1118   AST 22 06/20/2012 1118   ALT 13 06/20/2012 1118   ALKPHOS 102 06/20/2012 1118   BILITOT 0.2* 06/20/2012 1118   GFRNONAA 48* 06/23/2012 0700   GFRAA 56* 06/23/2012 0700   No results found for this basename: phos   No results found for this basename: mg    Intake/Output Summary (Last 24 hours) at 06/23/12 1149 Last data filed at 06/23/12 1036  Gross per 24 hour  Intake  75 ml  Output    500 ml  Net   -425 ml  BM: 12/2 (diarrhea)  Diet Order: Clear Liquid  Supplements/Tube Feeding: none  IVF:    [DISCONTINUED] sodium chloride   Estimated Nutritional Needs:   Kcal: 1200 - 1400 kcal Protein:  70 - 80 grams Fluid: 1.5 - 1.8 liters daily  Admitted n/v/d. Endoscopy completed 12/2 with findings of moderate hiatal hernia and ulcerative esophagitis that was biopsied. Pt reports that she is tolerating liquids well and was eating fairly well prior to onset of symptoms at home. States that she thinks she has lost weight since she has been admitted, pt with 5 lb weight gain since admission.  Pt is at nutrition risk given advanced age and current acute medical issues. Pt would benefit from oral nutrition supplements to help meet nutritional needs while advancing diet.  NUTRITION DIAGNOSIS: Inadequate oral intake r/t GI distress AEB recent diminished PO  intake.  MONITORING/EVALUATION(Goals): Goal: Diet advancement to meet >/= 90% of their estimated nutrition needs. Monitor: weight trends, lab trends, I/O's, PO intake, supplement tolerance, diet advancement  EDUCATION NEEDS: -No education needs identified at this time  Jarold Motto MS, RD, LDN Pager: [phone] After-hours pager: [phone]

## 2012-06-24 ENCOUNTER — Encounter (HOSPITAL_COMMUNITY): Payer: Self-pay | Admitting: Gastroenterology

## 2012-06-24 LAB — CLOSTRIDIUM DIFFICILE BY PCR: Toxigenic C. Difficile by PCR: NEGATIVE

## 2012-06-24 LAB — BASIC METABOLIC PANEL
CO2: 26 mEq/L (ref 19–32)
Chloride: 104 mEq/L (ref 96–112)
Glucose, Bld: 107 mg/dL — ABNORMAL HIGH (ref 70–99)
Potassium: 4 mEq/L (ref 3.5–5.1)
Sodium: 137 mEq/L (ref 135–145)

## 2012-06-24 LAB — CBC
HCT: 25.8 % — ABNORMAL LOW (ref 36.0–46.0)
Hemoglobin: 7.8 g/dL — ABNORMAL LOW (ref 12.0–15.0)
Hemoglobin: 8.4 g/dL — ABNORMAL LOW (ref 12.0–15.0)
MCV: 84.6 fL (ref 78.0–100.0)
Platelets: 356 10*3/uL (ref 150–400)
RBC: 2.77 MIL/uL — ABNORMAL LOW (ref 3.87–5.11)
RBC: 3.05 MIL/uL — ABNORMAL LOW (ref 3.87–5.11)
RDW: 15.2 % (ref 11.5–15.5)
WBC: 8.1 10*3/uL (ref 4.0–10.5)
WBC: 9 10*3/uL (ref 4.0–10.5)

## 2012-06-24 LAB — GLUCOSE, CAPILLARY: Glucose-Capillary: 104 mg/dL — ABNORMAL HIGH (ref 70–99)

## 2012-06-24 MED ORDER — SUCRALFATE 1 GM/10ML PO SUSP
1.0000 g | Freq: Three times a day (TID) | ORAL | Status: DC
  Administered 2012-06-24 – 2012-06-25 (×5): 1 g via ORAL
  Filled 2012-06-24 (×8): qty 10

## 2012-06-24 MED ORDER — GUAIFENESIN-DM 100-10 MG/5ML PO SYRP
5.0000 mL | ORAL_SOLUTION | ORAL | Status: DC | PRN
  Administered 2012-06-24: 5 mL via ORAL
  Filled 2012-06-24 (×2): qty 5

## 2012-06-24 NOTE — Progress Notes (Signed)
Physical Therapy Treatment Patient Details Name: [name] MRN: [id number] DOB: 05/19/1919 Today's Date: 06/24/2012 Time: 4098-1191 PT Time Calculation (min): 25 min  PT Assessment / Plan / Recommendation Comments on Treatment Session  Admitted with N/V; Session conducted on Room Air as pt states at times she does not use her supplemental O2; Once back in room, and sitting on BSC, pt did state she felt lightheaded, so assisted her back to bed; continue to agree with dc to SNF    Follow Up Recommendations  SNF     Does the patient have the potential to tolerate intense rehabilitation     Barriers to Discharge        Equipment Recommendations  None recommended by PT    Recommendations for Other Services    Frequency Min 3X/week   Plan Discharge plan remains appropriate    Precautions / Restrictions Precautions Precautions: Fall Restrictions Other Position/Activity Restrictions: Desatted today with activity on room Air   Pertinent Vitals/Pain Session conducted on room air; O2 sats dropped to 83% observed lowest; Restarted O2 via [place] 2 L and sats incr to 93-94%  Pt reported feeling "like going to pass out" after amb, so assisted her back to bed, did not have time to obtain a BP    Mobility  Bed Mobility Bed Mobility: Supine to Sit;Sit to Supine Supine to Sit: 4: Min assist;With rails;HOB elevated Sit to Supine: 4: Min assist;With rail Details for Bed Mobility Assistance: Cues for safety and technique; Min assist to help LEs back into bed Transfers Transfers: Sit to Stand;Stand to Sit Sit to Stand: 4: Min assist;With upper extremity assist;From chair/3-in-1 Stand to Sit: 4: Min assist;With upper extremity assist;To chair/3-in-1 Details for Transfer Assistance: Cues for safety, ahnd placement; unsafe descent to 3in 1 after amb Ambulation/Gait Ambulation/Gait Assistance: 4: Min assist Ambulation Distance (Feet): 65 Feet Assistive device: Rolling walker Ambulation/Gait  Assistance Details: Cues for safety, posture, RW management, and to self-monitor for activity tol Gait Pattern: Decreased stride length    Exercises     PT Diagnosis:    PT Problem List:   PT Treatment Interventions:     PT Goals Acute Rehab PT Goals Time For Goal Achievement: 07/07/12 Potential to Achieve Goals: Good Pt will go Supine/Side to Sit: with supervision PT Goal: Supine/Side to Sit - Progress: Progressing toward goal Pt will go Sit to Supine/Side: with supervision PT Goal: Sit to Supine/Side - Progress: Progressing toward goal Pt will go Sit to Stand: with supervision PT Goal: Sit to Stand - Progress: Progressing toward goal Pt will go Stand to Sit: with supervision PT Goal: Stand to Sit - Progress: Progressing toward goal Pt will Ambulate: >150 feet;with min assist;with rolling walker PT Goal: Ambulate - Progress: Progressing toward goal  Visit Information  Last PT Received On: 06/24/12 Assistance Needed: +1    Subjective Data  Subjective: "terrible" when asked how she feels; but stil lagreeable to amb Patient Stated Goal: Did not state; Family wants what is best for pt   Cognition  Overall Cognitive Status: History of cognitive impairments - at baseline Arousal/Alertness: Awake/alert Orientation Level: Appears intact for tasks assessed Behavior During Session: [facility] for tasks performed    Balance     End of Session PT - End of Session Equipment Utilized During Treatment: Gait belt Activity Tolerance: Patient tolerated treatment well Patient left: in bed;with call bell/phone within reach Nurse Communication: Mobility status   GP     Van Clines [facility]  06/24/2012, 4:42  PM   

## 2012-06-24 NOTE — Progress Notes (Addendum)
[facility] PROGRESS NOTE  [name] QIO:[id number] DOB: 13-Apr-1919 DOA: 06/21/2012 PCP: Karen Gaw, PA-C  HPI 78 year old female, lives at home with daughter, PMH of HTN, HL, bronchiectasis, home when necessary O2, CAD, GERD, PUD, prior episode of GI bleed, OSA, bioprosthetic aVR admitted on 12/1 with history of generalized weakness, cough, nausea, vomiting without coffee-ground or blood, diarrhea and black tarry stools, dyspnea especially on exertion but no chest pain. Recently treated with doxycycline for COPD exacerbation/pneumonia. In the ED, O2 sats apparently dropped to the 80s, hemoglobin dropped from 10.7 on 11/29 to 8.8 g/dL on 32/44. No acute findings on CT abdomen or chest x-ray.  Assessment/Plan: 1. Likely acute upper GI bleed: [facility] gastroenterology consulted. EGD 12/2: Moderate distal ulcerative esophagitis. No active bleeding. Bleeding clinically resolved. Continue PPI. Will add Carafate slurry as per GI recommendations. Tolerated 50% of heart healthy diet for breakfast. 2. Acute blood loss anemia on chronic anemia: Baseline hemoglobin probably in the 11-12 g/dL. Secondary to problem #1. Hemoglobin stable, vital signs stable, asymptomatic of dizziness/lightheadedness/chest pain or dyspnea. Would hold off on transfusions at this time and patient refuses transfusion anyway. However if there is any further drop or patient becomes symptomatic then we'll consider PRBC transfusion. 3. Hypokalemia: repleted 4. Acute on chronic respiratory failure: Likely secondary to anemia complicating underlying pulmonary fibrosis, bronchiectasis and COPD. Continue oxygen and monitor. Will evaluate for home oxygen. ARF likely resolved. 5. Nausea, vomiting and diarrhea:? Secondary to problem #1 versus rule out C. difficile versus viral gastroenteritis. No CT findings for colitis. Resolved. C. difficile PCR negative. Discontinue contact isolation.  6. HTN: controlled.Continue her home  medications.  7. CAD: asymptomatic of chest pain. Troponin's negative.  8. COPD/bronchiectasis/pulmonary fibrosis: as above. Stable.  Code Status: DNR  Family Communication: Discussed with [name], patients daughter, at bedside on 11/2. Disposition Plan: Medically stable for discharge to SNF when bed available.   Consultants:  [name]  Procedures:  EGD 12/2 by Dr. Ewing Garcia.  Antibiotics:  None  HPI/Subjective: Denies complaints. No diarrhea, abdominal pain, dizziness, lightheadedness, chest pain or dyspnea. Tolerated 50% of heart healthy breakfast. Had a BM on 12/2-no details available.  Objective: Filed Vitals:   06/23/12 1700 06/23/12 2052 06/24/12 0621 06/24/12 0935  BP: 111/32 108/48 136/49 127/79  Pulse: 56 66 64 81  Temp:  97.2 F (36.2 C) 98.1 F (36.7 C) 98.2 F (36.8 C)  TempSrc:  Oral Oral Oral  Resp:  18 18 18   Height:      Weight:  66.401 kg (146 lb 6.2 oz)    SpO2:  98% 100% 100%    Intake/Output Summary (Last 24 hours) at 06/24/12 1058 Last data filed at 06/24/12 0900  Gross per 24 hour  Intake    600 ml  Output    375 ml  Net    225 ml   Filed Weights   06/22/12 0017 06/22/12 2041 06/23/12 2052  Weight: 66.4 kg (146 lb 6.2 oz) 66.401 kg (146 lb 6.2 oz) 66.401 kg (146 lb 6.2 oz)    Exam:   General exam: Small built and thinly nourished female, comfortable- sitting on chair .  Respiratory system: Basal course, chronic sounding crackles. Rest of lung fields clear to auscultation. No increased work of breathing.  Cardiovascular system: First and second heart sounds heard, regular rate and rhythm. No JVD, murmurs or gallops. Telemetry shows sinus rhythm and first degree AV block.  Gastrointestinal system: Abdomen is nondistended, soft and mild tenderness in right  lower quadrant. No rigidity guarding or rebound. Normal bowel sounds heard. No organomegaly or masses appreciated.   CNS: Alert and oriented. No focal neurological  deficits.   Extremities: Symmetric 5 x 5 power.   Data Reviewed: Basic Metabolic Panel:  Lab 06/24/12 1610 06/23/12 0700 06/22/12 0640 06/21/12 1615 06/20/12 1118  NA 137 139 137 132* 136  K 4.0 3.3* 3.6 3.5 3.7  CL 104 104 107 99 97  CO2 26 25 23 22 26   GLUCOSE 107* 96 93 110* 131*  BUN 14 11 12 18  28*  CREATININE 1.13* 0.99 0.98 1.00 1.10  CALCIUM 8.6 8.8 8.5 8.6 9.2  MG -- -- -- -- --  PHOS -- -- -- -- --   Liver Function Tests:  Lab 06/20/12 1118  AST 22  ALT 13  ALKPHOS 102  BILITOT 0.2*  PROT 7.3  ALBUMIN 3.0*    Lab 06/20/12 1118  LIPASE 25  AMYLASE --   No results found for this basename: AMMONIA:5 in the last 168 hours CBC:  Lab 06/24/12 0940 06/24/12 0655 06/23/12 1220 06/23/12 0700 06/22/12 2219 06/21/12 1615 06/20/12 1118  WBC 9.0 8.1 9.8 7.9 11.1* -- --  NEUTROABS -- -- -- -- -- 11.2* 10.9*  HGB 8.4* 7.8* 8.4* 8.3* 8.4* -- --  HCT 25.8* 23.4* 25.6* 24.2* 24.9* -- --  MCV 84.6 84.5 83.9 83.7 83.0 -- --  PLT 389 356 378 334 352 -- --   Cardiac Enzymes:  Lab 06/22/12 1554 06/22/12 0920 06/22/12 0640 06/21/12 1615  CKTOTAL -- -- -- --  CKMB -- -- -- --  CKMBINDEX -- -- -- --  TROPONINI <0.30 <0.30 <0.30 <0.30   BNP (last 3 results)  Basename 11/21/11 1040  PROBNP 140.0*   CBG:  Lab 06/24/12 0836 06/23/12 0813 06/22/12 2055  GLUCAP 104* 89 95    Recent Results (from the past 240 hour(s))  CLOSTRIDIUM DIFFICILE BY PCR     Status: Normal   Collection Time   06/23/12  6:33 PM      Component Value Range Status Comment   C difficile by pcr NEGATIVE  NEGATIVE Final      Studies: No results found. EGD  FINDINGS:1. Moderate hiatal hernia 2. Moderate distal ulcerative esophagitis status post biopsy 3 . Otherwise within normal limits to the third part of the duodenum without any other signs of bleeding  Scheduled Meds:    . amitriptyline  50 mg Oral QHS  . amLODipine  5 mg Oral Daily  . cloNIDine  0.1 mg Transdermal Weekly  . feeding  supplement  1 Container Oral TID BM  . furosemide  40 mg Oral Daily  . irbesartan  300 mg Oral Daily  . metoprolol succinate  100 mg Oral Daily  . pantoprazole  40 mg Oral BID AC  . [COMPLETED] potassium chloride  40 mEq Oral Once  . prednisoLONE acetate  1 drop Both Eyes Daily  . sodium chloride  3 mL Intravenous Q12H  . topiramate  25 mg Oral QHS  . [DISCONTINUED] pantoprazole (PROTONIX) IV  40 mg Intravenous Q12H  . [DISCONTINUED] sodium chloride  3 mL Intravenous Q12H   Continuous Infusions:    Principal Problem:  *Nausea vomiting and diarrhea Active Problems:  Hypertension  Hyperlipidemia  Bronchiectasis  GI bleed  Anemia  Acute blood loss anemia  Respiratory failure, acute-on-chronic  Hypokalemia    Time spent: 45 minutes    [facility]  [facility] Pager [phone]. If 8PM-8AM, please contact night-coverage at  [email or web address], password [facility] 06/24/2012, 10:58 AM  LOS: 3 days

## 2012-06-24 NOTE — Progress Notes (Signed)
[name] 10:13 AM  Subjective: Patient says she has some burning with swallowing which he had prior to her endoscopy but her diarrhea is better and she is not having abdominal pain and was on AcipHex at home and had been on Nexium prior and no new complaint and she seems to be tolerating clear liquids Objective: Vital signs stable afebrile no acute distress abdomen is soft nontender hemoglobin slight decrease other labs stable  Assessment: Hiatal hernia with ulcerative esophagitis  Plan: Consider adding Carafate slurry and probably one more day of clear liquids and she probably would benefit from a transfusion and we'll check on tomorrow and wait on biopsy  [facility] E

## 2012-06-25 LAB — CBC
HCT: 23.3 % — ABNORMAL LOW (ref 36.0–46.0)
Hemoglobin: 7.9 g/dL — ABNORMAL LOW (ref 12.0–15.0)
MCH: 28.6 pg (ref 26.0–34.0)
MCV: 84.4 fL (ref 78.0–100.0)
RBC: 2.76 MIL/uL — ABNORMAL LOW (ref 3.87–5.11)

## 2012-06-25 LAB — GLUCOSE, CAPILLARY: Glucose-Capillary: 94 mg/dL (ref 70–99)

## 2012-06-25 MED ORDER — TRAMADOL HCL 50 MG PO TABS: 50.0000 mg | ORAL_TABLET | Freq: Three times a day (TID) | ORAL | Status: DC | PRN

## 2012-06-25 MED ORDER — BOOST / RESOURCE BREEZE PO LIQD: 1.0000 | Freq: Three times a day (TID) | ORAL | Status: DC

## 2012-06-25 MED ORDER — PANTOPRAZOLE SODIUM 40 MG PO TBEC: 40.0000 mg | DELAYED_RELEASE_TABLET | Freq: Two times a day (BID) | ORAL | Status: DC

## 2012-06-25 MED ORDER — CLONIDINE HCL 0.1 MG/24HR TD PTWK: 1.0000 | MEDICATED_PATCH | TRANSDERMAL | Status: DC

## 2012-06-25 MED ORDER — SUCRALFATE 1 GM/10ML PO SUSP: 1.0000 g | Freq: Three times a day (TID) | ORAL | Status: DC

## 2012-06-25 NOTE — Progress Notes (Signed)
[name] 11:04 AM  Subjective: Patient continues to have some discomfort with swallowing but no nausea or vomiting or abdominal pain and no signs of bleeding and her lower bowels are better Objective: Vital signs stable afebrile no acute distress abdomen is soft nontender hemoglobin stable biopsies still pending  Assessment: Hiatal hernia with ulcerative esophagitis  Plan: Await pathology twice a day pump inhibitors since supposedly she was on AcipHex once a day at home and continue Carafate since it seems to be helping but can stop when better and I am happy to see back when necessary  [facility] E

## 2012-06-25 NOTE — Progress Notes (Signed)
Physical Therapy Treatment Patient Details Name: [name] MRN: [id number] DOB: 05-26-1919 Today's Date: 06/25/2012 Time: 7829-5621 PT Time Calculation (min): 27 min  PT Assessment / Plan / Recommendation Comments on Treatment Session  Agreeable to amb today, but pt generally limited by fatigue; as today is dc day, decided not to push pt; Ambulated on 2 L O2 with no apparent distress, and pt did not report the same feeling like she's going to "pass out" like she did yesterday; Towards the end of session, pt began to talk about her age, her life, her values of working hard    Follow Up Recommendations  SNF     Does the patient have the potential to tolerate intense rehabilitation     Barriers to Discharge        Equipment Recommendations  None recommended by PT    Recommendations for Other Services    Frequency Min 3X/week   Plan Discharge plan remains appropriate    Precautions / Restrictions Precautions Precautions: Fall   Pertinent Vitals/Pain no apparent distress Session conducted on 2  L O2    Mobility  Bed Mobility Bed Mobility: Supine to Sit;Sit to Supine Supine to Sit: 4: Min assist;With rails;HOB elevated Sit to Supine: 4: Min assist;With rail Details for Bed Mobility Assistance: Cues for safety and technique; Min assist to help LEs back into bed Transfers Transfers: Sit to Stand;Stand to Sit Sit to Stand: 4: Min assist;With upper extremity assist;From chair/3-in-1 Stand to Sit: 4: Min assist;With upper extremity assist;To chair/3-in-1 Details for Transfer Assistance: Continued need for cues for hand placement; Tending to pull up on RW Ambulation/Gait Ambulation/Gait Assistance: 4: Min assist Ambulation Distance (Feet): 20 Feet Assistive device: Rolling walker Ambulation/Gait Assistance Details: cues fro posture and self-monitor for activity tolerance; Ambulated on 2L O2 as pt desatted with amb on Room Air yesterday; Pt opting not to go as far today Gait  Pattern: Decreased stride length;Trunk flexed    Exercises     PT Diagnosis:    PT Problem List:   PT Treatment Interventions:     PT Goals Acute Rehab PT Goals Time For Goal Achievement: 07/07/12 Potential to Achieve Goals: Good Pt will go Supine/Side to Sit: with supervision PT Goal: Supine/Side to Sit - Progress: Progressing toward goal Pt will go Sit to Supine/Side: with supervision PT Goal: Sit to Supine/Side - Progress: Progressing toward goal Pt will go Sit to Stand: with supervision PT Goal: Sit to Stand - Progress: Progressing toward goal Pt will go Stand to Sit: with supervision PT Goal: Stand to Sit - Progress: Progressing toward goal Pt will Ambulate: >150 feet;with min assist;with rolling walker PT Goal: Ambulate - Progress: Not progressing  Visit Information  Last PT Received On: 06/25/12 Assistance Needed: +1    Subjective Data  Subjective: Agreeable to amb; "They are taking me to [facility] or someplace like that" (referring to [facility]) Patient Stated Goal: Did not state; Family wants what is best for pt   Cognition  Overall Cognitive Status: History of cognitive impairments - at baseline Arousal/Alertness: Awake/alert Orientation Level: Appears intact for tasks assessed Behavior During Session: [facility] for tasks performed    Balance     End of Session PT - End of Session Equipment Utilized During Treatment: Gait belt Activity Tolerance: Patient tolerated treatment well Patient left: in bed;with call bell/phone within reach   GP     Van Clines [facility] [place], [place] [phone]  06/25/2012, 4:00 PM

## 2012-06-25 NOTE — Clinical Social Work Note (Addendum)
Patient medically stable for discharge to [facility] today. Discharge information forwarded to facility and patient's medical packet prepared. Patient's daughter completed admissions paperwork at facility. CSW facilitated transport via ambulance.  CSW received a call from patient's daughter regarding ambulance transport status. She was advised that patient had just been picked up by [facility] and was in route.  Genelle Bal, MSW, LCSW [phone]

## 2012-06-25 NOTE — Discharge Summary (Signed)
Physician Discharge Summary  [name] JXB:[id number] DOB: [birth year] DOA: 06/21/2012  PCP: Tandy Gaw, PA-C  Admit date: 06/21/2012 Discharge date: 06/25/2012  Time spent: 40 minutes   Discharge Diagnoses:   GI bleed due to esophagitis  Acute blood loss anemia Nausea vomiting and diarrhea  Hypertension  Hyperlipidemia  Bronchiectasis  Respiratory failure, acute-on-chronic  Hypokalemia   Discharge Condition: fair , patient was refered to snf  Diet recommendation: heart healthy   Filed Weights   06/22/12 2041 06/23/12 2052 06/24/12 2100  Weight: 66.401 kg (146 lb 6.2 oz) 66.401 kg (146 lb 6.2 oz) 64 kg (141 lb 1.5 oz)    History of present illness:  78 year old female, lives at home with daughter, PMH of HTN, HL, bronchiectasis, home when necessary O2, CAD, GERD, PUD, prior episode of GI bleed, OSA, bioprosthetic aVR admitted on 12/1 with history of generalized weakness, cough, nausea, vomiting without coffee-ground or blood, diarrhea and black tarry stools, dyspnea especially on exertion but no chest pain. Recently treated with doxycycline for COPD exacerbation/pneumonia. In the ED, O2 sats apparently dropped to the 80s, hemoglobin dropped from 10.7 on 11/29 to 8.8 g/dL on 13/08. No acute findings on CT abdomen or chest x-ray.   Hospital Course:  1. Likely acute upper GI bleed: [facility] gastroenterology consulted. EGD 12/2: Moderate distal ulcerative esophagitis. No active bleeding. Bleeding clinically resolved. Continue PPI, but will increase to BId at DC. added Carafate TID as per GI recommendations. T 2 Acute blood loss anemia on chronic anemia: Baseline hemoglobin probably in the 11-12 g/dL. Secondary to problem #1. Hemoglobin stable, vital signs stable, asymptomatic of dizziness/lightheadedness/chest pain or dyspnea. Would hold off on transfusions at this time and patient refuses transfusion anyway. 3. Hypokalemia: repleted  4. Acute on chronic respiratory failure: Likely  secondary to anemia complicating underlying pulmonary fibrosis, bronchiectasis and COPD. Continue oxygen and monitor.  5. ARF likely due to dehydration from Nausea, vomiting and diarrhea 6. Diarrhea  Secondary to problem #1 . No CT findings for colitis. Resolved. C. difficile PCR negative.   Procedures: EGD   Consultations:  [facility]  Discharge Exam: Filed Vitals:   06/24/12 1800 06/24/12 2100 06/25/12 0636 06/25/12 0730  BP: 122/71 130/57 112/55 181/79  Pulse: 72 62 58 87  Temp: 98.4 F (36.9 C) 98 F (36.7 C) 97.9 F (36.6 C) 98.4 F (36.9 C)  TempSrc: Oral Oral Oral Oral  Resp: 18 17 17 18   Height:      Weight:  64 kg (141 lb 1.5 oz)    SpO2:  100% 100% 100%    General: axox3 Cardiovascular: rrr, nl S1, S2 Respiratory: bilat coarse crackles,   Discharge Instructions      Discharge Orders    Future Appointments: Provider: Department: Dept Phone: Center:   07/09/2012 8:15 AM Jomarie Longs, PA-C [facility] [place] [phone] None     Future Orders Please Complete By Expires   Diet - low sodium heart healthy      Increase activity slowly          Medication List     As of 06/25/2012  9:50 AM    STOP taking these medications         ADVIL PM PO      AMBULATORY NON FORMULARY MEDICATION      cloNIDine 0.2 mg/24hr patch   Commonly known as: CATAPRES - Dosed in mg/24 hr      HYDROcodone-acetaminophen 5-500 MG per tablet   Commonly  known as: VICODIN      meloxicam 15 MG tablet   Commonly known as: MOBIC      TAKE these medications         amitriptyline 50 MG tablet   Commonly known as: ELAVIL   Take 50 mg by mouth at bedtime.      amLODipine 5 MG tablet   Commonly known as: NORVASC   Take 1 tablet (5 mg total) by mouth daily.      cloNIDine 0.1 mg/24hr patch   Commonly known as: CATAPRES - Dosed in mg/24 hr   Place 1 patch (0.1 mg total) onto the skin once a week.      feeding supplement Liqd   Take 1 Container  by mouth 3 (three) times daily between meals.      furosemide 40 MG tablet   Commonly known as: LASIX   Take 1 tablet (40 mg total) by mouth daily.      ipratropium-albuterol 0.5-2.5 (3) MG/3ML Soln   Commonly known as: DUONEB   Take 3 mLs by nebulization every 4 (four) hours as needed.      Melatonin 10 MG Tabs   Take 1 tablet by mouth at bedtime.      metoprolol succinate 100 MG 24 hr tablet   Commonly known as: TOPROL-XL   Take 1 tablet (100 mg total) by mouth daily. Take with or immediately following a meal.      ondansetron 4 MG disintegrating tablet   Commonly known as: ZOFRAN-ODT   Take 1 tablet (4 mg total) by mouth every 8 (eight) hours as needed for nausea.      pantoprazole 40 MG tablet   Commonly known as: PROTONIX   Take 1 tablet (40 mg total) by mouth 2 (two) times daily before a meal.      prednisoLONE acetate 1 % ophthalmic suspension   Commonly known as: PRED FORTE   Place 1 drop into both eyes daily.      sucralfate 1 GM/10ML suspension   Commonly known as: CARAFATE   Take 10 mLs (1 g total) by mouth 4 (four) times daily -  with meals and at bedtime.      topiramate 25 MG tablet   Commonly known as: TOPAMAX   Take 1 tablet (25 mg total) by mouth at bedtime.      traMADol 50 MG tablet   Commonly known as: ULTRAM   Take 1 tablet (50 mg total) by mouth every 8 (eight) hours as needed.      valsartan 320 MG tablet   Commonly known as: DIOVAN   Take 1 tablet (320 mg total) by mouth daily.        Follow-up Information    Schedule an appointment as soon as possible for a visit with BREEBACK, JADE, PA-C.   Contact information:   [address] [place] [address] [phone]           The results of significant diagnostics from this hospitalization (including imaging, microbiology, ancillary and laboratory) are listed below for reference.    Significant Diagnostic Studies: Dg Ribs Unilateral W/chest Right  06/21/2012   *RADIOLOGY REPORT*  Clinical Data: Right-sided chest pain.  Shortness of breath. Generalized weakness.  RIGHT RIBS AND CHEST - 3+ VIEW  Comparison: Two-view chest x-ray 05/14/2012, 11/21/2011.  No prior rib imaging.  Findings: No fractures identified involving the right ribs. Osteopenia.  Costal cartilage calcification.  Cardiac silhouette upper normal in size to  slightly enlarged but stable.  Thoracic aorta atherosclerotic, unchanged.  Hilar and mediastinal contours otherwise unremarkable.  Changes of pulmonary fibrosis in both lungs, right greater than left, with pleuroparenchymal scarring at both lung bases and parenchymal scarring in the right upper lobe, unchanged.  No new pulmonary parenchymal abnormalities.  IMPRESSION:  1.  No right rib fractures identified. 2.  No acute cardiopulmonary disease.  Stable changes of pulmonary fibrosis, pleuroparenchymal scarring at the lung bases, and parenchymal scarring in the right upper lobe.   Original Report Authenticated By: Hulan Saas, M.D.    Dg Ankle Complete Left  06/14/2012  *RADIOLOGY REPORT*  Clinical Data: Left ankle injury.  LEFT ANKLE COMPLETE - 3+ VIEW  Comparison: None.  Findings: No acute fracture or dislocation identified.  Mild degenerative changes are seen at the level of the medial malleolus. There is suggestion of soft tissue swelling overlying the lateral malleolus.  IMPRESSION: Soft tissue swelling without visible acute fracture.   Original Report Authenticated By: Irish Lack, M.D.    Ct Abdomen Pelvis W Contrast  06/21/2012  *RADIOLOGY REPORT*  Clinical Data: .  Abdominal pain and diarrhea.  CT ABDOMEN AND PELVIS WITH CONTRAST  Technique:  Multidetector CT imaging of the abdomen and pelvis was performed following the standard protocol during bolus administration of intravenous contrast.  Contrast: OMNIPAQUE IOHEXOL 300 MG/ML  SOLN  Comparison: None.  Findings: Pulmonary scarring in both lung bases.  Small right pleural  effusion.  Liver and bile ducts are normal.  Gallbladder has been removed. The pancreas is atrophic.  The spleen is normal.  Kidneys show no obstruction or mass.  2.5 cm right renal cyst.  No renal calculi. There is liquid stool in the colon.  Small bowel is not dilated. No free fluid in the abdomen.  No mass or adenopathy.  No acute bony abnormality.  IMPRESSION: Bibasilar lung scarring.  Small right pleural effusion.  Liquid stool in the colon may represent gastroenteritis.  No bowel obstruction.  Sigmoid diverticulosis without diverticulitis.   Original Report Authenticated By: Janeece Riggers, M.D.    Dg Foot Complete Left  06/14/2012  *RADIOLOGY REPORT*  Clinical Data: Injury with left foot and ankle pain.  LEFT FOOT - COMPLETE 3+ VIEW  Comparison: None.  Findings: No acute fractures are identified.  There is a hallux valgus deformity and suggestion of chronic subluxation at the first MTP joint.  Mild diffuse degenerative changes are seen throughout the foot.  Previously placed tacks are identified at the level of the cuboid.  No bony lesions or destruction.  Soft tissues are unremarkable.  IMPRESSION: No acute fracture.  Hallux valgus deformity and suggestion of chronic subluxation at the first MTP joint.   Original Report Authenticated By: Irish Lack, M.D.     Microbiology: Recent Results (from the past 240 hour(s))  CLOSTRIDIUM DIFFICILE BY PCR     Status: Normal   Collection Time   06/23/12  6:33 PM      Component Value Range Status Comment   C difficile by pcr NEGATIVE  NEGATIVE Final      Labs: Basic Metabolic Panel:  Lab 06/24/12 1478 06/23/12 0700 06/22/12 0640 06/21/12 1615 06/20/12 1118  NA 137 139 137 132* 136  K 4.0 3.3* 3.6 3.5 3.7  CL 104 104 107 99 97  CO2 26 25 23 22 26   GLUCOSE 107* 96 93 110* 131*  BUN 14 11 12 18  28*  CREATININE 1.13* 0.99 0.98 1.00 1.10  CALCIUM 8.6 8.8 8.5  8.6 9.2  MG -- -- -- -- --  PHOS -- -- -- -- --   Liver Function Tests:  Lab 06/20/12  1118  AST 22  ALT 13  ALKPHOS 102  BILITOT 0.2*  PROT 7.3  ALBUMIN 3.0*    Lab 06/20/12 1118  LIPASE 25  AMYLASE --   No results found for this basename: AMMONIA:5 in the last 168 hours CBC:  Lab 06/25/12 0700 06/24/12 0940 06/24/12 0655 06/23/12 1220 06/23/12 0700 06/21/12 1615 06/20/12 1118  WBC 8.8 9.0 8.1 9.8 7.9 -- --  NEUTROABS -- -- -- -- -- 11.2* 10.9*  HGB 7.9* 8.4* 7.8* 8.4* 8.3* -- --  HCT 23.3* 25.8* 23.4* 25.6* 24.2* -- --  MCV 84.4 84.6 84.5 83.9 83.7 -- --  PLT 354 389 356 378 334 -- --   Cardiac Enzymes:  Lab 06/22/12 1554 06/22/12 0920 06/22/12 0640 06/21/12 1615  CKTOTAL -- -- -- --  CKMB -- -- -- --  CKMBINDEX -- -- -- --  TROPONINI <0.30 <0.30 <0.30 <0.30   BNP: BNP (last 3 results)  Basename 11/21/11 1040  PROBNP 140.0*   CBG:  Lab 06/25/12 0733 06/24/12 0836 06/23/12 0813 06/22/12 2055  GLUCAP 94 104* 89 95       Signed:  Tenishia Ekman  [facility] 06/25/2012, 9:50 AM

## 2012-06-25 NOTE — Clinical Social Work Psychosocial (Signed)
Clinical Social Work Department BRIEF PSYCHOSOCIAL ASSESSMENT 06/25/2012  Patient:  [name], [name]     Account Number:  [id number]     Admit date:  06/21/2012  Clinical Social Worker:  Delmer Islam  Date/Time:  06/25/2012 05:37 AM  Referred by:  Physician  Date Referred:  06/23/2012 Referred for  SNF Placement   Other Referral:   Interview type:  Family Other interview type:   CSW talked with patient's daughter [name] [phone])    PSYCHOSOCIAL DATA Living Status:  ALONE Admitted from facility:   Level of care:   Primary support name:  [name] Primary support relationship to patient:  CHILD, ADULT Degree of support available:   Strong support    CURRENT CONCERNS Current Concerns  Post-Acute Placement   Other Concerns:    SOCIAL WORK ASSESSMENT / PLAN On 06/24/12 CSW talked with patient's daughter [name] regarding MD/PT recommendation of ST rehab at a skilled facility. Patient in agreement and her preference is [name]. Daughter given SNF list for [facility] to have a 2nd choice.   Assessment/plan status:  Psychosocial Support/Ongoing Assessment of Needs Other assessment/ plan:   Information/referral to community resources:   Daughter given Clinical research associate for [facility].    PATIENT'S/FAMILY'S RESPONSE TO PLAN OF CARE: [name] and CSW discussed SNF placement and daughter's questions answered. Daughter expressed appreciation for CSW's assistance.

## 2012-06-25 NOTE — Progress Notes (Signed)
Report called to Neysa Bonito, nurse at [facility] facility. Pt remains stable. IV DC'd. EMS transport to transport pt to facility. [place], Rosanna Randy

## 2012-06-25 NOTE — Clinical Social Work Placement (Signed)
Clinical Social Work Department CLINICAL SOCIAL WORK PLACEMENT NOTE 06/25/2012  Patient:  [name], [name]  Account Number:  [id number] Admit date:  06/21/2012  Clinical Social Worker:  Genelle Bal, LCSW  Date/time:  06/25/2012 05:42 AM  Clinical Social Work is seeking post-discharge placement for this patient at the following level of care:   SKILLED NURSING   (*CSW will update this form in Epic as items are completed)   06/24/2012  Patient/family provided with [facility] Department of Clinical Social Work's list of facilities offering this level of care within the geographic area requested by the patient (or if unable, by the patient's family).  06/24/2012  Patient/family informed of their freedom to choose among providers that offer the needed level of care, that participate in Medicare, Medicaid or managed care program needed by the patient, have an available bed and are willing to accept the patient.    Patient/family informed of [facility]' ownership interest in [facility], as well as of the fact that they are under no obligation to receive care at this facility.  PASARR submitted to EDS on  PASARR number received from EDS on   FL2 transmitted to all facilities in geographic area requested by pt/family on  06/24/2012 FL2 transmitted to all facilities within larger geographic area on   Patient informed that his/her managed care company has contracts with or will negotiate with  certain facilities, including the following:     Patient/family informed of bed offers received:  06/24/2012 Patient chooses bed at [facility] Physician recommends and patient chooses bed at    Patient to be transferred to [facility] on  06/25/2012 Patient to be transferred to facility by   The following physician request were entered in Epic:   Additional Comments: Patient has a PASARR number.

## 2012-07-01 ENCOUNTER — Other Ambulatory Visit: Payer: Self-pay | Admitting: Gastroenterology

## 2012-07-01 DIAGNOSIS — R1310 Dysphagia, unspecified: Secondary | ICD-10-CM

## 2012-07-09 ENCOUNTER — Ambulatory Visit: Payer: Medicare Other | Admitting: Physician Assistant

## 2012-07-09 DIAGNOSIS — Z0289 Encounter for other administrative examinations: Secondary | ICD-10-CM

## 2012-07-10 ENCOUNTER — Telehealth: Payer: Self-pay | Admitting: *Deleted

## 2012-07-10 DIAGNOSIS — J9610 Chronic respiratory failure, unspecified whether with hypoxia or hypercapnia: Secondary | ICD-10-CM

## 2012-07-10 DIAGNOSIS — R7981 Abnormal blood-gas level: Secondary | ICD-10-CM

## 2012-07-10 NOTE — Telephone Encounter (Signed)
[name]'s daughter [name] called reporting [name] will not be staying in the nursing home and will be coming home with a need for 02. Her insurance needs her to have a testing done before they will approve her for 02 in the home.  I was told we do not do that here and she needs referral to a site that does testing. Thank you.

## 2012-07-11 ENCOUNTER — Other Ambulatory Visit: Payer: Medicare Other

## 2012-07-11 NOTE — Telephone Encounter (Signed)
Pt.notified

## 2012-07-11 NOTE — Telephone Encounter (Signed)
I sent over referral for respiratory therapy. Let pt know.

## 2012-07-12 ENCOUNTER — Inpatient Hospital Stay (HOSPITAL_COMMUNITY)
Admission: EM | Admit: 2012-07-12 | Discharge: 2012-07-21 | DRG: 392 | Disposition: A | Discharge status: Skilled Nursing Facility | Payer: Medicare Other | Attending: Internal Medicine | Admitting: Internal Medicine

## 2012-07-12 ENCOUNTER — Encounter (HOSPITAL_COMMUNITY): Payer: Self-pay

## 2012-07-12 ENCOUNTER — Emergency Department (HOSPITAL_COMMUNITY): Payer: Medicare Other

## 2012-07-12 DIAGNOSIS — Z951 Presence of aortocoronary bypass graft: Secondary | ICD-10-CM

## 2012-07-12 DIAGNOSIS — I10 Essential (primary) hypertension: Secondary | ICD-10-CM | POA: Diagnosis present

## 2012-07-12 DIAGNOSIS — E785 Hyperlipidemia, unspecified: Secondary | ICD-10-CM | POA: Diagnosis present

## 2012-07-12 DIAGNOSIS — D62 Acute posthemorrhagic anemia: Secondary | ICD-10-CM | POA: Diagnosis present

## 2012-07-12 DIAGNOSIS — Z947 Corneal transplant status: Secondary | ICD-10-CM

## 2012-07-12 DIAGNOSIS — H548 Legal blindness, as defined in USA: Secondary | ICD-10-CM | POA: Diagnosis present

## 2012-07-12 DIAGNOSIS — R197 Diarrhea, unspecified: Secondary | ICD-10-CM

## 2012-07-12 DIAGNOSIS — M349 Systemic sclerosis, unspecified: Secondary | ICD-10-CM | POA: Diagnosis present

## 2012-07-12 DIAGNOSIS — K208 Other esophagitis without bleeding: Principal | ICD-10-CM | POA: Diagnosis present

## 2012-07-12 DIAGNOSIS — Z952 Presence of prosthetic heart valve: Secondary | ICD-10-CM

## 2012-07-12 DIAGNOSIS — Z79899 Other long term (current) drug therapy: Secondary | ICD-10-CM

## 2012-07-12 DIAGNOSIS — D72829 Elevated white blood cell count, unspecified: Secondary | ICD-10-CM

## 2012-07-12 DIAGNOSIS — N179 Acute kidney failure, unspecified: Secondary | ICD-10-CM

## 2012-07-12 DIAGNOSIS — J479 Bronchiectasis, uncomplicated: Secondary | ICD-10-CM

## 2012-07-12 DIAGNOSIS — J9610 Chronic respiratory failure, unspecified whether with hypoxia or hypercapnia: Secondary | ICD-10-CM

## 2012-07-12 DIAGNOSIS — M79672 Pain in left foot: Secondary | ICD-10-CM

## 2012-07-12 DIAGNOSIS — R0600 Dyspnea, unspecified: Secondary | ICD-10-CM

## 2012-07-12 DIAGNOSIS — Z953 Presence of xenogenic heart valve: Secondary | ICD-10-CM

## 2012-07-12 DIAGNOSIS — G4733 Obstructive sleep apnea (adult) (pediatric): Secondary | ICD-10-CM | POA: Diagnosis present

## 2012-07-12 DIAGNOSIS — M79671 Pain in right foot: Secondary | ICD-10-CM

## 2012-07-12 DIAGNOSIS — Z96659 Presence of unspecified artificial knee joint: Secondary | ICD-10-CM

## 2012-07-12 DIAGNOSIS — D649 Anemia, unspecified: Secondary | ICD-10-CM

## 2012-07-12 DIAGNOSIS — K922 Gastrointestinal hemorrhage, unspecified: Secondary | ICD-10-CM | POA: Diagnosis present

## 2012-07-12 DIAGNOSIS — I2510 Atherosclerotic heart disease of native coronary artery without angina pectoris: Secondary | ICD-10-CM | POA: Diagnosis present

## 2012-07-12 DIAGNOSIS — K219 Gastro-esophageal reflux disease without esophagitis: Secondary | ICD-10-CM | POA: Diagnosis present

## 2012-07-12 DIAGNOSIS — N390 Urinary tract infection, site not specified: Secondary | ICD-10-CM | POA: Diagnosis present

## 2012-07-12 DIAGNOSIS — J310 Chronic rhinitis: Secondary | ICD-10-CM

## 2012-07-12 DIAGNOSIS — M19079 Primary osteoarthritis, unspecified ankle and foot: Secondary | ICD-10-CM | POA: Diagnosis present

## 2012-07-12 DIAGNOSIS — Z66 Do not resuscitate: Secondary | ICD-10-CM | POA: Diagnosis present

## 2012-07-12 DIAGNOSIS — K449 Diaphragmatic hernia without obstruction or gangrene: Secondary | ICD-10-CM | POA: Diagnosis present

## 2012-07-12 DIAGNOSIS — K920 Hematemesis: Secondary | ICD-10-CM | POA: Diagnosis present

## 2012-07-12 DIAGNOSIS — E876 Hypokalemia: Secondary | ICD-10-CM | POA: Diagnosis not present

## 2012-07-12 DIAGNOSIS — I720 Aneurysm of carotid artery: Secondary | ICD-10-CM

## 2012-07-12 DIAGNOSIS — K2210 Ulcer of esophagus without bleeding: Secondary | ICD-10-CM

## 2012-07-12 DIAGNOSIS — M7070 Other bursitis of hip, unspecified hip: Secondary | ICD-10-CM

## 2012-07-12 DIAGNOSIS — J9620 Acute and chronic respiratory failure, unspecified whether with hypoxia or hypercapnia: Secondary | ICD-10-CM

## 2012-07-12 DIAGNOSIS — A498 Other bacterial infections of unspecified site: Secondary | ICD-10-CM | POA: Diagnosis present

## 2012-07-12 DIAGNOSIS — E861 Hypovolemia: Secondary | ICD-10-CM | POA: Diagnosis present

## 2012-07-12 HISTORY — DX: Unspecified osteoarthritis, unspecified site: M19.90

## 2012-07-12 HISTORY — DX: Encounter for other specified aftercare: Z51.89

## 2012-07-12 HISTORY — DX: Pneumonia, unspecified organism: J18.9

## 2012-07-12 HISTORY — DX: Psoriasis, unspecified: L40.9

## 2012-07-12 HISTORY — DX: Hypothyroidism, unspecified: E03.9

## 2012-07-12 LAB — CBC
HCT: 25.1 % — ABNORMAL LOW (ref 36.0–46.0)
Hemoglobin: 8.2 g/dL — ABNORMAL LOW (ref 12.0–15.0)
MCH: 26.8 pg (ref 26.0–34.0)
MCH: 27 pg (ref 26.0–34.0)
MCHC: 32.7 g/dL (ref 30.0–36.0)
MCHC: 32.7 g/dL (ref 30.0–36.0)
MCV: 81.9 fL (ref 78.0–100.0)
MCV: 82.6 fL (ref 78.0–100.0)
Platelets: 425 10*3/uL — ABNORMAL HIGH (ref 150–400)
RBC: 3.1 MIL/uL — ABNORMAL LOW (ref 3.87–5.11)
RDW: 14.5 % (ref 11.5–15.5)

## 2012-07-12 LAB — COMPREHENSIVE METABOLIC PANEL
AST: 50 U/L — ABNORMAL HIGH (ref 0–37)
Albumin: 3 g/dL — ABNORMAL LOW (ref 3.5–5.2)
BUN: 38 mg/dL — ABNORMAL HIGH (ref 6–23)
Calcium: 8.5 mg/dL (ref 8.4–10.5)
Creatinine, Ser: 1.27 mg/dL — ABNORMAL HIGH (ref 0.50–1.10)
Total Bilirubin: 0.2 mg/dL — ABNORMAL LOW (ref 0.3–1.2)
Total Protein: 6.7 g/dL (ref 6.0–8.3)

## 2012-07-12 LAB — CBC WITH DIFFERENTIAL/PLATELET
Basophils Absolute: 0.1 10*3/uL (ref 0.0–0.1)
Basophils Relative: 0 % (ref 0–1)
Eosinophils Absolute: 0 10*3/uL (ref 0.0–0.7)
Eosinophils Relative: 0 % (ref 0–5)
HCT: 27.6 % — ABNORMAL LOW (ref 36.0–46.0)
Hemoglobin: 9 g/dL — ABNORMAL LOW (ref 12.0–15.0)
MCH: 26.6 pg (ref 26.0–34.0)
MCHC: 32.6 g/dL (ref 30.0–36.0)
Monocytes Absolute: 0.8 10*3/uL (ref 0.1–1.0)
Monocytes Relative: 5 % (ref 3–12)
Neutro Abs: 15.2 10*3/uL — ABNORMAL HIGH (ref 1.7–7.7)
RDW: 14.2 % (ref 11.5–15.5)

## 2012-07-12 LAB — URINALYSIS, ROUTINE W REFLEX MICROSCOPIC
Ketones, ur: NEGATIVE mg/dL
Protein, ur: NEGATIVE mg/dL
Urobilinogen, UA: 0.2 mg/dL (ref 0.0–1.0)

## 2012-07-12 LAB — URINE MICROSCOPIC-ADD ON

## 2012-07-12 LAB — PROTIME-INR
INR: 1.07 (ref 0.00–1.49)
Prothrombin Time: 13.8 seconds (ref 11.6–15.2)

## 2012-07-12 LAB — APTT: aPTT: 27 seconds (ref 24–37)

## 2012-07-12 LAB — MRSA PCR SCREENING: MRSA by PCR: NEGATIVE

## 2012-07-12 MED ORDER — IPRATROPIUM BROMIDE 0.02 % IN SOLN: 0.5000 mg | RESPIRATORY_TRACT | Status: DC | PRN

## 2012-07-12 MED ORDER — MELATONIN 10 MG PO TABS
1.0000 | ORAL_TABLET | Freq: Every day | ORAL | Status: DC
Start: 2012-07-12 — End: 2012-07-12
  Filled 2012-07-12: qty 1

## 2012-07-12 MED ORDER — ALBUTEROL SULFATE (5 MG/ML) 0.5% IN NEBU: 2.5000 mg | INHALATION_SOLUTION | RESPIRATORY_TRACT | Status: DC | PRN

## 2012-07-12 MED ORDER — ACETAMINOPHEN 325 MG PO TABS
650.0000 mg | ORAL_TABLET | Freq: Four times a day (QID) | ORAL | Status: DC | PRN
  Administered 2012-07-17: 650 mg via ORAL
  Filled 2012-07-12: qty 2

## 2012-07-12 MED ORDER — PREDNISOLONE ACETATE 1 % OP SUSP
1.0000 [drp] | Freq: Every day | OPHTHALMIC | Status: DC
Start: 2012-07-12 — End: 2012-07-21
  Administered 2012-07-12 – 2012-07-20 (×8): 1 [drp] via OPHTHALMIC
  Filled 2012-07-12: qty 1

## 2012-07-12 MED ORDER — SODIUM CHLORIDE 0.9 % IV SOLN
INTRAVENOUS | Status: DC
  Administered 2012-07-12: 17:00:00 via INTRAVENOUS

## 2012-07-12 MED ORDER — AMLODIPINE BESYLATE 5 MG PO TABS
5.0000 mg | ORAL_TABLET | Freq: Every day | ORAL | Status: DC
  Administered 2012-07-12 – 2012-07-21 (×10): 5 mg via ORAL
  Filled 2012-07-12 (×10): qty 1

## 2012-07-12 MED ORDER — OXYMETAZOLINE HCL 0.05 % NA SOLN
1.0000 | Freq: Once | NASAL | Status: AC
  Administered 2012-07-12: 1 via NASAL
  Filled 2012-07-12: qty 15

## 2012-07-12 MED ORDER — SODIUM CHLORIDE 0.9 % IV SOLN
8.0000 mg/h | INTRAVENOUS | Status: DC
  Administered 2012-07-12 – 2012-07-20 (×14): 8 mg/h via INTRAVENOUS
  Filled 2012-07-12 (×33): qty 80

## 2012-07-12 MED ORDER — AMITRIPTYLINE HCL 50 MG PO TABS
50.0000 mg | ORAL_TABLET | Freq: Every day | ORAL | Status: DC
  Administered 2012-07-12 – 2012-07-20 (×9): 50 mg via ORAL
  Filled 2012-07-12 (×10): qty 1

## 2012-07-12 MED ORDER — ONDANSETRON HCL 4 MG/2ML IJ SOLN: 4.0000 mg | Freq: Four times a day (QID) | INTRAMUSCULAR | Status: DC | PRN

## 2012-07-12 MED ORDER — SODIUM CHLORIDE 0.9 % IJ SOLN
3.0000 mL | Freq: Two times a day (BID) | INTRAMUSCULAR | Status: DC
  Administered 2012-07-13 – 2012-07-20 (×7): 3 mL via INTRAVENOUS

## 2012-07-12 MED ORDER — LIDOCAINE VISCOUS 2 % MT SOLN
20.0000 mL | Freq: Once | OROMUCOSAL | Status: AC
  Administered 2012-07-12: 15 mL via OROMUCOSAL
  Filled 2012-07-12: qty 15

## 2012-07-12 MED ORDER — SUCRALFATE 1 GM/10ML PO SUSP
1.0000 g | Freq: Three times a day (TID) | ORAL | Status: DC
  Administered 2012-07-12 – 2012-07-21 (×33): 1 g via ORAL
  Filled 2012-07-12 (×40): qty 10

## 2012-07-12 MED ORDER — CLONIDINE HCL 0.1 MG/24HR TD PTWK
0.1000 mg | MEDICATED_PATCH | TRANSDERMAL | Status: DC
  Administered 2012-07-16: 0.1 mg via TRANSDERMAL
  Filled 2012-07-12: qty 1

## 2012-07-12 MED ORDER — SODIUM CHLORIDE 0.9 % IV SOLN
80.0000 mg | Freq: Once | INTRAVENOUS | Status: AC
  Administered 2012-07-12: 80 mg via INTRAVENOUS
  Filled 2012-07-12: qty 80

## 2012-07-12 MED ORDER — METOPROLOL SUCCINATE ER 100 MG PO TB24
100.0000 mg | ORAL_TABLET | Freq: Every day | ORAL | Status: DC
  Administered 2012-07-12 – 2012-07-21 (×10): 100 mg via ORAL
  Filled 2012-07-12 (×10): qty 1

## 2012-07-12 MED ORDER — ONDANSETRON HCL 4 MG PO TABS: 4.0000 mg | ORAL_TABLET | Freq: Four times a day (QID) | ORAL | Status: DC | PRN

## 2012-07-12 MED ORDER — TOPIRAMATE 25 MG PO TABS
25.0000 mg | ORAL_TABLET | Freq: Every day | ORAL | Status: DC
  Administered 2012-07-12 – 2012-07-20 (×9): 25 mg via ORAL
  Filled 2012-07-12 (×10): qty 1

## 2012-07-12 MED ORDER — TRAMADOL HCL 50 MG PO TABS
50.0000 mg | ORAL_TABLET | Freq: Three times a day (TID) | ORAL | Status: DC | PRN
  Administered 2012-07-15 – 2012-07-17 (×2): 50 mg via ORAL
  Filled 2012-07-12 (×3): qty 1

## 2012-07-12 MED ORDER — ACETAMINOPHEN 650 MG RE SUPP: 650.0000 mg | Freq: Four times a day (QID) | RECTAL | Status: DC | PRN

## 2012-07-12 NOTE — ED Notes (Signed)
NGT attempted twice, tube entered lung twice and not stomach.

## 2012-07-12 NOTE — Consult Note (Signed)
[facility] Consultation Note  Referring Provider:  Dr. Renae Fickle ([facility])  Reason for Consultation:  hematemesis  HPI: [name] is a 78 y.o. female admitted for hematemesis.  Patient is accompanied by her daughter and son-in-low, who both provide most of the history.  She is legally blind and was admitted a couple weeks ago for similar symptoms, and had endoscopy at that time showing ulcerative esophagitis, with biopsies showing no evidence of infection or malignancy.  Was discharged to [facility] after that admission and just returned home yesterday.  In the early hours of this morning, she had a couple episodes of dark red emesis, and has had some black-tinged stool.  Has epigastric pain.  Protonix 40 mg bid and Carafate 1 g qid are on her [facility] medication list.     Past Medical History  Diagnosis Date  . Hypertension   . Hyperlipidemia   . OSA (obstructive sleep apnea)     not on CPAP  . Bronchiectasis   . Aortic stenosis     s/p AV replacement with pig valve  . CAD (coronary artery disease)     Present PCI and MI  . GERD (gastroesophageal reflux disease)   . PUD (peptic ulcer disease)   . Legally blind   . Scleroderma     Caused hemoptysis about 3 years ago  . Blood transfusion without reported diagnosis   . Psoriasis   . Hypothyroidism     hx of, but not currently and not on medications  . Osteoarthritis     Past Surgical History  Procedure Date  . Cardiac valve replacement 2011  . Coronary artery bypass graft 2011  . Left shoulder surgery  many years ago    arthoscopy at [facility] twice many years ago  . Corneal transplant 2003  . Cholecystectomy 2007  . Appendectomy   . Abdominal hysterectomy 1940  . Total knee arthroplasty 2000    left knee  . Esophagogastroduodenoscopy 06/23/2012    Procedure: ESOPHAGOGASTRODUODENOSCOPY (EGD);  Surgeon: Petra Kuba, MD;  Location: [facility] ENDOSCOPY;  Service: Endoscopy;  Laterality: N/A;    Prior  to Admission medications   Medication Sig Start Date End Date Taking? Authorizing Provider  amitriptyline (ELAVIL) 50 MG tablet Take 50 mg by mouth at bedtime.   Yes Historical Provider, MD  amLODipine (NORVASC) 5 MG tablet Take 1 tablet (5 mg total) by mouth daily. 06/04/12 06/04/13 Yes Jade L Breeback, PA-C  cloNIDine (CATAPRES - DOSED IN MG/24 HR) 0.1 mg/24hr patch Place 1 patch onto the skin once a week. Thursday   Yes Historical Provider, MD  furosemide (LASIX) 40 MG tablet Take 1 tablet (40 mg total) by mouth daily. 04/30/12  Yes Jade L Breeback, PA-C  ipratropium-albuterol (DUONEB) 0.5-2.5 (3) MG/3ML SOLN Take 3 mLs by nebulization every 4 (four) hours as needed. 05/21/12  Yes Jade L Breeback, PA-C  Melatonin 10 MG TABS Take 1 tablet by mouth at bedtime.   Yes Historical Provider, MD  metoprolol succinate (TOPROL-XL) 100 MG 24 hr tablet Take 1 tablet (100 mg total) by mouth daily. Take with or immediately following a meal. 04/11/12  Yes Jade L Breeback, PA-C  ondansetron (ZOFRAN ODT) 4 MG disintegrating tablet Take 1 tablet (4 mg total) by mouth every 8 (eight) hours as needed for nausea. 06/20/12  Yes Roxy Horseman, PA-C  pantoprazole (PROTONIX) 40 MG tablet Take 1 tablet (40 mg total) by mouth 2 (two) times daily before a meal. 06/25/12  Yes Sorin C Lavera Guise,  MD  prednisoLONE acetate (PRED FORTE) 1 % ophthalmic suspension Place 1 drop into both eyes daily. 01/29/12  Yes Lewayne Bunting, MD  sucralfate (CARAFATE) 1 GM/10ML suspension Take 10 mLs (1 g total) by mouth 4 (four) times daily -  with meals and at bedtime. 06/25/12  Yes Sorin Luanne Bras, MD  topiramate (TOPAMAX) 25 MG tablet Take 1 tablet (25 mg total) by mouth at bedtime. 06/04/12  Yes Jade L Breeback, PA-C  traMADol (ULTRAM) 50 MG tablet Take 1 tablet (50 mg total) by mouth every 8 (eight) hours as needed. 06/25/12  Yes Sorin Luanne Bras, MD  valsartan (DIOVAN) 320 MG tablet Take 1 tablet (320 mg total) by mouth daily. 01/28/12  Yes Lewayne Bunting,  MD  feeding supplement (RESOURCE BREEZE) LIQD Take 1 Container by mouth 3 (three) times daily between meals. 06/25/12   Sorin Luanne Bras, MD    Current Facility-Administered Medications  Medication Dose Route Frequency Provider Last Rate Last Dose  . pantoprazole (PROTONIX) 80 mg in sodium chloride 0.9 % 250 mL infusion  8 mg/hr Intravenous Continuous Gwyneth Sprout, MD 25 mL/hr at 07/12/12 1208 8 mg/hr at 07/12/12 1208   Current Outpatient Prescriptions  Medication Sig Dispense Refill  . amitriptyline (ELAVIL) 50 MG tablet Take 50 mg by mouth at bedtime.      Marland Kitchen amLODipine (NORVASC) 5 MG tablet Take 1 tablet (5 mg total) by mouth daily.  90 tablet  0  . cloNIDine (CATAPRES - DOSED IN MG/24 HR) 0.1 mg/24hr patch Place 1 patch onto the skin once a week. Thursday      . furosemide (LASIX) 40 MG tablet Take 1 tablet (40 mg total) by mouth daily.  30 tablet  0  . ipratropium-albuterol (DUONEB) 0.5-2.5 (3) MG/3ML SOLN Take 3 mLs by nebulization every 4 (four) hours as needed.  360 mL  11  . Melatonin 10 MG TABS Take 1 tablet by mouth at bedtime.      . metoprolol succinate (TOPROL-XL) 100 MG 24 hr tablet Take 1 tablet (100 mg total) by mouth daily. Take with or immediately following a meal.  90 tablet  0  . ondansetron (ZOFRAN ODT) 4 MG disintegrating tablet Take 1 tablet (4 mg total) by mouth every 8 (eight) hours as needed for nausea.  10 tablet  0  . pantoprazole (PROTONIX) 40 MG tablet Take 1 tablet (40 mg total) by mouth 2 (two) times daily before a meal.      . prednisoLONE acetate (PRED FORTE) 1 % ophthalmic suspension Place 1 drop into both eyes daily.  5 mL  4  . sucralfate (CARAFATE) 1 GM/10ML suspension Take 10 mLs (1 g total) by mouth 4 (four) times daily -  with meals and at bedtime.  420 mL    . topiramate (TOPAMAX) 25 MG tablet Take 1 tablet (25 mg total) by mouth at bedtime.  90 tablet  0  . traMADol (ULTRAM) 50 MG tablet Take 1 tablet (50 mg total) by mouth every 8 (eight) hours as  needed.  30 tablet  0  . valsartan (DIOVAN) 320 MG tablet Take 1 tablet (320 mg total) by mouth daily.  90 tablet  4  . feeding supplement (RESOURCE BREEZE) LIQD Take 1 Container by mouth 3 (three) times daily between meals.        Allergies as of 07/12/2012 - Review Complete 07/12/2012  Allergen Reaction Noted  . Levofloxacin Hives 07/22/2011    Family History  Problem Relation Age of  Onset  . Heart disease Sister   . Heart disease Brother     MI x 2  . Lung disease Brother     ? dz, was a smoker    History   Social History  . Marital Status: Widowed    Spouse Name: N/A    Number of Children: 2  . Years of Education: N/A   Occupational History  . Retired    Social History Main Topics  . Smoking status: Never Smoker   . Smokeless tobacco: Never Used  . Alcohol Use: No  . Drug Use: No  . Sexually Active: Not on file   Other Topics Concern  . Not on file   Social History Narrative   Living at [facility] until 12/20, then went home with daughter.  She uses a cane and walker.  She also has a transport chair, but no wheelchair.  She also has a shower chair.  Cannot cook or pay bills, but can feed herself.  She needs assistance with bathing, but she needs some assistance with getting dressed due to blindness.     Review of Systems:  As per HPI all others negative.  Physical Exam: Vital signs in last 24 hours: Temp:  [98.7 F (37.1 C)-99.2 F (37.3 C)] 99.2 F (37.3 C) (12/21 1446) Pulse Rate:  [68-86] 84  (12/21 1446) Resp:  [20] 20  (12/21 1446) BP: (111-140)/(45-70) 127/61 mmHg (12/21 1446) SpO2:  [91 %-99 %] 99 % (12/21 1446)   General:   Alert,  Well-developed, well-nourished, pleasant and cooperative in NAD Head:  Normocephalic and atraumatic. Eyes:  Sclera clear, no icterus.   Legally blind; Conjunctiva pink. Ears:  Normal auditory acuity. Nose:  No deformity, discharge,  or lesions. Mouth:  No deformity or lesions. Edentulous; Oropharynx pink &  moist. Neck:  Supple; no masses or thyromegaly. Lungs:  Clear throughout to auscultation.   No wheezes, crackles, or rhonchi. No acute distress. Heart:  Regular rate and rhythm; no murmurs, clicks, rubs,  or gallops. Abdomen:  Soft, nontender and nondistended. No masses, hepatosplenomegaly or hernias noted. Normal bowel sounds, without guarding, and without rebound.     Msk:  Symmetrical without gross deformities. Normal posture. Pulses:  Normal pulses noted. Extremities:  Without clubbing or edema. Neurologic:  Alert and  oriented x4;  Diffusely weak, otherwise grossly normal neurologically. Skin:  Intact without significant lesions or rashes. Occasional ecchymoses Psych:  Alert and cooperative. Normal mood and affect.   Lab Results:  [facility] 07/12/12 1127  WBC 17.7*  HGB 9.0*  HCT 27.6*  PLT 454*   BMET  Basename 07/12/12 1127  NA 135  K 3.7  CL 100  CO2 24  GLUCOSE 119*  BUN 38*  CREATININE 1.27*  CALCIUM 8.5   LFT  Basename 07/12/12 1127  PROT 6.7  ALBUMIN 3.0*  AST 50*  ALT 19  ALKPHOS 90  BILITOT 0.2*  BILIDIR --  IBILI --   PT/INR  Basename 07/12/12 1127  LABPROT 13.8  INR 1.07    Studies/Results: Dg Chest Port 1 View  07/12/2012  *RADIOLOGY REPORT*  Clinical Data: Hematemesis.  Chest pain.  Current history of hypertension and bronchiectasis.  PORTABLE CHEST - 1 VIEW 07/12/2012 1143 hours:  Comparison: One-view chest x-ray 06/21/2012.  Two-view chest x-ray 05/14/2012, 11/21/2011.  Findings: Prior sternotomy.  Cardiac silhouette enlarged but stable.  Thoracic aorta atherosclerotic, unchanged.  Prominent central pulmonary arteries, unchanged.  Emphysematous changes in the upper lobes, unchanged.  Pulmonary fibrosis bilaterally,  right greater than left, unchanged.  No new pulmonary parenchymal abnormalities.  IMPRESSION: Stable cardiomegaly without pulmonary edema.  Stable COPD/emphysema and pulmonary fibrosis.  No acute cardiopulmonary disease.   Original  Report Authenticated By: Hulan Saas, M.D.     Impression:  1.  Hematemesis, hemodynamically stable. 2.  Odynophagia. 3.  History ulcerative esophagitis with recent endoscopy about 2 weeks ago.  Plan:  1.  Clear liquids ok. 2.  Carafate suspension one gram orally four times a day. 3.  Protonix 40 mg IV every 12 hours. 4.  If rampant hemodynamically unstable bleeding, will do expedited endoscopy.  Otherwise, plan to repeat endoscopy on Monday. 5.  Case discussed in detail with family and [facility] (Dr. Malachi Bonds).   LOS: 0 days   Elga Santy M  07/12/2012, 3:00 PM

## 2012-07-12 NOTE — H&P (Addendum)
[facility] History and Physical  Mechille Varghese FAO:[id number] DOB: 1919/04/29 DOA: 07/12/2012  Referring physician: Gwyneth Sprout PCP: Tandy Gaw, PA-C   Chief Complaint:  Bloody emesis and melena  HPI:  The patient is a 78 yo F with HTN, HLD, OSA, CAD s/p CABG, bioprosthetic aortic valve replacement, GERD, PUD, scleroderma, and recurrent UGI bleeds, who was recently admitted for UGI bleed.  In November, she presented with hematemesis and EGD demonstrated ulcerative esophagitis.  She was started on BID PPI and QID sucralfate and discharged to [facility], where she has lived up until yesterday.  She presents again with blood emesis and melena.  The patient states that she was feeling well until 2-3 days prior to admission, when she became fatigued and mildly Loletha Bertini of breath.  She does not routinely look at her stools and is unsure if she has had any black or dark/tarry stools during that time.  She was taken home by her daughter on the day prior to admission.  She was feeling lightheaded and weak and cold.  In the middle of the night, she woke up and felt sick to her stomach and vomited a small amount of dark blood.  She was also incontinent with a large melenotic stool.  An hour later, she had a second large melenotic stool.  Her family got her ready to come to the ED this morning, and on their way to the front door, she vomited several cups of bright red blood.  Her family called EMS and she was transported to the ED.  In the ED, her vital signs were stable.  Her hemoglobin was higher than it was at d/c a few weeks ago.  The ED staff attempted to place an NGT several times but were unsuccessful.  The patient was seen by GI in the ED, who recommended a clear liquid diet, IV Protonix and admission for observation.    Sore throat since.  She has been taking the carafate 1gm four times daily and protonix 40mg  by mouth BID.  She has been feeling tired and drained for the last two to three  days.  Review of Systems:  The patient also states that she has had a sore throat since her last EGD, but that it was been worse recently.  She has had a hard time eating and swallowing.  She denies fevers but has had some chills.  She is legally blind and has hearing loss.  She denies rhinorrhea, sinus congestion, cough, shortness of breath, chest pain, abdominal pain, diarrhea, constipation, dysuria, urinary urgency or frequency, skin rash.  She has had some mild pallor this morning.  She denies focal weakness or numbness.  Past Medical History  Diagnosis Date  . Hypertension   . Hyperlipidemia   . OSA (obstructive sleep apnea)     not on CPAP  . Bronchiectasis   . Aortic stenosis     s/p AV replacement with pig valve  . CAD (coronary artery disease)     Present PCI and MI  . GERD (gastroesophageal reflux disease)   . PUD (peptic ulcer disease)   . Legally blind   . Scleroderma     Caused hemoptysis about 3 years ago  . Blood transfusion without reported diagnosis   . Psoriasis   . Hypothyroidism     hx of, but not currently and not on medications  . Osteoarthritis    Past Surgical History  Procedure Date  . Cardiac valve replacement 2011  . Coronary artery bypass  graft 2011  . Left shoulder surgery  many years ago    arthoscopy at [facility] twice many years ago  . Corneal transplant 2003  . Cholecystectomy 2007  . Appendectomy   . Abdominal hysterectomy 1940  . Total knee arthroplasty 2000    left knee  . Esophagogastroduodenoscopy 06/23/2012    Procedure: ESOPHAGOGASTRODUODENOSCOPY (EGD);  Surgeon: Petra Kuba, MD;  Location: [facility] ENDOSCOPY;  Service: Endoscopy;  Laterality: N/A;   Social History:  reports that she has never smoked. She has never used smokeless tobacco. She reports that she does not drink alcohol or use illicit drugs. Living at [facility] until 12/20, then went home with daughter.  She uses a cane and walker.  She also has a transport chair, but no  wheelchair.  She also has a shower chair.  Cannot cook or pay bills, but can feed herself.  She needs assistance with bathing, but she needs some assistance with getting dressed due to blindness.   Allergies  Allergen Reactions  . Levofloxacin Hives    Family History  Problem Relation Age of Onset  . Heart disease Sister   . Heart disease Brother     MI x 2  . Lung disease Brother     ? dz, was a smoker    Prior to Admission medications   Medication Sig Start Date End Date Taking? Authorizing Provider  amitriptyline (ELAVIL) 50 MG tablet Take 50 mg by mouth at bedtime.   Yes Historical Provider, MD  amLODipine (NORVASC) 5 MG tablet Take 1 tablet (5 mg total) by mouth daily. 06/04/12 06/04/13 Yes Jade L Breeback, PA-C  cloNIDine (CATAPRES - DOSED IN MG/24 HR) 0.1 mg/24hr patch Place 1 patch onto the skin once a week. Thursday   Yes Historical Provider, MD  furosemide (LASIX) 40 MG tablet Take 1 tablet (40 mg total) by mouth daily. 04/30/12  Yes Jade L Breeback, PA-C  ipratropium-albuterol (DUONEB) 0.5-2.5 (3) MG/3ML SOLN Take 3 mLs by nebulization every 4 (four) hours as needed. 05/21/12  Yes Jade L Breeback, PA-C  Melatonin 10 MG TABS Take 1 tablet by mouth at bedtime.   Yes Historical Provider, MD  metoprolol succinate (TOPROL-XL) 100 MG 24 hr tablet Take 1 tablet (100 mg total) by mouth daily. Take with or immediately following a meal. 04/11/12  Yes Jade L Breeback, PA-C  ondansetron (ZOFRAN ODT) 4 MG disintegrating tablet Take 1 tablet (4 mg total) by mouth every 8 (eight) hours as needed for nausea. 06/20/12  Yes Roxy Horseman, PA-C  pantoprazole (PROTONIX) 40 MG tablet Take 1 tablet (40 mg total) by mouth 2 (two) times daily before a meal. 06/25/12  Yes Sorin Luanne Bras, MD  prednisoLONE acetate (PRED FORTE) 1 % ophthalmic suspension Place 1 drop into both eyes daily. 01/29/12  Yes Lewayne Bunting, MD  sucralfate (CARAFATE) 1 GM/10ML suspension Take 10 mLs (1 g total) by mouth 4 (four)  times daily -  with meals and at bedtime. 06/25/12  Yes Sorin Luanne Bras, MD  topiramate (TOPAMAX) 25 MG tablet Take 1 tablet (25 mg total) by mouth at bedtime. 06/04/12  Yes Jade L Breeback, PA-C  traMADol (ULTRAM) 50 MG tablet Take 1 tablet (50 mg total) by mouth every 8 (eight) hours as needed. 06/25/12  Yes Sorin Luanne Bras, MD  valsartan (DIOVAN) 320 MG tablet Take 1 tablet (320 mg total) by mouth daily. 01/28/12  Yes Lewayne Bunting, MD  feeding supplement (RESOURCE BREEZE) LIQD Take 1 Container by mouth  3 (three) times daily between meals. 06/25/12   Sorin Luanne Bras, MD   Physical Exam: Filed Vitals:   07/12/12 1500 07/12/12 1540 07/12/12 1705 07/12/12 1933  BP:  134/61 127/51 102/47  Pulse:  86 80   Temp:  99.3 F (37.4 C)  98.3 F (36.8 C)  TempSrc:  Oral  Oral  Resp:  18    Height: 4' 11.84" (1.52 m)     Weight: 64 kg (141 lb 1.5 oz) 63.277 kg (139 lb 8 oz)    SpO2:  96%       General:  Caucasian female, mild pallor, NAD  Eyes: PERRL, anicteric, noninjected  ENT: Nares and OP nonerythematous, no exudate or plaques.  MMM.  Neck: Supple. No TM.  Lymph: No submandibular, cervical, or supraclavicular LAD.  Cardiovascular: RRR. 1/6 murmur at LLSB.  No rubs or gallops.  2+ pulses.  Mildly cool extremities.  Less than 2 second cap refill.  Respiratory: CTA bilaterally.  No increased WOB.  Abdomen: Normal active bowel sounds.  Soft, ND/NT.  No organomegaly.  Skin: No rash or petechiae.  No ulcer.  Musculoskeletal: Normal tone and bulk.  No LE edema.  Psychiatric: A&O x 4.  Normal affect.  Neurologic: CN 3-12 intact.  Strength 5/5 and sensation intact to light touch throughout.  No dysmetria.  Labs on Admission:  Basic Metabolic Panel:  Lab 07/12/12 1610  NA 135  K 3.7  CL 100  CO2 24  GLUCOSE 119*  BUN 38*  CREATININE 1.27*  CALCIUM 8.5  MG --  PHOS --   Liver Function Tests:  Lab 07/12/12 1127  AST 50*  ALT 19  ALKPHOS 90  BILITOT 0.2*  PROT 6.7  ALBUMIN 3.0*    No results found for this basename: LIPASE:5,AMYLASE:5 in the last 168 hours No results found for this basename: AMMONIA:5 in the last 168 hours CBC:  Lab 07/12/12 1532 07/12/12 1127  WBC 14.1* 17.7*  NEUTROABS -- 15.2*  HGB 8.3* 9.0*  HCT 25.4* 27.6*  MCV 81.9 81.7  PLT 425* 454*   Cardiac Enzymes: No results found for this basename: CKTOTAL:5,CKMB:5,CKMBINDEX:5,TROPONINI:5 in the last 168 hours  BNP (last 3 results)  Basename 11/21/11 1040  PROBNP 140.0*   CBG: No results found for this basename: GLUCAP:5 in the last 168 hours  Radiological Exams on Admission: Dg Chest Port 1 View  07/12/2012  *RADIOLOGY REPORT*  Clinical Data: Hematemesis.  Chest pain.  Current history of hypertension and bronchiectasis.  PORTABLE CHEST - 1 VIEW 07/12/2012 1143 hours:  Comparison: One-view chest x-ray 06/21/2012.  Two-view chest x-ray 05/14/2012, 11/21/2011.  Findings: Prior sternotomy.  Cardiac silhouette enlarged but stable.  Thoracic aorta atherosclerotic, unchanged.  Prominent central pulmonary arteries, unchanged.  Emphysematous changes in the upper lobes, unchanged.  Pulmonary fibrosis bilaterally, right greater than left, unchanged.  No new pulmonary parenchymal abnormalities.  IMPRESSION: Stable cardiomegaly without pulmonary edema.  Stable COPD/emphysema and pulmonary fibrosis.  No acute cardiopulmonary disease.   Original Report Authenticated By: Hulan Saas, M.D.     Assessment/Plan Principal Problem:  *GI bleed Active Problems:  CAD (coronary artery disease)  Hypertension  Hyperlipidemia  GERD (gastroesophageal reflux disease)  Acute blood loss anemia  Acute kidney injury  Leukocytosis  GI bleed:  Likely due to ulcerative esophagitis.  Patient's HGB has trended down slightly since admission, but she has remained hemodynamically stable. - Admit to stepdown and consider to tele if hemodynamically stable overnight. - Clear liquid diet. - Protonix gtt.   -  Continue  sucralfate. - Appreciate GI recommendations. - GI considering EGD on Monday or sooner if ongoing bleeding.  HTN:  At risk for hypotension in the setting of GI bleed. - Continue amlodipine, clonidine, and metoprolol. - Hold Lasix and valsartan.  AKI:  Likely due to mild hypovolemia from poor PO intake and GI bleed.  Baseline creatinine 0.9-1. - IV fluids. - Repeat BMP in AM.  CAD:  Stable.  Patient is not on ASA or Plavix secondary to recurrent UGI bleed. - Continue BB. - Holding ARB.  Leukocytosis:  Probably due to GI bleed.  CXR neg. - UA pending.  Acute blood loss anemia:  Due to GI bleed. - Transfuse for HGB < 7. - CBC at 2100 and in AM.  Diet:  Clear liquids. Access:  PIV. IVF:  NS @ 75 ml/hr x 15 hours. Proph:  SCD's  Code Status: DNR Family Communication: Spoke with patient, daughter and son-in-law.  Emergency contact: Larey Seat (daughter): [phone] or Raynelle Dick (son-in-law): [phone] Disposition Plan: The family requests that the patient be placed in SNF again at time of d/c.  Time spent: 60 minutes  Renae Fickle [facility] Pager [phone]  If 7PM-7AM, please contact night-coverage [email or web address] Password [facility] 07/12/2012, 8:37 PM

## 2012-07-12 NOTE — ED Notes (Signed)
Second nurse attempted to place NGT, unable to place tube.

## 2012-07-12 NOTE — ED Notes (Signed)
Pt reports vomiting blood beginning this morning.  EMS reports that approximately of bloody vomit was in the pt's room.  Pt lives with her daughter.  Pt reports hx of GI bleed and requiring blood transfusions.  EMS gave 4 mg Zofran en route along with of NS.  Pt presents with VSS, but brown/bloody vomit on gown, legs, and slippers.

## 2012-07-12 NOTE — ED Provider Notes (Addendum)
History     CSN: [id number]  Arrival date & time 07/12/12  1046   First MD Initiated Contact with Patient 07/12/12 1057      Chief Complaint  Patient presents with  . Hematemesis    (Consider location/radiation/quality/duration/timing/severity/associated sxs/prior treatment) Patient is a 77 y.o. female presenting with vomiting. The history is provided by the patient and a caregiver.  Emesis  This is a new problem. The current episode started 6 to 12 hours ago. Episode frequency: 2 times. The problem has not changed since onset.The emesis has an appearance of bright red blood. There has been no fever. Associated symptoms include diarrhea. Pertinent negatives include no cough, no fever and no URI. Associated symptoms comments: Last night started with black liquid stools. Risk factors: prior hx of similar 2 weeks ago with ulcerative esophagitis.    Past Medical History  Diagnosis Date  . Hypertension   . Hyperlipidemia   . OSA (obstructive sleep apnea)     not on CPAP  . Bronchiectasis   . Aortic stenosis   . CAD (coronary artery disease)     Present PCI and MI  . GERD (gastroesophageal reflux disease)   . PUD (peptic ulcer disease)   . Legally blind   . Scleroderma   . Blood transfusion without reported diagnosis     Past Surgical History  Procedure Date  . Cardiac valve replacement 2011  . Coronary artery bypass graft 2011  . Left shoulder surgery   . Corneal transplant   . Cholecystectomy   . Appendectomy   . Abdominal hysterectomy   . Total knee arthroplasty   . Esophagogastroduodenoscopy 06/23/2012    Procedure: ESOPHAGOGASTRODUODENOSCOPY (EGD);  Surgeon: Petra Kuba, MD;  Location: [facility] END[facility];  Service: Endoscopy;  Laterality: N/A;    Family History  Problem Relation Age of Onset  . Heart disease Sister   . Heart disease Brother     MI x 2  . Lung disease Brother     ? dz, was a smoker    History  Substance Use Topics  . Smoking status: Never Smoker    . Smokeless tobacco: Never Used  . Alcohol Use: No    OB History    Grav Para Term Preterm Abortions TAB SAB Ect Mult Living                  Review of Systems  Constitutional: Negative for fever.  Respiratory: Negative for cough and shortness of breath.   Gastrointestinal: Positive for vomiting and diarrhea.  All other systems reviewed and are negative.    Allergies  Levofloxacin  Home Medications   Current Outpatient Rx  Name  Route  Sig  Dispense  Refill  . AMITRIPTYLINE HCL 50 MG PO TABS   Oral   Take 50 mg by mouth at bedtime.         Marland Kitchen AMLODIPINE BESYLATE 5 MG PO TABS   Oral   Take 1 tablet (5 mg total) by mouth daily.   90 tablet   0   . CLONIDINE HCL 0.1 MG/24HR TD PTWK   Transdermal   Place 1 patch onto the skin once a week. Thursday         . FUROSEMIDE 40 MG PO TABS   Oral   Take 1 tablet (40 mg total) by mouth daily.   30 tablet   0   . IPRATROPIUM-ALBUTEROL 0.5-2.5 (3) MG/3ML IN SOLN   Nebulization   Take 3 mLs by nebulization  every 4 (four) hours as needed.   360 mL   11   . MELATONIN 10 MG PO TABS   Oral   Take 1 tablet by mouth at bedtime.         Marland Kitchen METOPROLOL SUCCINATE ER 100 MG PO TB24   Oral   Take 1 tablet (100 mg total) by mouth daily. Take with or immediately following a meal.   90 tablet   0   . ONDANSETRON 4 MG PO TBDP   Oral   Take 1 tablet (4 mg total) by mouth every 8 (eight) hours as needed for nausea.   10 tablet   0   . PANTOPRAZOLE SODIUM 40 MG PO TBEC   Oral   Take 1 tablet (40 mg total) by mouth 2 (two) times daily before a meal.         . PREDNISOLONE ACETATE 1 % OP SUSP   Both Eyes   Place 1 drop into both eyes daily.   5 mL   4   . SUCRALFATE 1 GM/10ML PO SUSP   Oral   Take 10 mLs (1 g total) by mouth 4 (four) times daily -  with meals and at bedtime.   420 mL      . TOPIRAMATE 25 MG PO TABS   Oral   Take 1 tablet (25 mg total) by mouth at bedtime.   90 tablet   0   . TRAMADOL HCL  50 MG PO TABS   Oral   Take 1 tablet (50 mg total) by mouth every 8 (eight) hours as needed.   30 tablet   0   . VALSARTAN 320 MG PO TABS   Oral   Take 1 tablet (320 mg total) by mouth daily.   90 tablet   4   . RESOURCE BREEZE PO LIQD   Oral   Take 1 Container by mouth 3 (three) times daily between meals.           BP 121/51  Pulse 82  Temp 98.7 F (37.1 C) (Oral)  Resp 20  SpO2 91%  Physical Exam  Nursing note and vitals reviewed. Constitutional: She is oriented to person, place, and time. She appears well-developed and well-nourished. No distress.  HENT:  Head: Normocephalic and atraumatic.  Mouth/Throat: Oropharynx is clear and moist.       Dried blood around the mouth  Eyes: Conjunctivae normal and EOM are normal. Pupils are equal, round, and reactive to light.  Neck: Normal range of motion. Neck supple.  Cardiovascular: Normal rate, regular rhythm and intact distal pulses.   No murmur heard. Pulmonary/Chest: Effort normal and breath sounds normal. No respiratory distress. She has no wheezes. She has no rales.  Abdominal: Soft. Normal appearance. She exhibits no distension. There is tenderness in the epigastric area. There is no rebound and no guarding.  Musculoskeletal: Normal range of motion. She exhibits no edema and no tenderness.  Neurological: She is alert and oriented to person, place, and time.  Skin: Skin is warm and dry. No rash noted. No erythema. There is pallor.  Psychiatric: She has a normal mood and affect. Her behavior is normal.    ED Course  Procedures (including critical care time)  Labs Reviewed  CBC WITH DIFFERENTIAL - Abnormal; Notable for the following:    WBC 17.7 (*)     RBC 3.38 (*)     Hemoglobin 9.0 (*)     HCT 27.6 (*)  Platelets 454 (*)     Neutrophils Relative 86 (*)     Neutro Abs 15.2 (*)     Lymphocytes Relative 9 (*)     All other components within normal limits  COMPREHENSIVE METABOLIC PANEL - Abnormal; Notable  for the following:    Glucose, Bld 119 (*)     BUN 38 (*)     Creatinine, Ser 1.27 (*)     Albumin 3.0 (*)     AST 50 (*)     Total Bilirubin 0.2 (*)     GFR calc non Af Amer 36 (*)     GFR calc Af Amer 41 (*)     All other components within normal limits  PROTIME-INR  APTT  TYPE AND SCREEN   Dg Chest Port 1 View  07/12/2012  *RADIOLOGY REPORT*  Clinical Data: Hematemesis.  Chest pain.  Current history of hypertension and bronchiectasis.  PORTABLE CHEST - 1 VIEW 07/12/2012 1143 hours:  Comparison: One-view chest x-ray 06/21/2012.  Two-view chest x-ray 05/14/2012, 11/21/2011.  Findings: Prior sternotomy.  Cardiac silhouette enlarged but stable.  Thoracic aorta atherosclerotic, unchanged.  Prominent central pulmonary arteries, unchanged.  Emphysematous changes in the upper lobes, unchanged.  Pulmonary fibrosis bilaterally, right greater than left, unchanged.  No new pulmonary parenchymal abnormalities.  IMPRESSION: Stable cardiomegaly without pulmonary edema.  Stable COPD/emphysema and pulmonary fibrosis.  No acute cardiopulmonary disease.   Original Report Authenticated By: Hulan Saas, M.D.      1. Upper GI bleeding       MDM   Patient with complaint of upper GI bleeding today. She had 2 episodes of grossly bloody emesis today. Last night she developed black liquid stools. Patient was just recently admitted for upper GI bleeding thought most likely to be from distal ulcerative esophagitis.  Patient is awake and alert in exam and is complaining of some mild epigastric tenderness. Family took her home from the nursing home yesterday and symptoms happened last night. She is not on any anticoagulation at this time. While she was hospitalized she did not require blood transfusion.  Will place NG tube to evaluate for any active bleeding. Chest x-ray is stable. CBC was stable hemoglobin of 9 currently and a new leukocytosis of 17,000. Patient started on protonix bolus and drip.  12:49  PM Unable to place NGT due to resistance.  Will discuss with GI.  No bloody emesis here.  GI wanted to continue protonix and admit for obs.      Gwyneth Sprout, MD 07/12/12 1249  Gwyneth Sprout, MD 07/12/12 1250  Gwyneth Sprout, MD 07/12/12 1306  Gwyneth Sprout, MD 07/12/12 1408

## 2012-07-13 DIAGNOSIS — J479 Bronchiectasis, uncomplicated: Secondary | ICD-10-CM

## 2012-07-13 LAB — BASIC METABOLIC PANEL
Calcium: 8.4 mg/dL (ref 8.4–10.5)
Creatinine, Ser: 1.22 mg/dL — ABNORMAL HIGH (ref 0.50–1.10)
GFR calc non Af Amer: 38 mL/min — ABNORMAL LOW (ref >90)
Sodium: 139 mEq/L (ref 135–145)

## 2012-07-13 LAB — CBC
MCH: 27.1 pg (ref 26.0–34.0)
MCHC: 32.6 g/dL (ref 30.0–36.0)
MCV: 83.2 fL (ref 78.0–100.0)
Platelets: 314 10*3/uL (ref 150–400)
RDW: 14.4 % (ref 11.5–15.5)

## 2012-07-13 LAB — PRO B NATRIURETIC PEPTIDE: Pro B Natriuretic peptide (BNP): 4182 pg/mL — ABNORMAL HIGH (ref 0–450)

## 2012-07-13 LAB — PREPARE RBC (CROSSMATCH)

## 2012-07-13 MED ORDER — DEXTROSE 5 % IV SOLN
1.0000 g | INTRAVENOUS | Status: AC
  Administered 2012-07-13 – 2012-07-19 (×7): 1 g via INTRAVENOUS
  Filled 2012-07-13 (×8): qty 10

## 2012-07-13 MED ORDER — SODIUM CHLORIDE 0.9 % IV SOLN: INTRAVENOUS | Status: DC

## 2012-07-13 NOTE — Progress Notes (Signed)
Patient being transferred to 5500 per MD order. Report called to Zella Ball, RN. Family aware of transfer and new rioom

## 2012-07-13 NOTE — Progress Notes (Signed)
Subjective: No noted vomiting or hematemesis overnight.  Objective: Vital signs in last 24 hours: Temp:  [98 F (36.7 C)-99.3 F (37.4 C)] 98.1 F (36.7 C) (12/22 1100) Pulse Rate:  [68-86] 69  (12/22 1035) Resp:  [18-20] 20  (12/22 0000) BP: (95-140)/(41-67) 119/53 mmHg (12/22 1100) SpO2:  [93 %-99 %] 97 % (12/22 0000) Weight:  [63.277 kg (139 lb 8 oz)-64 kg (141 lb 1.5 oz)] 63.277 kg (139 lb 8 oz) (12/21 1540) Weight change:     PE: GEN:  NAD ABD:  Soft  Lab Results: CBC    Component Value Date/Time   WBC 11.3* 07/13/2012 0530   RBC 2.73* 07/13/2012 0530   HGB 7.4* 07/13/2012 0530   HCT 22.7* 07/13/2012 0530   PLT 314 07/13/2012 0530   MCV 83.2 07/13/2012 0530   MCH 27.1 07/13/2012 0530   MCHC 32.6 07/13/2012 0530   RDW 14.4 07/13/2012 0530   LYMPHSABS 1.6 07/12/2012 1127   MONOABS 0.8 07/12/2012 1127   EOSABS 0.0 07/12/2012 1127   BASOSABS 0.1 07/12/2012 1127   Assessment:  1.  Bright red hematemesis, none since admission. 2.  History ulcerative esophagitis.  Plan:  1.  Clear liquid diet. 2.  Protonix and carafate. 3.  Endoscopy tomorrow to reassess. 4.  Will follow.   [name] 07/13/2012, 11:49 AM

## 2012-07-13 NOTE — Progress Notes (Signed)
[facility] PROGRESS NOTE  [name] OZH:[id number] DOB: [birth year] DOA: 07/12/2012 PCP: Tandy Gaw, PA-C  Assessment/Plan:  GI bleed: Likely due to ulcerative esophagitis. Patient's HGB has trended down slightly since admission, but she has remained hemodynamically stable. No melena or hematemesis -  Transfuse 2 units -  Transfer to telemetry -  CLD -  Protonix and carafate -  EGD tomorrow -  Appreciate GI recommendations  HTN:  Blood pressures normal to mildly hypotensive while holding lasix and valsartan - Continue amlodipine, clonidine, and metoprolol.  - Hold Lasix and valsartan.  -  Anticipate improvement with blood transfusion  AKI: Likely due to mild hypovolemia from poor PO intake and GI bleed. Baseline creatinine 0.9-1.  Improved with IVF. - D/C IV fluids as getting blood transfusion - Repeat BMP in AM.   SOB:  May be due to IVF, but may also be due to obstructive lung disease -  BNP -  Consider lasix if worsens  UTI:  LE + and TNTC WBC on UA -  Ceftriaxone  CAD: Stable. Patient is not on ASA or Plavix secondary to recurrent UGI bleed.   - Continue BB.  - Holding ARB.   Leukocytosis: May be due to GIB versus UTI. CXR neg. Resolving  Acute blood loss anemia: Due to GI bleed.  - Transfuse 2 units  -  Repeat CBC post transfusion and in AM  Diet: Clear liquids.  Access: PIV.  IVF:  D/c   Proph: SCD's   Code Status: DNR  Family Communication: Spoke with patient alone today. Emergency contact: Larey Seat (daughter): [phone] or Raynelle Dick (son-in-law): [phone]  Disposition Plan: The family requests that the patient be placed in SNF again at time of d/c.    Consultants:  GI, Dr. Dulce Sellar  Procedures:  None  Antibiotics:  Ceftriaxone 12/22 >>   HPI/Subjective:  Patient states that she feels worn out today and severely fatigued.  Denies chest pain, shortness of breath.  She had some nausea that improved with zofran.  She has been  tolerating her CLD without problem.  Denies BM in the last 24 hours and denies dysuria, urinary frequency or urgency.  She is incontinent of urine.    Objective: Filed Vitals:   07/13/12 1035 07/13/12 1045 07/13/12 1100 07/13/12 1200  BP: 116/53 114/46 119/53 133/60  Pulse: 69     Temp:  98.2 F (36.8 C) 98.1 F (36.7 C) 98.2 F (36.8 C)  TempSrc:  Oral Oral Oral  Resp:      Height:      Weight:      SpO2:        Intake/Output Summary (Last 24 hours) at 07/13/12 1351 Last data filed at 07/13/12 1300  Gross per 24 hour  Intake 2313.42 ml  Output   1025 ml  Net 1288.42 ml   Filed Weights   07/12/12 1500 07/12/12 1540  Weight: 64 kg (141 lb 1.5 oz) 63.277 kg (139 lb 8 oz)    Exam:  General: Caucasian female, pallor, NAD  ENT:  MMM.  Cardiovascular: RRR. 1/6 murmur at LLSB. No rubs or gallops. 2+ pulses. Mildly cool extremities. Less than 2 second cap refill.  Respiratory:  Mild wheeze today on exam without rales or rhonchi.    Abdomen: Normal active bowel sounds. Soft, ND.  Mild TTP in the LLQ and diffusely across with mid upper abdomen.  No rebound or guarding. No organomegaly.  Musculoskeletal: Normal tone and bulk. No LE edema.  Data Reviewed: Basic Metabolic Panel:  Lab 07/13/12 1610 07/12/12 1127  NA 139 135  K 4.5 3.7  CL 108 100  CO2 24 24  GLUCOSE 100* 119*  BUN 26* 38*  CREATININE 1.22* 1.27*  CALCIUM 8.4 8.5  MG -- --  PHOS -- --   Liver Function Tests:  Lab 07/12/12 1127  AST 50*  ALT 19  ALKPHOS 90  BILITOT 0.2*  PROT 6.7  ALBUMIN 3.0*   No results found for this basename: LIPASE:5,AMYLASE:5 in the last 168 hours No results found for this basename: AMMONIA:5 in the last 168 hours CBC:  Lab 07/13/12 0530 07/12/12 2109 07/12/12 1532 07/12/12 1127  WBC 11.3* 17.3* 14.1* 17.7*  NEUTROABS -- -- -- 15.2*  HGB 7.4* 8.2* 8.3* 9.0*  HCT 22.7* 25.1* 25.4* 27.6*  MCV 83.2 82.6 81.9 81.7  PLT 314 425* 425* 454*   Cardiac Enzymes: No results  found for this basename: CKTOTAL:5,CKMB:5,CKMBINDEX:5,TROPONINI:5 in the last 168 hours BNP (last 3 results)  Basename 11/21/11 1040  PROBNP 140.0*   CBG: No results found for this basename: GLUCAP:5 in the last 168 hours  Recent Results (from the past 240 hour(s))  MRSA PCR SCREENING     Status: Normal   Collection Time   07/12/12  4:12 PM      Component Value Range Status Comment   MRSA by PCR NEGATIVE  NEGATIVE Final      Studies: Dg Chest Port 1 View  07/12/2012  *RADIOLOGY REPORT*  Clinical Data: Hematemesis.  Chest pain.  Current history of hypertension and bronchiectasis.  PORTABLE CHEST - 1 VIEW 07/12/2012 1143 hours:  Comparison: One-view chest x-ray 06/21/2012.  Two-view chest x-ray 05/14/2012, 11/21/2011.  Findings: Prior sternotomy.  Cardiac silhouette enlarged but stable.  Thoracic aorta atherosclerotic, unchanged.  Prominent central pulmonary arteries, unchanged.  Emphysematous changes in the upper lobes, unchanged.  Pulmonary fibrosis bilaterally, right greater than left, unchanged.  No new pulmonary parenchymal abnormalities.  IMPRESSION: Stable cardiomegaly without pulmonary edema.  Stable COPD/emphysema and pulmonary fibrosis.  No acute cardiopulmonary disease.   Original Report Authenticated By: Hulan Saas, M.D.     Scheduled Meds:   . amitriptyline  50 mg Oral QHS  . amLODipine  5 mg Oral Daily  . cloNIDine  0.1 mg Transdermal Weekly  . metoprolol succinate  100 mg Oral Daily  . prednisoLONE acetate  1 drop Both Eyes Daily  . sodium chloride  3 mL Intravenous Q12H  . sucralfate  1 g Oral TID WC & HS  . topiramate  25 mg Oral QHS   Continuous Infusions:   . sodium chloride 75 mL/hr at 07/12/12 2300  . pantoprozole (PROTONIX) infusion 8 mg/hr (07/13/12 1300)    Principal Problem:  *GI bleed Active Problems:  CAD (coronary artery disease)  Hypertension  Hyperlipidemia  GERD (gastroesophageal reflux disease)  Acute blood loss anemia  Acute kidney  injury  Leukocytosis    Time spent: 30    [name], [facility]  [facility] Pager [phone]. If 8PM-8AM, please contact night-coverage at [email or web address], password [facility] 07/13/2012, 1:51 PM  LOS: 1 day

## 2012-07-13 NOTE — Plan of Care (Signed)
Problem: Phase I Progression Outcomes Goal: Voiding-avoid urinary catheter unless indicated Outcome: Completed/Met Date Met:  07/13/12 Patient uses bedpan

## 2012-07-14 ENCOUNTER — Encounter (HOSPITAL_COMMUNITY)
Admission: EM | Disposition: A | Discharge status: Skilled Nursing Facility | Payer: Self-pay | Source: Home / Self Care | Attending: Family Medicine

## 2012-07-14 ENCOUNTER — Encounter (HOSPITAL_COMMUNITY): Payer: Self-pay

## 2012-07-14 HISTORY — PX: ESOPHAGOGASTRODUODENOSCOPY: SHX5428

## 2012-07-14 LAB — TYPE AND SCREEN
ABO/RH(D): O POS
Antibody Screen: NEGATIVE
Unit division: 0

## 2012-07-14 LAB — BASIC METABOLIC PANEL
Chloride: 107 mEq/L (ref 96–112)
Creatinine, Ser: 1.08 mg/dL (ref 0.50–1.10)
GFR calc Af Amer: 50 mL/min — ABNORMAL LOW (ref >90)
Potassium: 3.6 mEq/L (ref 3.5–5.1)
Sodium: 139 mEq/L (ref 135–145)

## 2012-07-14 LAB — CBC
MCV: 85.1 fL (ref 78.0–100.0)
Platelets: 296 10*3/uL (ref 150–400)
RDW: 14.9 % (ref 11.5–15.5)
WBC: 9.9 10*3/uL (ref 4.0–10.5)

## 2012-07-14 SURGERY — EGD (ESOPHAGOGASTRODUODENOSCOPY)
Anesthesia: Moderate Sedation | Laterality: Left

## 2012-07-14 MED ORDER — MIDAZOLAM HCL 10 MG/2ML IJ SOLN
INTRAMUSCULAR | Status: DC | PRN
  Administered 2012-07-14 (×3): 1 mg via INTRAVENOUS

## 2012-07-14 MED ORDER — FUROSEMIDE 10 MG/ML IJ SOLN
40.0000 mg | Freq: Once | INTRAMUSCULAR | Status: AC
  Administered 2012-07-14: 40 mg via INTRAVENOUS
  Filled 2012-07-14: qty 4

## 2012-07-14 MED ORDER — FUROSEMIDE 40 MG PO TABS
40.0000 mg | ORAL_TABLET | Freq: Every day | ORAL | Status: DC
  Administered 2012-07-14 – 2012-07-21 (×8): 40 mg via ORAL
  Filled 2012-07-14 (×8): qty 1

## 2012-07-14 MED ORDER — FENTANYL CITRATE 0.05 MG/ML IJ SOLN
INTRAMUSCULAR | Status: AC
  Filled 2012-07-14: qty 4

## 2012-07-14 MED ORDER — POLYETHYLENE GLYCOL 3350 17 G PO PACK
17.0000 g | PACK | Freq: Every day | ORAL | Status: DC
  Administered 2012-07-14 – 2012-07-21 (×6): 17 g via ORAL
  Filled 2012-07-14 (×8): qty 1

## 2012-07-14 MED ORDER — ENSURE COMPLETE PO LIQD
237.0000 mL | Freq: Two times a day (BID) | ORAL | Status: DC
  Administered 2012-07-14 – 2012-07-20 (×9): 237 mL via ORAL

## 2012-07-14 MED ORDER — METOCLOPRAMIDE HCL 5 MG PO TABS
5.0000 mg | ORAL_TABLET | Freq: Three times a day (TID) | ORAL | Status: DC
  Administered 2012-07-14 – 2012-07-20 (×22): 5 mg via ORAL
  Filled 2012-07-14 (×29): qty 1

## 2012-07-14 MED ORDER — IRBESARTAN 300 MG PO TABS
300.0000 mg | ORAL_TABLET | Freq: Every day | ORAL | Status: DC
  Administered 2012-07-15 – 2012-07-21 (×7): 300 mg via ORAL
  Filled 2012-07-14 (×7): qty 1

## 2012-07-14 MED ORDER — MIDAZOLAM HCL 5 MG/ML IJ SOLN
INTRAMUSCULAR | Status: AC
  Filled 2012-07-14: qty 3

## 2012-07-14 NOTE — Progress Notes (Signed)
Addendum to earlier note: I have started the patient on low-dose metoclopramide, with the hope that it will facilitate gastric emptying and prevent reflux as adjunctive therapy for her esophagitis. We will need to watch for side effects from this medication, such as mental status changes, Parkinsonian symptoms, tremor, sleep disturbance.  Florencia Reasons, M.D. [phone]

## 2012-07-14 NOTE — Progress Notes (Signed)
Clinical Social Work Department BRIEF PSYCHOSOCIAL ASSESSMENT 07/14/2012  Patient:  [name], [name]     Account Number:  [id number]     Admit date:  07/12/2012  Clinical Social Worker:  Dennison Bulla  Date/Time:  07/14/2012 04:30 PM  Referred by:  Physician  Date Referred:  07/14/2012 Referred for  SNF Placement   Other Referral:   Interview type:  Patient Other interview type:    PSYCHOSOCIAL DATA Living Status:  FAMILY Admitted from facility:   Level of care:   Primary support name:  Toniann Fail Primary support relationship to patient:  CHILD, ADULT Degree of support available:   Strong    CURRENT CONCERNS Current Concerns  Post-Acute Placement   Other Concerns:    SOCIAL WORK ASSESSMENT / PLAN CSW received referral to assist with dc planning. CSW reviewed chart and spoke with patient. CSW spoke with dtr via phone.    CSW introduced myself and explained role. Patient was at [facility], dc to home and was there a day before returning to the hospital. Dtr works during the day and is interested in SNF placement. CSW placed SNF list in room with patient for dtr to review. Dtr interested in [facility] search at this time. CSW encouraged dtr to apply for Medicaid if patient needed LT SNF. Dtr agreeable.    CSW completed FL2 and faxed out. PT/OT notes needed for authorization prior to dc.   Assessment/plan status:  Psychosocial Support/Ongoing Assessment of Needs Other assessment/ plan:   Information/referral to community resources:   SNF list  Referral to Department of Social Services for [facility]    PATIENT'S/FAMILY'S RESPONSE TO PLAN OF CARE: Patient alert and oriented. Patient and dtr appreciative of CSW consult and agreeable to SNF.

## 2012-07-14 NOTE — Progress Notes (Signed)
The patient's endoscopy shows significant erosive esophagitis, without active bleeding. It is quite a bit more extensive than was noted on her endoscopy 3 weeks ago. This might account for her recent episode of hematemesis.  In my experience, this picture is often seen in patients who are bedridden. Although high-dose PPI therapy can be helpful in this setting, probably even more important is maintaining the patient in a head upright position, to help prevent, mechanically, reflux of gastric contents.  Recommendation:  1. Continue continuous PPI infusion for the next several days 2. Maintain bed in reverse Trendelenburg position with head of bed elevated 30 at all times. 3. Try to make sure that the patient is sitting upright or in a chair for a couple of hours after each meal, to the degree possible.  Florencia Reasons, M.D. [phone]

## 2012-07-14 NOTE — Progress Notes (Signed)
INITIAL NUTRITION ASSESSMENT  DOCUMENTATION CODES Per approved criteria  -Not Applicable   INTERVENTION: Ensure Complete po BID, each supplement provides 350 kcal and 13 grams of protein.  NUTRITION DIAGNOSIS: Inadequate oral intake  related to decreased appetite as evidenced by meal completion < 50%.   Goal: Pt to meet >/= 90% of their estimated nutrition needs.  Monitor:  PO intake, supplement acceptance  Reason for Assessment: MST  78 y.o. female  Admitting Dx: GI bleed  ASSESSMENT: Pt admitted with GI bleed, EGD demonstrates worsening of erosive esophagitis. GI following.  Per pt she has lost 6% of her body weight in 6+ months. Pt reports fair appetite during that time. Pt has not really ate much x 5 days due to current illness. Seen lunch tray in pt's room, pt had only consumed dessert. Pt reports that she has no appetite, but does like ensure and is willing to drink. Pt is at high risk for malnutrition due to recent weight loss and poor appetite.    Height: Ht Readings from Last 1 Encounters:  07/13/12 5' (1.524 m)    Weight: Wt Readings from Last 1 Encounters:  07/13/12 145 lb 11.6 oz (66.1 kg)    Ideal Body Weight: 45.4 kg  % Ideal Body Weight: 146%  Wt Readings from Last 10 Encounters:  07/13/12 145 lb 11.6 oz (66.1 kg)  07/13/12 145 lb 11.6 oz (66.1 kg)  06/24/12 141 lb 1.5 oz (64 kg)  06/24/12 141 lb 1.5 oz (64 kg)  06/14/12 141 lb (63.957 kg)  05/14/12 145 lb (65.772 kg)  04/30/12 149 lb (67.586 kg)  04/09/12 146 lb (66.225 kg)  03/26/12 150 lb (68.04 kg)  03/26/12 150 lb (68.04 kg)    Usual Body Weight: 154 lb   % Usual Body Weight: 94%  BMI:  Body mass index is 28.46 kg/(m^2). - overweight  Estimated Nutritional Needs: Kcal: 1300-1450 Protein: 65-75 grams Fluid: >1.5 L/day  Skin: no issues noted  Nutrition Focused Physical Exam: Subcutaneous Fat:  Orbital Region: WNL Upper Arm Region: WNL Thoracic and Lumbar Region: WNL  Muscle:   Temple Region: wasted Clavicle Bone Region: WNL Clavicle and Acromion Bone Region: WNL Scapular Bone Region: NA Dorsal Hand: wasted Patellar Region: WNL Anterior Thigh Region: WNL Posterior Calf Region: WNL  Edema: NA  Diet Order: Cardiac Lunch: only dessert  EDUCATION NEEDS: -No education needs identified at this time   Intake/Output Summary (Last 24 hours) at 07/14/12 1521 Last data filed at 07/14/12 1300  Gross per 24 hour  Intake   1134 ml  Output    500 ml  Net    634 ml    Last BM: PTA   Labs:   Lab 07/14/12 0602 07/13/12 0530 07/12/12 1127  NA 139 139 135  K 3.6 4.5 3.7  CL 107 108 100  CO2 24 24 24   BUN 15 26* 38*  CREATININE 1.08 1.22* 1.27*  CALCIUM 8.6 8.4 8.5  MG -- -- --  PHOS -- -- --  GLUCOSE 99 100* 119*    CBG (last 3)  No results found for this basename: GLUCAP:3 in the last 72 hours  Scheduled Meds:   . amitriptyline  50 mg Oral QHS  . amLODipine  5 mg Oral Daily  . cefTRIAXone (ROCEPHIN)  IV  1 g Intravenous Q24H  . cloNIDine  0.1 mg Transdermal Weekly  . furosemide  40 mg Oral Daily  . irbesartan  300 mg Oral Daily  . metoCLOPramide  5  mg Oral TID AC & HS  . metoprolol succinate  100 mg Oral Daily  . polyethylene glycol  17 g Oral Daily  . prednisoLONE acetate  1 drop Both Eyes Daily  . sodium chloride  3 mL Intravenous Q12H  . sucralfate  1 g Oral TID WC & HS  . topiramate  25 mg Oral QHS    Continuous Infusions:   . pantoprozole (PROTONIX) infusion 8 mg/hr (07/14/12 0315)    Past Medical History  Diagnosis Date  . Hypertension   . Hyperlipidemia   . OSA (obstructive sleep apnea)     not on CPAP  . Bronchiectasis   . Aortic stenosis     s/p AV replacement with pig valve  . CAD (coronary artery disease)     Present PCI and MI  . GERD (gastroesophageal reflux disease)   . PUD (peptic ulcer disease)   . Legally blind   . Scleroderma     Caused hemoptysis about 3 years ago  . Blood transfusion without reported  diagnosis   . Psoriasis   . Hypothyroidism     hx of, but not currently and not on medications  . Osteoarthritis   . Pneumonia     Past Surgical History  Procedure Date  . Cardiac valve replacement 2011  . Coronary artery bypass graft 2011  . Left shoulder surgery  many years ago    arthoscopy at [facility] twice many years ago  . Corneal transplant 2003  . Cholecystectomy 2007  . Appendectomy   . Abdominal hysterectomy 1940  . Total knee arthroplasty 2000    left knee  . Esophagogastroduodenoscopy 06/23/2012    Procedure: ESOPHAGOGASTRODUODENOSCOPY (EGD);  Surgeon: Petra Kuba, MD;  Location: [facility] ENDOSCOPY;  Service: Endoscopy;  Laterality: N/A;    Kendell Bane RD, LDN, CNSC [phone] Pager [phone] After Hours Pager

## 2012-07-14 NOTE — Progress Notes (Signed)
Clinical Social Work Department CLINICAL SOCIAL WORK PLACEMENT NOTE 07/14/2012  Patient:  [name], [name]  Account Number:  [id number] Admit date:  07/12/2012  Clinical Social Worker:  Unk Lightning, LCSW  Date/time:  07/14/2012 04:30 PM  Clinical Social Work is seeking post-discharge placement for this patient at the following level of care:   SKILLED NURSING   (*CSW will update this form in Epic as items are completed)   07/14/2012  Patient/family provided with [facility] Department of Clinical Social Work's list of facilities offering this level of care within the geographic area requested by the patient (or if unable, by the patient's family).  07/14/2012  Patient/family informed of their freedom to choose among providers that offer the needed level of care, that participate in Medicare, Medicaid or managed care program needed by the patient, have an available bed and are willing to accept the patient.  07/14/2012  Patient/family informed of [facility]' ownership interest in [facility], as well as of the fact that they are under no obligation to receive care at this facility.  PASARR submitted to EDS on existing # PASARR number received from EDS on   FL2 transmitted to all facilities in geographic area requested by pt/family on  07/14/2012 FL2 transmitted to all facilities within larger geographic area on   Patient informed that his/her managed care company has contracts with or will negotiate with  certain facilities, including the following:     Patient/family informed of bed offers received:   Patient chooses bed at  Physician recommends and patient chooses bed at    Patient to be transferred to  on   Patient to be transferred to facility by   The following physician request were entered in Epic:   Additional Comments:

## 2012-07-14 NOTE — Progress Notes (Addendum)
[facility] PROGRESS NOTE  [name] ZOX:[id number] DOB: [birth year] DOA: 07/12/2012 PCP: Tandy Gaw, PA-C  Assessment/Plan:  GI bleed:  EGD demonstrates worsening of erosive esophagitis.  -  Protonix and carafate -  Appreciate GI recommendations -  Added reglan, will monitor for side effects -  Upright and elevate head of bed  HTN:  Blood pressures normal to mildly hypotensive while holding lasix and valsartan - Continue amlodipine, clonidine, and metoprolol.  - Restart Lasix  -  Restart valsartan tomorrow -  Anticipate improvement with blood transfusion  AKI: Likely due to mild hypovolemia from poor PO intake and GI bleed. Baseline creatinine 0.9-1.  Improved with IVF and transfusion.   - Restart lasix  SOB:  May be due to IVF, but may also be due to obstructive lung disease.  BNP was elevated 4182 -  Restart lasix -  IS  E. Coli UTI:  LE + and TNTC WBC on UA -  Ceftriaxone -  F/u sensitivities  CAD: Stable. Patient is not on ASA or Plavix secondary to recurrent UGI bleed.   - Continue BB.  - Restart ARB in AM  Leukocytosis: May be due to GIB versus UTI. CXR neg. Resolved  Acute blood loss anemia: Due to GI bleed.  - Hemoglobin responded to blood transfusion  Diet: Clear liquids - per GI Access: PIV.  IVF:  D/c   Proph: SCD's   Code Status: DNR  Family Communication: Spoke with patient alone today. Emergency contact: Larey Seat (daughter): [phone] or Raynelle Dick (son-in-law): [phone]  Disposition Plan: The family requests that the patient be placed in SNF again at time of d/c.    Consultants:  GI, Dr. Dulce Sellar  Procedures:  None  Antibiotics:  Ceftriaxone 12/22 >>   HPI/Subjective:  Patient states that she feels worn out today.  Denies chest pain, shortness of breath.  Denies hematemesis. No BM since admission.  Tolerating clear liquids without difficulty.  Has some "esophagus" pain.    Objective: Filed Vitals:   07/14/12 1215  07/14/12 1230 07/14/12 1316 07/14/12 1400  BP: 118/56 124/64 128/70 129/74  Pulse:    70  Temp:    98.6 F (37 C)  TempSrc:    Oral  Resp: 23 19  20   Height:      Weight:      SpO2: 92%   97%    Intake/Output Summary (Last 24 hours) at 07/14/12 1429 Last data filed at 07/14/12 1300  Gross per 24 hour  Intake 1148.17 ml  Output    500 ml  Net 648.17 ml   Filed Weights   07/12/12 1500 07/12/12 1540 07/13/12 1615  Weight: 64 kg (141 lb 1.5 oz) 63.277 kg (139 lb 8 oz) 66.1 kg (145 lb 11.6 oz)    Exam:  General: Caucasian female, NAD  ENT:  MMM.  Cardiovascular: RRR. 1/6 murmur at LLSB. No rubs or gallops. 2+ pulses.  Respiratory:  Full wheeze today on exam with posterior rales, no rhonchi.    Abdomen:  Normal active bowel sounds. Soft, ND, nontender. No organomegaly.  Musculoskeletal: Normal tone and bulk. No LE edema.    Data Reviewed: Basic Metabolic Panel:  Lab 07/14/12 8469 07/13/12 0530 07/12/12 1127  NA 139 139 135  K 3.6 4.5 3.7  CL 107 108 100  CO2 24 24 24   GLUCOSE 99 100* 119*  BUN 15 26* 38*  CREATININE 1.08 1.22* 1.27*  CALCIUM 8.6 8.4 8.5  MG -- -- --  PHOS -- -- --  Liver Function Tests:  Lab 07/12/12 1127  AST 50*  ALT 19  ALKPHOS 90  BILITOT 0.2*  PROT 6.7  ALBUMIN 3.0*   No results found for this basename: LIPASE:5,AMYLASE:5 in the last 168 hours No results found for this basename: AMMONIA:5 in the last 168 hours CBC:  Lab 07/14/12 0602 07/13/12 0530 07/12/12 2109 07/12/12 1532 07/12/12 1127  WBC 9.9 11.3* 17.3* 14.1* 17.7*  NEUTROABS -- -- -- -- 15.2*  HGB 10.5* 7.4* 8.2* 8.3* 9.0*  HCT 31.9* 22.7* 25.1* 25.4* 27.6*  MCV 85.1 83.2 82.6 81.9 81.7  PLT 296 314 425* 425* 454*   Cardiac Enzymes: No results found for this basename: CKTOTAL:5,CKMB:5,CKMBINDEX:5,TROPONINI:5 in the last 168 hours BNP (last 3 results)  Basename 07/13/12 1941 11/21/11 1040  PROBNP 4182.0* 140.0*   CBG: No results found for this basename: GLUCAP:5 in  the last 168 hours  Recent Results (from the past 240 hour(s))  MRSA PCR SCREENING     Status: Normal   Collection Time   07/12/12  4:12 PM      Component Value Range Status Comment   MRSA by PCR NEGATIVE  NEGATIVE Final   URINE CULTURE     Status: Normal (Preliminary result)   Collection Time   07/12/12  9:24 PM      Component Value Range Status Comment   Specimen Description URINE, CLEAN CATCH   Final    Special Requests CX ADDED AT 2144 ON [id number]   Final    Culture  Setup Time 07/13/2012 02:16   Final    Colony Count >=100,000 COLONIES/ML   Final    Culture ESCHERICHIA COLI   Final    Report Status PENDING   Incomplete      Studies: No results found.  Scheduled Meds:    . amitriptyline  50 mg Oral QHS  . amLODipine  5 mg Oral Daily  . cefTRIAXone (ROCEPHIN)  IV  1 g Intravenous Q24H  . cloNIDine  0.1 mg Transdermal Weekly  . metoCLOPramide  5 mg Oral TID AC & HS  . metoprolol succinate  100 mg Oral Daily  . prednisoLONE acetate  1 drop Both Eyes Daily  . sodium chloride  3 mL Intravenous Q12H  . sucralfate  1 g Oral TID WC & HS  . topiramate  25 mg Oral QHS   Continuous Infusions:    . pantoprozole (PROTONIX) infusion 8 mg/hr (07/14/12 0315)    Principal Problem:  *GI bleed Active Problems:  CAD (coronary artery disease)  Hypertension  Hyperlipidemia  GERD (gastroesophageal reflux disease)  Acute blood loss anemia  Acute kidney injury  Leukocytosis    Time spent: 30    Tamma Brigandi, [facility]  [facility] Pager [phone]. If 8PM-8AM, please contact night-coverage at [email or web address], password [facility] 07/14/2012, 2:29 PM  LOS: 2 days

## 2012-07-14 NOTE — Op Note (Signed)
[facility] [address] [place], [address]   ENDOSCOPY PROCEDURE REPORT  PATIENT: [name], [name]  MR#: [id number] BIRTHDATE: January 19, 1919 , 89  yrs. old GENDER: Female ENDOSCOPIST:Bedford Winsor, MD REFERRED BY:  unassigned PROCEDURE DATE:  07/14/2012 PROCEDURE:      esophagogastroduodenoscopy ASA CLASS: INDICATIONS:   hematemesis. Had moderate distal esophagitis on endoscopy about 3 weeks ago, has apparently been on PPI therapy and sucralfate suspension in the interim MEDICATION:    Versed 3 mg IV TOPICAL ANESTHETIC:    none  DESCRIPTION OF PROCEDURE:   the patient was brought from her hospital room to the [facility] cone endoscopy unit and sedated as indicated above. This led to just light sedation, and the patient was coughing and retching somewhat during the course of the procedure. However, overall, she tolerated it reasonably well and there was no clinical instability.  The Pentax adult video endoscope was passed under direct vision. The vocal cords looked grossly normal. However, a good control look of the larynx was not obtained.  The esophagus was entered under direct vision without too much difficulty.  Beginning in the midesophagus, there was patchy and semi-confluent, almost circumferential exudate, without bleeding but with slight friability. No discrete ulcerations, candidal plaques, masses, or stenosis were appreciated. There was a roughly 4 cm hiatal hernia.  The stomach contained no blood or coffee-ground material and had normal mucosa including retroflex view the cardia. The pylorus, duodenal bulb, and second duodenum looked normal.  No biopsies were obtained, because the patient had had biopsies during her recent procedure.     COMPLICATIONS: None  ENDOSCOPIC IMPRESSION:  1. Severe esophagitis, consistent with reflux, progressed in severity compared to 3 weeks ago based on comparison with endoscopic photographs from that  procedure.  2. Moderate sized hiatal hernia 3. No alternative source of recent hematemesis identified  RECOMMENDATIONS:  1. Continue intensive PPI therapy 2. Maintain head of bed elevated 30, preferably in reverse Trendelenburg position so the entire bed sweats rather than just the head of the bed. Maintain upright posture, ideally in a chair, for 2-3 hours after each meal, if tolerated by the patient 3. We will need to consider possible followup endoscopy in a month or so to confirm healing of the esophagitis    _______________________________ eSigned:  Bernette Redbird, MD 07/14/2012 12:16 PM    PATIENT NAME:  [name], [name] MR#: [id number]

## 2012-07-15 ENCOUNTER — Encounter (HOSPITAL_COMMUNITY): Payer: Self-pay | Admitting: Gastroenterology

## 2012-07-15 LAB — URINE CULTURE: Colony Count: >=100000

## 2012-07-15 LAB — CBC
HCT: 32.7 % — ABNORMAL LOW (ref 36.0–46.0)
Hemoglobin: 10.8 g/dL — ABNORMAL LOW (ref 12.0–15.0)
MCH: 27.6 pg (ref 26.0–34.0)
MCHC: 33 g/dL (ref 30.0–36.0)
MCV: 83.4 fL (ref 78.0–100.0)

## 2012-07-15 LAB — BASIC METABOLIC PANEL
BUN: 12 mg/dL (ref 6–23)
Calcium: 8.7 mg/dL (ref 8.4–10.5)
Creatinine, Ser: 1.05 mg/dL (ref 0.50–1.10)
GFR calc non Af Amer: 45 mL/min — ABNORMAL LOW (ref >90)
Glucose, Bld: 102 mg/dL — ABNORMAL HIGH (ref 70–99)
Sodium: 138 mEq/L (ref 135–145)

## 2012-07-15 MED ORDER — POLYETHYLENE GLYCOL 3350 17 G PO PACK
17.0000 g | PACK | Freq: Every day | ORAL | Status: DC | PRN
  Filled 2012-07-15: qty 1

## 2012-07-15 MED ORDER — POTASSIUM CHLORIDE CRYS ER 20 MEQ PO TBCR
40.0000 meq | EXTENDED_RELEASE_TABLET | Freq: Once | ORAL | Status: AC
  Administered 2012-07-15: 40 meq via ORAL

## 2012-07-15 MED ORDER — DOCUSATE SODIUM 100 MG PO CAPS
100.0000 mg | ORAL_CAPSULE | Freq: Two times a day (BID) | ORAL | Status: DC
  Administered 2012-07-15 – 2012-07-21 (×13): 100 mg via ORAL
  Filled 2012-07-15 (×14): qty 1

## 2012-07-15 MED ORDER — BISACODYL 10 MG RE SUPP: 10.0000 mg | Freq: Every day | RECTAL | Status: DC | PRN

## 2012-07-15 MED ORDER — ZOLPIDEM TARTRATE 5 MG PO TABS
5.0000 mg | ORAL_TABLET | Freq: Once | ORAL | Status: AC
  Administered 2012-07-15: 5 mg via ORAL
  Filled 2012-07-15: qty 1

## 2012-07-15 NOTE — Progress Notes (Addendum)
[facility] PROGRESS NOTE  Rajean Desantiago AOZ:[id number] DOB: [birth year] DOA: 07/12/2012 PCP: Tandy Gaw, PA-C  The patient is a 78 yo F with HTN, HLD, OSA, CAD s/p CABG, bioprosthetic aortic valve replacement, GERD, PUD, scleroderma, and recurrent UGI bleeds, who was recently admitted for UGI bleed. In November, she presented with hematemesis and EGD demonstrated ulcerative esophagitis. She was started on BID PPI and QID sucralfate and discharged to [facility]. She presents again with bloody emesis and melena.  EGD demonstrated worsening erosive esophagitis despite compliance with previous therapy.  Started on protonix gtt, carafate, reglan, and strict elevation of head.  May need repeat EGD in a few days to document improvement.    Assessment/Plan:  GI bleed:  EGD demonstrates worsening of erosive esophagitis.  Patient being followed by Dr. Matthias Hughs. -  Continue protonix gtt and carafate -  Appreciate GI recommendations -  Added reglan 12/23, and monitoring for side effects -  Upright as much as possible in chair, minimum 2 h post meals.   -  Elevate head of bed -  Possible repeat EGD in a few days to document healing   HTN:  Blood pressures elevated yesterday -  Continue amlodipine, clonidine, and metoprolol.  -  Continue Lasix  -  Restart valsartan  AKI: Likely due to mild hypovolemia from poor PO intake and GI bleed. Baseline creatinine 0.9-1.  Improved with IVF and transfusion.   - Continue lasix  SOB:  May be due to IVF, but may also be due to obstructive lung disease.  BNP was elevated 4182.  Dyspnea improved with restarting lasix.   -  Continue lasix -  IS  E. Coli UTI, MDR but sensitive to ceftriaxone:  LE + and TNTC WBC on UA -  Continue Ceftriaxone, day 3  CAD: Stable. Patient is not on ASA or Plavix secondary to recurrent UGI bleed.   - Continue BB and adding ARB back today  Acute blood loss anemia: Due to GI bleed.   - Hemoglobin stable  Hypokalemia:   Likely from restricted diet and fluids without potassium.  Replete with oral potassium today.  Diet:  per GI Access: PIV.  IVF:  D/c   Proph: SCD's   Code Status: DNR  Family Communication: Spoke with patient alone today. Emergency contact: Larey Seat (daughter): [phone] or Raynelle Dick (son-in-law): [phone]  Disposition Plan: The family requests that the patient be placed in SNF again at time of d/c. SW aware.     Consultants:  GI, Dr. Dulce Sellar  Procedures:  None  Antibiotics:  Ceftriaxone 12/22 >>   HPI/Subjective:  Patient states that she feels better today.  Denies chest pain, shortness of breath.  Denies hematemesis.  Tolerating advancing diet without difficulty.  Has some "esophagus" pain but improving slightly.    Objective: Filed Vitals:   07/14/12 1316 07/14/12 1400 07/14/12 2209 07/15/12 0535  BP: 128/70 129/74 130/78 130/76  Pulse:  70 75 73  Temp:  98.6 F (37 C) 98.4 F (36.9 C) 98.3 F (36.8 C)  TempSrc:  Oral Oral Oral  Resp:  20 20 20   Height:      Weight:    67.4 kg (148 lb 9.4 oz)  SpO2:  97% 93% 93%    Intake/Output Summary (Last 24 hours) at 07/15/12 0708 Last data filed at 07/14/12 1943  Gross per 24 hour  Intake      0 ml  Output    600 ml  Net   -600 ml  Filed Weights   07/12/12 1540 07/13/12 1615 07/15/12 0535  Weight: 63.277 kg (139 lb 8 oz) 66.1 kg (145 lb 11.6 oz) 67.4 kg (148 lb 9.4 oz)    Exam:  General: Caucasian female, NAD  ENT:  MMM.  Cardiovascular: RRR. 1/6 murmur at LLSB. No rubs or gallops. 2+ pulses.  Respiratory:  No increased WOB.  + wheeze, no rales or rhonchi.   Abdomen:  Normal active bowel sounds. Soft, ND, nontender. No organomegaly.  Musculoskeletal: Normal tone and bulk. No LE edema.    Data Reviewed: Basic Metabolic Panel:  Lab 07/14/12 1610 07/13/12 0530 07/12/12 1127  NA 139 139 135  K 3.6 4.5 3.7  CL 107 108 100  CO2 24 24 24   GLUCOSE 99 100* 119*  BUN 15 26* 38*  CREATININE 1.08  1.22* 1.27*  CALCIUM 8.6 8.4 8.5  MG -- -- --  PHOS -- -- --   Liver Function Tests:  Lab 07/12/12 1127  AST 50*  ALT 19  ALKPHOS 90  BILITOT 0.2*  PROT 6.7  ALBUMIN 3.0*   No results found for this basename: LIPASE:5,AMYLASE:5 in the last 168 hours No results found for this basename: AMMONIA:5 in the last 168 hours CBC:  Lab 07/14/12 0602 07/13/12 0530 07/12/12 2109 07/12/12 1532 07/12/12 1127  WBC 9.9 11.3* 17.3* 14.1* 17.7*  NEUTROABS -- -- -- -- 15.2*  HGB 10.5* 7.4* 8.2* 8.3* 9.0*  HCT 31.9* 22.7* 25.1* 25.4* 27.6*  MCV 85.1 83.2 82.6 81.9 81.7  PLT 296 314 425* 425* 454*   Cardiac Enzymes: No results found for this basename: CKTOTAL:5,CKMB:5,CKMBINDEX:5,TROPONINI:5 in the last 168 hours BNP (last 3 results)  Basename 07/13/12 1941 11/21/11 1040  PROBNP 4182.0* 140.0*   CBG: No results found for this basename: GLUCAP:5 in the last 168 hours  Recent Results (from the past 240 hour(s))  MRSA PCR SCREENING     Status: Normal   Collection Time   07/12/12  4:12 PM      Component Value Range Status Comment   MRSA by PCR NEGATIVE  NEGATIVE Final   URINE CULTURE     Status: Normal   Collection Time   07/12/12  9:24 PM      Component Value Range Status Comment   Specimen Description URINE, CLEAN CATCH   Final    Special Requests CX ADDED AT 2144 ON [id number]   Final    Culture  Setup Time 07/13/2012 02:16   Final    Colony Count >=100,000 COLONIES/ML   Final    Culture ESCHERICHIA COLI   Final    Report Status 07/15/2012 FINAL   Final    Organism ID, Bacteria ESCHERICHIA COLI   Final      Studies: No results found.  Scheduled Meds:    . amitriptyline  50 mg Oral QHS  . amLODipine  5 mg Oral Daily  . cefTRIAXone (ROCEPHIN)  IV  1 g Intravenous Q24H  . cloNIDine  0.1 mg Transdermal Weekly  . feeding supplement  237 mL Oral BID BM  . furosemide  40 mg Oral Daily  . irbesartan  300 mg Oral Daily  . metoCLOPramide  5 mg Oral TID AC & HS  . metoprolol  succinate  100 mg Oral Daily  . polyethylene glycol  17 g Oral Daily  . prednisoLONE acetate  1 drop Both Eyes Daily  . sodium chloride  3 mL Intravenous Q12H  . sucralfate  1 g Oral TID WC & HS  .  topiramate  25 mg Oral QHS   Continuous Infusions:    . pantoprozole (PROTONIX) infusion 8 mg/hr (07/15/12 1610)    Principal Problem:  *GI bleed Active Problems:  CAD (coronary artery disease)  Hypertension  Hyperlipidemia  GERD (gastroesophageal reflux disease)  Acute blood loss anemia  Acute kidney injury  Leukocytosis    Time spent: 30    Vella Colquitt, [facility]  [facility] Pager [phone]. If 8PM-8AM, please contact night-coverage at [email or web address], password [facility] 07/15/2012, 7:08 AM  LOS: 3 days

## 2012-07-15 NOTE — Progress Notes (Signed)
Endoscopic findings from yesterday reviewed.   Patient currently sitting up in chair. Importance of sitting up for an hour or 2 after meals, as tolerated, reviewed with patient. Need for slanting of bed head upward also reviewed.  Patient on Protonix infusion.  Recommendation: Continue current GI therapy for now.  Consider follow-up egd prior to discharge to confirm that the esophagitis is resolving. I will follow the patient periodically; please call us if more immediate input is needed.  Florencia Reasons, M.D. [phone]

## 2012-07-15 NOTE — Evaluation (Signed)
Physical Therapy Evaluation Patient Details Name: [name] MRN: [id number] DOB: Oct 27, 1918 Today's Date: 07/15/2012 Time: 4098-1191 PT Time Calculation (min): 14 min  PT Assessment / Plan / Recommendation Clinical Impression  Pt readmitted for GI bleed.  Needs skilled PT to maximize I and safety to decr burden of care. Recommend ST-SNF.    PT Assessment  Patient needs continued PT services    Follow Up Recommendations  SNF    Does the patient have the potential to tolerate intense rehabilitation      Barriers to Discharge        Equipment Recommendations  None recommended by PT    Recommendations for Other Services     Frequency Min 2X/week    Precautions / Restrictions Precautions Precautions: Fall Restrictions Weight Bearing Restrictions: No   Pertinent Vitals/Pain Lt ankle soreness.      Mobility  Bed Mobility Bed Mobility: Supine to Sit;Sitting - Scoot to Edge of Bed Supine to Sit: 5: Supervision;HOB elevated;With rails Sitting - Scoot to Edge of Bed: 5: Supervision;With rail Transfers Sit to Stand: 4: Min assist;With upper extremity assist;With armrests;From chair/3-in-1;From toilet Stand to Sit: 4: Min assist;With upper extremity assist;With armrests;To toilet Details for Transfer Assistance: assist for balance Ambulation/Gait Ambulation/Gait Assistance: 4: Min assist Ambulation Distance (Feet): 70 Feet Assistive device: Rolling walker Ambulation/Gait Assistance Details: verbal/tactile cues to stay closer to walker with turns. Gait Pattern: Step-through pattern;Decreased stride length;Trunk flexed Gait velocity: decr    Shoulder Instructions     Exercises     PT Diagnosis: Generalized weakness;Difficulty walking  PT Problem List: Decreased strength;Decreased activity tolerance;Decreased knowledge of use of DME;Decreased balance;Decreased mobility PT Treatment Interventions: Gait training;DME instruction;Functional mobility training;Patient/family  education;Therapeutic activities;Therapeutic exercise;Balance training   PT Goals Acute Rehab PT Goals PT Goal Formulation: With patient Time For Goal Achievement: 07/22/12 Potential to Achieve Goals: Good Pt will go Supine/Side to Sit: with supervision PT Goal: Supine/Side to Sit - Progress: Goal set today Pt will go Sit to Supine/Side: with supervision PT Goal: Sit to Supine/Side - Progress: Goal set today Pt will go Sit to Stand: with supervision PT Goal: Sit to Stand - Progress: Goal set today Pt will go Stand to Sit: with supervision PT Goal: Stand to Sit - Progress: Goal set today Pt will Ambulate: >150 feet;with supervision;with least restrictive assistive device PT Goal: Ambulate - Progress: Goal set today  Visit Information  Last PT Received On: 07/15/12 Assistance Needed: +1    Subjective Data  Subjective: "Can I have a blanket."   Prior Functioning  Home Living Lives With: Daughter Available Help at Discharge: Available PRN/intermittently Type of Home: House Home Access: Stairs to enter Secretary/administrator of Steps: 3 Entrance Stairs-Rails: Right Home Layout: One level Bathroom Shower/Tub: Engineer, manufacturing systems: Standard Bathroom Accessibility: Yes How Accessible: Accessible via walker Home Adaptive Equipment: Walker - four wheeled Additional Comments: Daughter in law reports that they are worried about pt's safety when she is home alone during the day; She had recently taken a fall while home alone Prior Function Level of Independence: Needs assistance (D/Ced from [facility]. Only home 1 day before readmitted.) Needs Assistance: Meal Prep;Light Housekeeping Meal Prep: Maximal Light Housekeeping: Maximal Able to Take Stairs?: Yes Driving: No Communication Communication: No difficulties Dominant Hand: Right    Cognition  Overall Cognitive Status: History of cognitive impairments - at baseline Arousal/Alertness: Awake/alert Orientation  Level: Appears intact for tasks assessed Behavior During Session: [facility] for tasks performed    Extremity/Trunk Assessment Right  Upper Extremity Assessment RUE ROM/Strength/Tone: WFL for tasks assessed RUE Sensation: WFL - Light Touch;WFL - Proprioception RUE Coordination: WFL - gross/fine motor Left Upper Extremity Assessment LUE ROM/Strength/Tone: WFL for tasks assessed LUE Sensation: WFL - Light Touch;WFL - Proprioception LUE Coordination: WFL - gross/fine motor Right Lower Extremity Assessment RLE ROM/Strength/Tone: Deficits RLE ROM/Strength/Tone Deficits: grossly 4/5 Left Lower Extremity Assessment LLE ROM/Strength/Tone: Deficits LLE ROM/Strength/Tone Deficits: grossly 4/5 Trunk Assessment Trunk Assessment: Normal   Balance Static Standing Balance Static Standing - Balance Support: Bilateral upper extremity supported (on walker) Static Standing - Level of Assistance: 5: Stand by assistance  End of Session PT - End of Session Equipment Utilized During Treatment: Gait belt Activity Tolerance: Patient tolerated treatment well Patient left: in chair;with call bell/phone within reach Nurse Communication: Mobility status  GP     [facility] 07/15/2012, 9:57 AM  [facility] PT [phone]

## 2012-07-15 NOTE — Progress Notes (Signed)
Occupational Therapy Evaluation Patient Details Name: [name] MRN: [id number] DOB: 1919/01/05 Today's Date: 07/15/2012 Time: 7829-5621 OT Time Calculation (min): 31 min  OT Assessment / Plan / Recommendation Clinical Impression  Pleasant 78 yo with current admission due to GI bleed. Pt was at [facility] for rehab and had D/Ced home only 1 day prior to having to return to the hospital for GI bleed. Pt is unsafe to be alone at home at this time and requires assistance for ADL. Pt will benefit from rehab at [facility] facilitate return to PLOF. Family may want to consider ALF as option after rehab.Pt will benefit from skilled OT services to max independence with ADL and functional moblity for ADL to facilitate D/C to SNF.    OT Assessment  Patient needs continued OT Services    Follow Up Recommendations  SNF    Barriers to Discharge Decreased caregiver support    Equipment Recommendations  None recommended by OT    Recommendations for Other Services    Frequency  Min 2X/week    Precautions / Restrictions Precautions Precautions: Fall Restrictions Weight Bearing Restrictions: No   Pertinent Vitals/Pain  No c/o pain. O2 SATS 96. Pt c/o dizziness initially with mobility but resolved after rest.   ADL  Eating/Feeding: Set up Where Assessed - Eating/Feeding: Chair Grooming: Set up Where Assessed - Grooming: Unsupported sitting Upper Body Bathing: Set up Where Assessed - Upper Body Bathing: Unsupported sitting Lower Body Bathing: Minimal assistance Where Assessed - Lower Body Bathing: Supported sit to stand Upper Body Dressing: Set up Where Assessed - Upper Body Dressing: Unsupported sitting Lower Body Dressing: Minimal assistance Where Assessed - Lower Body Dressing: Supported sit to stand Toilet Transfer: Minimal assistance Toilet Transfer Method: Surveyor, minerals: Materials engineer and Hygiene: Minimal assistance Where  Assessed - Engineer, mining and Hygiene: Sit to stand from 3-in-1 or toilet Equipment Used: Gait belt;Rolling walker Transfers/Ambulation Related to ADLs: min A. Furniture walks ADL Comments: limited by poor endurance    OT Diagnosis: Generalized weakness  OT Problem List: Decreased strength;Decreased activity tolerance;Impaired balance (sitting and/or standing);Impaired vision/perception;Decreased safety awareness;Decreased knowledge of use of DME or AE;Decreased cognition OT Treatment Interventions: Self-care/ADL training;Therapeutic exercise;Neuromuscular education;Energy conservation;Therapeutic activities;Cognitive remediation/compensation;Visual/perceptual remediation/compensation;Patient/family education;Balance training   OT Goals Acute Rehab OT Goals OT Goal Formulation: With patient Time For Goal Achievement: 07/29/12 Potential to Achieve Goals: Good ADL Goals Pt Will Perform Lower Body Bathing: with set-up;with supervision;Sitting at sink;Standing at sink;Unsupported;with cueing (comment type and amount) ADL Goal: Lower Body Bathing - Progress: Goal set today Pt Will Perform Lower Body Dressing: with set-up;with supervision;Unsupported;Sit to stand from chair ADL Goal: Lower Body Dressing - Progress: Goal set today Pt Will Transfer to Toilet: with supervision;Ambulation;with DME;3-in-1 ADL Goal: Toilet Transfer - Progress: Goal set today Pt Will Perform Toileting - Clothing Manipulation: with supervision;Standing ADL Goal: Toileting - Clothing Manipulation - Progress: Goal set today Pt Will Perform Toileting - Hygiene: with supervision;Sit to stand from 3-in-1/toilet ADL Goal: Toileting - Hygiene - Progress: Goal set today  Visit Information  Last OT Received On: 07/15/12 Assistance Needed: +1    Subjective Data      Prior Functioning     Home Living Lives With: Daughter Available Help at Discharge: Available PRN/intermittently Type of Home:  House Home Access: Stairs to enter [employer] of Steps: 3 Entrance Stairs-Rails: Right Home Layout: One level Bathroom Shower/Tub: Engineer, manufacturing systems: Standard Bathroom Accessibility: Yes How Accessible: Accessible via walker  Home Adaptive Equipment: Dan Humphreys - four wheeled Additional Comments: Daughter in law reports that they are worried about pt's safety when she is home alone during the day; She had recently taken a fall while home alone Prior Function Level of Independence: Needs assistance (D/Ced from [facility]. Only home 1 day before readmitted.) Needs Assistance: Meal Prep;Light Housekeeping Meal Prep: Maximal Light Housekeeping: Maximal Able to Take Stairs?: Yes Driving: No Communication Communication: No difficulties Dominant Hand: Right         Vision/Perception  legally blind   Cognition  Overall Cognitive Status: History of cognitive impairments - at baseline Arousal/Alertness: Awake/alert Orientation Level: Appears intact for tasks assessed Behavior During Session: [facility] for tasks performed    Extremity/Trunk Assessment Right Upper Extremity Assessment RUE ROM/Strength/Tone: WFL for tasks assessed RUE Sensation: WFL - Light Touch;WFL - Proprioception RUE Coordination: WFL - gross/fine motor Left Upper Extremity Assessment LUE ROM/Strength/Tone: WFL for tasks assessed LUE Sensation: WFL - Light Touch;WFL - Proprioception LUE Coordination: WFL - gross/fine motor Right Lower Extremity Assessment RLE ROM/Strength/Tone: Deficits RLE ROM/Strength/Tone Deficits: Generalized weakness Left Lower Extremity Assessment LLE ROM/Strength/Tone: Deficits LLE ROM/Strength/Tone Deficits: Generalized weakness Trunk Assessment Trunk Assessment: Normal     Mobility Bed Mobility Bed Mobility: Supine to Sit;Sitting - Scoot to Edge of Bed Supine to Sit: 5: Supervision;HOB elevated;With rails Sitting - Scoot to Edge of Bed: 5: Supervision;With  rail Transfers Transfers: Sit to Stand;Stand to Sit Sit to Stand: 4: Min assist;With upper extremity assist;From bed Stand to Sit: 4: Min assist;With upper extremity assist;To chair/3-in-1 Details for Transfer Assistance: vc for safety               Balance  min A   End of Session OT - End of Session Equipment Utilized During Treatment: Gait belt Activity Tolerance: Patient limited by fatigue Patient left: in chair;with call bell/phone within reach Nurse Communication: Mobility status  GO     Kennadee Walthour,HILLARY 07/15/2012, 9:40 AM Luisa Dago, OTR/L  [phone] 07/15/2012

## 2012-07-16 DIAGNOSIS — D62 Acute posthemorrhagic anemia: Secondary | ICD-10-CM

## 2012-07-16 DIAGNOSIS — K219 Gastro-esophageal reflux disease without esophagitis: Secondary | ICD-10-CM

## 2012-07-16 DIAGNOSIS — I10 Essential (primary) hypertension: Secondary | ICD-10-CM

## 2012-07-16 DIAGNOSIS — N179 Acute kidney failure, unspecified: Secondary | ICD-10-CM

## 2012-07-16 DIAGNOSIS — K922 Gastrointestinal hemorrhage, unspecified: Secondary | ICD-10-CM

## 2012-07-16 DIAGNOSIS — E876 Hypokalemia: Secondary | ICD-10-CM

## 2012-07-16 LAB — BASIC METABOLIC PANEL
Calcium: 8.7 mg/dL (ref 8.4–10.5)
Creatinine, Ser: 1.09 mg/dL (ref 0.50–1.10)
GFR calc non Af Amer: 43 mL/min — ABNORMAL LOW (ref >90)
Sodium: 137 mEq/L (ref 135–145)

## 2012-07-16 LAB — CBC
MCH: 28 pg (ref 26.0–34.0)
MCHC: 33.3 g/dL (ref 30.0–36.0)
MCV: 83.9 fL (ref 78.0–100.0)
Platelets: 289 10*3/uL (ref 150–400)
RDW: 15.4 % (ref 11.5–15.5)

## 2012-07-16 MED ORDER — POTASSIUM CHLORIDE CRYS ER 20 MEQ PO TBCR
40.0000 meq | EXTENDED_RELEASE_TABLET | Freq: Once | ORAL | Status: AC
  Administered 2012-07-16: 40 meq via ORAL
  Filled 2012-07-16: qty 2

## 2012-07-16 MED ORDER — BENZONATATE 100 MG PO CAPS
100.0000 mg | ORAL_CAPSULE | Freq: Two times a day (BID) | ORAL | Status: DC | PRN
  Administered 2012-07-16: 100 mg via ORAL
  Filled 2012-07-16 (×3): qty 1

## 2012-07-16 NOTE — Progress Notes (Signed)
[facility] PROGRESS NOTE  [name] AOZ:[id number] DOB: 12/30/1918 DOA: 07/12/2012 PCP: Tandy Gaw, PA-C  The patient is a 78 yo F with HTN, HLD, OSA, CAD s/p CABG, bioprosthetic aortic valve replacement, GERD, PUD, scleroderma, and recurrent UGI bleeds, who was recently admitted for UGI bleed. In November, she presented with hematemesis and EGD demonstrated ulcerative esophagitis. She was started on BID PPI and QID sucralfate and discharged to [facility]. She presents again with bloody emesis and melena.  EGD demonstrated worsening erosive esophagitis despite compliance with previous therapy.  Started on protonix gtt, carafate, reglan, and strict elevation of head.  May need repeat EGD in a few days to document improvement.    Assessment/Plan:  GI bleed:  EGD demonstrates worsening of erosive esophagitis.  Patient being followed by Dr. Matthias Hughs. -  Continue protonix gtt and carafate -  Appreciate GI recommendations and currently following.  Will f/u with their recommendations. -  Added reglan 12/23, and monitoring for side effects -  Upright as much as possible in chair, minimum 2 h post meals.   -  Elevate head of bed -  Possible repeat EGD in a few days to document healing   HTN:  Blood pressures elevated yesterday -  Continue amlodipine, clonidine, and metoprolol.  -  Continue Lasix  -  Restart valsartan - Blood pressure relatively well controlled with last reading 112/60  AKI: Likely due to mild hypovolemia from poor PO intake and GI bleed. Baseline creatinine 0.9-1.  Improved with IVF and transfusion.   - Continue lasix - currently at or near baseline with creatinine 1.09  SOB:  May be due to IVF, but may also be due to obstructive lung disease.  BNP was elevated 4182.  Dyspnea improved with restarting lasix.   -  Continue lasix  E. Coli UTI, MDR but sensitive to ceftriaxone:  LE + and TNTC WBC on UA -  Continue Ceftriaxone, day 3  CAD: Stable. Patient is not on  ASA or Plavix secondary to recurrent UGI bleed.   - Continue BB and ARB  Acute blood loss anemia: Due to GI bleed.   - Hemoglobin stable  Hypokalemia:  Likely from restricted diet and fluids without potassium.  Replete with oral potassium today.  Diet:  per GI Access: PIV.  IVF:  D/c   Proph: SCD's   Code Status: DNR  Family Communication: Spoke with patient alone today. Emergency contact: Larey Seat (daughter): [phone] or Raynelle Dick (son-in-law): [phone]  Disposition Plan: The family requests that the patient be placed in SNF again at time of d/c. SW aware.     Consultants:  GI, Dr. Dulce Sellar  Procedures:  None  Antibiotics:  Ceftriaxone 12/22 >>   HPI/Subjective: No new problems or complaints reported today.   Objective: Filed Vitals:   07/15/12 0535 07/15/12 2112 07/16/12 0637 07/16/12 0924  BP: 130/76 129/70 125/66 112/60  Pulse: 73 73 73   Temp: 98.3 F (36.8 C) 99 F (37.2 C) 98.5 F (36.9 C)   TempSrc: Oral Oral Oral   Resp: 20 22 24    Height:      Weight: 67.4 kg (148 lb 9.4 oz)     SpO2: 93% 95% 94%     Intake/Output Summary (Last 24 hours) at 07/16/12 1505 Last data filed at 07/16/12 0555  Gross per 24 hour  Intake 687.92 ml  Output    300 ml  Net 387.92 ml   Filed Weights   07/12/12 1540 07/13/12 1615 07/15/12 0535  Weight: 63.277 kg (139 lb 8 oz) 66.1 kg (145 lb 11.6 oz) 67.4 kg (148 lb 9.4 oz)    Exam:  General: Caucasian female, NAD  ENT:  MMM.  Cardiovascular: RRR. 1/6 murmur at LLSB. No rubs or gallops. 2+ pulses.  Respiratory:  No increased WOB.  + wheeze, no rales or rhonchi.   Abdomen:  Normal active bowel sounds. Soft, ND, nontender. No organomegaly.  Neuro:  Patient answers questions appropriately, moves all extremities   Data Reviewed: Basic Metabolic Panel:  Lab 07/16/12 1610 07/15/12 0629 07/14/12 0602 07/13/12 0530 07/12/12 1127  NA 137 138 139 139 135  K 3.1* 3.0* 3.6 4.5 3.7  CL 102 102 107 108 100  CO2  24 24 24 24 24   GLUCOSE 105* 102* 99 100* 119*  BUN 11 12 15  26* 38*  CREATININE 1.09 1.05 1.08 1.22* 1.27*  CALCIUM 8.7 8.7 8.6 8.4 8.5  MG -- -- -- -- --  PHOS -- -- -- -- --   Liver Function Tests:  Lab 07/12/12 1127  AST 50*  ALT 19  ALKPHOS 90  BILITOT 0.2*  PROT 6.7  ALBUMIN 3.0*   No results found for this basename: LIPASE:5,AMYLASE:5 in the last 168 hours No results found for this basename: AMMONIA:5 in the last 168 hours CBC:  Lab 07/16/12 0605 07/15/12 0629 07/14/12 0602 07/13/12 0530 07/12/12 2109 07/12/12 1127  WBC 10.1 9.6 9.9 11.3* 17.3* --  NEUTROABS -- -- -- -- -- 15.2*  HGB 10.8* 10.8* 10.5* 7.4* 8.2* --  HCT 32.4* 32.7* 31.9* 22.7* 25.1* --  MCV 83.9 83.4 85.1 83.2 82.6 --  PLT 289 284 296 314 425* --   Cardiac Enzymes: No results found for this basename: CKTOTAL:5,CKMB:5,CKMBINDEX:5,TROPONINI:5 in the last 168 hours BNP (last 3 results)  Basename 07/13/12 1941 11/21/11 1040  PROBNP 4182.0* 140.0*   CBG: No results found for this basename: GLUCAP:5 in the last 168 hours  Recent Results (from the past 240 hour(s))  MRSA PCR SCREENING     Status: Normal   Collection Time   07/12/12  4:12 PM      Component Value Range Status Comment   MRSA by PCR NEGATIVE  NEGATIVE Final   URINE CULTURE     Status: Normal   Collection Time   07/12/12  9:24 PM      Component Value Range Status Comment   Specimen Description URINE, CLEAN CATCH   Final    Special Requests CX ADDED AT 2144 ON [id number]   Final    Culture  Setup Time 07/13/2012 02:16   Final    Colony Count >=100,000 COLONIES/ML   Final    Culture ESCHERICHIA COLI   Final    Report Status 07/15/2012 FINAL   Final    Organism ID, Bacteria ESCHERICHIA COLI   Final      Studies: No results found.  Scheduled Meds:    . amitriptyline  50 mg Oral QHS  . amLODipine  5 mg Oral Daily  . cefTRIAXone (ROCEPHIN)  IV  1 g Intravenous Q24H  . cloNIDine  0.1 mg Transdermal Weekly  . docusate sodium  100 mg  Oral BID  . feeding supplement  237 mL Oral BID BM  . furosemide  40 mg Oral Daily  . irbesartan  300 mg Oral Daily  . metoCLOPramide  5 mg Oral TID AC & HS  . metoprolol succinate  100 mg Oral Daily  . polyethylene glycol  17 g Oral Daily  .  prednisoLONE acetate  1 drop Both Eyes Daily  . sodium chloride  3 mL Intravenous Q12H  . sucralfate  1 g Oral TID WC & HS  . topiramate  25 mg Oral QHS   Continuous Infusions:    . pantoprozole (PROTONIX) infusion 8 mg/hr (07/16/12 0304)    Principal Problem:  *GI bleed Active Problems:  CAD (coronary artery disease)  Hypertension  Hyperlipidemia  GERD (gastroesophageal reflux disease)  Acute blood loss anemia  Acute kidney injury  Leukocytosis    Time spent: 2    Penny Pia  [facility] Pager [phone] If 8PM-8AM, please contact night-coverage at [email or web address], password [facility] 07/16/2012, 3:05 PM  LOS: 4 days

## 2012-07-16 NOTE — Progress Notes (Signed)
The patient is tolerating her current diet okay. There is some discomfort as the food goes down her esophagus, but it does not sound like there is any significant dysphagia. She is lying in bed with the head of the bed appropriately slanted at about 30. She indicates that this position is comfortable for her. She remains on a Protonix infusion.  Please see yesterday's note. I will plan to check the patient periodically, with possible repeat endoscopy prior to discharge, to make sure that her esophagitis is moving toward resolution. I would imagine that she will need at least another several days of the current treatment before she is ready for discharge.  Florencia Reasons, M.D. [phone]

## 2012-07-17 DIAGNOSIS — M79609 Pain in unspecified limb: Secondary | ICD-10-CM

## 2012-07-17 DIAGNOSIS — Z954 Presence of other heart-valve replacement: Secondary | ICD-10-CM

## 2012-07-17 DIAGNOSIS — I2510 Atherosclerotic heart disease of native coronary artery without angina pectoris: Secondary | ICD-10-CM

## 2012-07-17 DIAGNOSIS — M79672 Pain in left foot: Secondary | ICD-10-CM

## 2012-07-17 LAB — BASIC METABOLIC PANEL
CO2: 23 mEq/L (ref 19–32)
Calcium: 8.7 mg/dL (ref 8.4–10.5)
Chloride: 106 mEq/L (ref 96–112)
GFR calc Af Amer: 55 mL/min — ABNORMAL LOW (ref >90)
Sodium: 140 mEq/L (ref 135–145)

## 2012-07-17 LAB — CBC
MCH: 27.7 pg (ref 26.0–34.0)
Platelets: 289 10*3/uL (ref 150–400)
RBC: 3.75 MIL/uL — ABNORMAL LOW (ref 3.87–5.11)
RDW: 15.3 % (ref 11.5–15.5)
WBC: 10.8 10*3/uL — ABNORMAL HIGH (ref 4.0–10.5)

## 2012-07-17 LAB — URIC ACID: Uric Acid, Serum: 6.7 mg/dL (ref 2.4–7.0)

## 2012-07-17 NOTE — Progress Notes (Signed)
Physical Therapy Treatment Patient Details Name: Sidda Humm MRN: [id number] DOB: 1919/04/15 Today's Date: 07/17/2012 Time: 8657-8469 PT Time Calculation (min): 23 min  PT Assessment / Plan / Recommendation Comments on Treatment Session  pt presents with GIB and now with bil foot pain.  pt's feet so painful that she was unable to WB during transfer.  RN and Nsg Tech made aware.      Follow Up Recommendations  SNF     Does the patient have the potential to tolerate intense rehabilitation     Barriers to Discharge        Equipment Recommendations  None recommended by PT    Recommendations for Other Services    Frequency Min 2X/week   Plan Discharge plan remains appropriate;Frequency remains appropriate    Precautions / Restrictions Precautions Precautions: Fall Restrictions Weight Bearing Restrictions: No   Pertinent Vitals/Pain Indicates Bil feet very painful, but did not rate.  RN made aware.      Mobility  Bed Mobility Bed Mobility: Supine to Sit;Sitting - Scoot to Edge of Bed Supine to Sit: 5: Supervision;With rails;HOB elevated Sitting - Scoot to Edge of Bed: 5: Supervision Details for Bed Mobility Assistance: pt moves slowly, but able to complete without physical A.   Transfers Transfers: Sit to Stand;Stand to Sit;Stand Pivot Transfers Sit to Stand: 1: +2 Total assist;With upper extremity assist;From bed;From chair/3-in-1;With armrests Sit to Stand: Patient Percentage: 10% Stand to Sit: 1: +2 Total assist;With upper extremity assist;To chair/3-in-1;With armrests;To bed Stand to Sit: Patient Percentage: 10% Stand Pivot Transfers: 1: +2 Total assist Stand Pivot Transfers: Patient Percentage: 10% Details for Transfer Assistance: pt unable to WB on Bil feet secondary to pain.  pt required extensive A for on/off 3-in-1.   Ambulation/Gait Ambulation/Gait Assistance: Not tested (comment) Stairs: No Wheelchair Mobility Wheelchair Mobility: No    Exercises     PT  Diagnosis:    PT Problem List:   PT Treatment Interventions:     PT Goals Acute Rehab PT Goals Time For Goal Achievement: 07/22/12 Potential to Achieve Goals: Good PT Goal: Supine/Side to Sit - Progress: Progressing toward goal PT Goal: Sit to Supine/Side - Progress: Progressing toward goal PT Goal: Sit to Stand - Progress: Progressing toward goal PT Goal: Stand to Sit - Progress: Progressing toward goal PT Transfer Goal: Bed to Chair/Chair to Bed - Progress: Progressing toward goal  Visit Information  Last PT Received On: 07/17/12 Assistance Needed: +2 PT/OT Co-Evaluation/Treatment: Yes    Subjective Data  Subjective: I need to use the bathroom.     Cognition  Overall Cognitive Status: History of cognitive impairments - at baseline Arousal/Alertness: Awake/alert Orientation Level: Appears intact for tasks assessed Behavior During Session: [facility] for tasks performed    Balance  Balance Balance Assessed: No  End of Session PT - End of Session Equipment Utilized During Treatment: Gait belt Activity Tolerance: Patient limited by pain Patient left: in bed;with call bell/phone within reach Nurse Communication: Mobility status;Patient requests pain meds   GP     Sunny Schlein, [place] [phone] 07/17/2012, 9:22 AM

## 2012-07-17 NOTE — Progress Notes (Signed)
Occupational Therapy Treatment Patient Details Name: [name] MRN: [id number] DOB: [birth year] Today's Date: 07/17/2012 Time: 4098-1191 OT Time Calculation (min): 23 min  OT Assessment / Plan / Recommendation Comments on Treatment Session Pt limited today due to bil foot pain and unable to WB through feet during transfer.  RN made aware.    Follow Up Recommendations  SNF    Barriers to Discharge       Equipment Recommendations  None recommended by OT    Recommendations for Other Services    Frequency Min 2X/week   Plan Discharge plan remains appropriate    Precautions / Restrictions Precautions Precautions: Fall Restrictions Weight Bearing Restrictions: No   Pertinent Vitals/Pain See vitals    ADL  Grooming: Performed;Wash/dry hands;Set up Where Assessed - Grooming: Supine, head of bed up Toilet Transfer: Performed;+2 Total assistance Toilet Transfer: Patient Percentage: 10% Toilet Transfer Method: Sit to stand;Stand pivot Toilet Transfer Equipment: Bedside commode Toileting - Clothing Manipulation and Hygiene: Performed;Maximal assistance Where Assessed - Engineer, mining and Hygiene: Sit on 3-in-1 or toilet Equipment Used: Gait belt Transfers/Ambulation Related to ADLs: +2 assist for stand pivot transfer due to bil foot pain (pt unable to WB through feet) ADL Comments: Pt leaned anteriorly and received max assist for back peri care (pt unable to tolerate standing for toileting hygiene).    OT Diagnosis:    OT Problem List:   OT Treatment Interventions:     OT Goals ADL Goals Pt Will Transfer to Toilet: with supervision;Ambulation;with DME;3-in-1 ADL Goal: Toilet Transfer - Progress: Not progressing (due to bil foot pain\) Pt Will Perform Toileting - Clothing Manipulation: with supervision;Standing ADL Goal: Toileting - Clothing Manipulation - Progress: Not progressing (unable to tolerate standing due to bil foot pain) Pt Will Perform Toileting  - Hygiene: with supervision;Sit to stand from 3-in-1/toilet ADL Goal: Toileting - Hygiene - Progress: Not progressing (unable to tolerate standing due to bil foot pain)  Visit Information  Last OT Received On: 07/17/12 Assistance Needed: +2 PT/OT Co-Evaluation/Treatment: Yes    Subjective Data      Prior Functioning       Cognition  Overall Cognitive Status: History of cognitive impairments - at baseline Arousal/Alertness: Awake/alert Orientation Level: Appears intact for tasks assessed Behavior During Session: [name] for tasks performed    Mobility  Shoulder Instructions Bed Mobility Bed Mobility: Supine to Sit;Sitting - Scoot to Edge of Bed Supine to Sit: 5: Supervision;With rails;HOB elevated Sitting - Scoot to Edge of Bed: 5: Supervision Details for Bed Mobility Assistance: pt moves slowly, but able to complete without physical A.   Transfers Transfers: Sit to Stand;Stand to Sit Sit to Stand: 1: +2 Total assist;With upper extremity assist;From bed;From chair/3-in-1;With armrests Sit to Stand: Patient Percentage: 10% Stand to Sit: 1: +2 Total assist;With upper extremity assist;To chair/3-in-1;With armrests;To bed Stand to Sit: Patient Percentage: 10% Details for Transfer Assistance: pt unable to WB on Bil feet secondary to pain.  pt required extensive A for on/off 3-in-1.         Exercises      Balance Balance Balance Assessed: No   End of Session OT - End of Session Equipment Utilized During Treatment: Gait belt Activity Tolerance: Patient tolerated treatment well Patient left: in bed;with call bell/phone within reach;with bed alarm set Nurse Communication: Mobility status (bil foot pain)  GO   07/17/2012 Cipriano Mile OTR/L Pager [phone] Office [phone]   Cipriano Mile 07/17/2012, 10:47 AM

## 2012-07-17 NOTE — Progress Notes (Signed)
No SNF bed offers as of yet- refaxed FL2 today to area SNF's and will advise- Will update daughter Toniann Fail, as well. Reece Levy, MSW, Theresia Majors [phone]

## 2012-07-17 NOTE — Progress Notes (Signed)
[facility] PROGRESS NOTE  [name] WUJ:[id number] DOB: 31-Dec-1918 DOA: 07/12/2012 PCP: Tandy Gaw, PA-C  The patient is a 78 yo F with HTN, HLD, OSA, CAD s/p CABG, bioprosthetic aortic valve replacement, GERD, PUD, scleroderma, and recurrent UGI bleeds, who was recently admitted for UGI bleed. In November, she presented with hematemesis and EGD demonstrated ulcerative esophagitis. She was started on BID PPI and QID sucralfate and discharged to [facility]. She presents again with bloody emesis and melena.  EGD demonstrated worsening erosive esophagitis despite compliance with previous therapy.  Started on protonix gtt, carafate, reglan, and strict elevation of head.  May need repeat EGD in a few days to document improvement.    Assessment/Plan:  GI bleed:  EGD demonstrates worsening of erosive esophagitis.  Patient being followed by Dr. Matthias Hughs. -  Continue protonix gtt and carafate -  Appreciate GI recommendations and currently following.  Will f/u with their recommendations. -  Added reglan 12/23, and monitoring for side effects -  Upright as much as possible in chair, minimum 2 h post meals.   -  Elevate head of bed -  Possible repeat EGD in a few days to document healing.  Discussed with daughter.  Will f/u with GI's recommendations.   HTN:  Blood pressures elevated yesterday -  Continue amlodipine, clonidine, and metoprolol.  -  Continue Lasix  -  Restart valsartan - Blood pressure relatively well controlled with last reading 140/69  AKI: Likely due to mild hypovolemia from poor PO intake and GI bleed. Baseline creatinine 0.9-1.  Improved with IVF and transfusion.   - Continue lasix - currently at or near baseline with creatinine 1.01  SOB:  May be due to IVF, but may also be due to obstructive lung disease.  BNP was elevated 4182.  Dyspnea improved with restarting lasix.   -  Continue lasix  E. Coli UTI, MDR but sensitive to ceftriaxone:  LE + and TNTC WBC on  UA -  Continue Ceftriaxone, day 5  CAD: Stable. Patient is not on ASA or Plavix secondary to recurrent UGI bleed.   - Continue BB and ARB   Acute blood loss anemia: Due to GI bleed.   - Hemoglobin stable, no active bleed reported today  Hypokalemia:  Likely from restricted diet and fluids without potassium.   - Resolved after oral repletion.  Pain in both feet - Patient evaluated and no cellulitis or recent trauma to area - Will order uric acid. Patient reports family history of gout.  Diet:  per GI Access: PIV.  IVF:  D/c   Proph: SCD's   Code Status: DNR  Family Communication: Spoke with patient alone today. Emergency contact: Larey Seat (daughter): [phone] or Raynelle Dick (son-in-law): [phone]  Disposition Plan: The family requests that the patient be placed in SNF again at time of d/c. SW aware.     Consultants:  GI, Dr. Dulce Sellar  Procedures:  None  Antibiotics:  Ceftriaxone 12/22 >>   HPI/Subjective: Patient complaining of BL pain at feet.  Is having trouble bearing weight on her feet as a result.  States that brother has history of gout.  No new complaints otherwise.  No acute issues reported overnight.   Objective: Filed Vitals:   07/16/12 1420 07/16/12 2106 07/17/12 0435 07/17/12 1339  BP: 128/68 125/70 118/70 140/69  Pulse: 76 74 74 71  Temp: 98.6 F (37 C) 98.8 F (37.1 C) 98.8 F (37.1 C) 97.6 F (36.4 C)  TempSrc: Oral Oral Oral Oral  Resp: 18 18 18 18   Height:      Weight:      SpO2: 96% 94% 94% 97%    Intake/Output Summary (Last 24 hours) at 07/17/12 1521 Last data filed at 07/17/12 1340  Gross per 24 hour  Intake 687.08 ml  Output    900 ml  Net -212.92 ml   Filed Weights   07/12/12 1540 07/13/12 1615 07/15/12 0535  Weight: 63.277 kg (139 lb 8 oz) 66.1 kg (145 lb 11.6 oz) 67.4 kg (148 lb 9.4 oz)    Exam:  General: Caucasian female, NAD  ENT:  MMM.  Cardiovascular: RRR. 1/6 murmur at LLSB. No rubs or gallops. 2+ pulses.   Respiratory:  No increased WOB.  + wheeze, no rales or rhonchi.   Abdomen:  Normal active bowel sounds. Soft, ND, nontender. No organomegaly.  Neuro:  Patient answers questions appropriately, moves all extremities   Data Reviewed: Basic Metabolic Panel:  Lab 07/17/12 5409 07/16/12 0605 07/15/12 0629 07/14/12 0602 07/13/12 0530  NA 140 137 138 139 139  K 3.5 3.1* 3.0* 3.6 4.5  CL 106 102 102 107 108  CO2 23 24 24 24 24   GLUCOSE 100* 105* 102* 99 100*  BUN 10 11 12 15  26*  CREATININE 1.01 1.09 1.05 1.08 1.22*  CALCIUM 8.7 8.7 8.7 8.6 8.4  MG -- -- -- -- --  PHOS -- -- -- -- --   Liver Function Tests:  Lab 07/12/12 1127  AST 50*  ALT 19  ALKPHOS 90  BILITOT 0.2*  PROT 6.7  ALBUMIN 3.0*   No results found for this basename: LIPASE:5,AMYLASE:5 in the last 168 hours No results found for this basename: AMMONIA:5 in the last 168 hours CBC:  Lab 07/17/12 0542 07/16/12 0605 07/15/12 0629 07/14/12 0602 07/13/12 0530 07/12/12 1127  WBC 10.8* 10.1 9.6 9.9 11.3* --  NEUTROABS -- -- -- -- -- 15.2*  HGB 10.4* 10.8* 10.8* 10.5* 7.4* --  HCT 31.6* 32.4* 32.7* 31.9* 22.7* --  MCV 84.3 83.9 83.4 85.1 83.2 --  PLT 289 289 284 296 314 --   Cardiac Enzymes: No results found for this basename: CKTOTAL:5,CKMB:5,CKMBINDEX:5,TROPONINI:5 in the last 168 hours BNP (last 3 results)  Basename 07/13/12 1941 11/21/11 1040  PROBNP 4182.0* 140.0*   CBG: No results found for this basename: GLUCAP:5 in the last 168 hours  Recent Results (from the past 240 hour(s))  MRSA PCR SCREENING     Status: Normal   Collection Time   07/12/12  4:12 PM      Component Value Range Status Comment   MRSA by PCR NEGATIVE  NEGATIVE Final   URINE CULTURE     Status: Normal   Collection Time   07/12/12  9:24 PM      Component Value Range Status Comment   Specimen Description URINE, CLEAN CATCH   Final    Special Requests CX ADDED AT 2144 ON [id number]   Final    Culture  Setup Time 07/13/2012 02:16   Final     Colony Count >=100,000 COLONIES/ML   Final    Culture ESCHERICHIA COLI   Final    Report Status 07/15/2012 FINAL   Final    Organism ID, Bacteria ESCHERICHIA COLI   Final      Studies: No results found.  Scheduled Meds:    . amitriptyline  50 mg Oral QHS  . amLODipine  5 mg Oral Daily  . cefTRIAXone (ROCEPHIN)  IV  1 g Intravenous Q24H  .  cloNIDine  0.1 mg Transdermal Weekly  . docusate sodium  100 mg Oral BID  . feeding supplement  237 mL Oral BID BM  . furosemide  40 mg Oral Daily  . irbesartan  300 mg Oral Daily  . metoCLOPramide  5 mg Oral TID AC & HS  . metoprolol succinate  100 mg Oral Daily  . polyethylene glycol  17 g Oral Daily  . prednisoLONE acetate  1 drop Both Eyes Daily  . sodium chloride  3 mL Intravenous Q12H  . sucralfate  1 g Oral TID WC & HS  . topiramate  25 mg Oral QHS   Continuous Infusions:    . pantoprozole (PROTONIX) infusion 8 mg/hr (07/17/12 1428)    Principal Problem:  *GI bleed Active Problems:  CAD (coronary artery disease)  Hypertension  Hyperlipidemia  GERD (gastroesophageal reflux disease)  Acute blood loss anemia  Acute kidney injury  Leukocytosis    Time spent: 84    Penny Pia  [facility] Pager [phone] If 8PM-8AM, please contact night-coverage at [email or web address], password [facility] : [facility] 07/17/2012, 3:21 PM  LOS: 5 days

## 2012-07-18 DIAGNOSIS — D649 Anemia, unspecified: Secondary | ICD-10-CM

## 2012-07-18 LAB — BASIC METABOLIC PANEL
CO2: 22 mEq/L (ref 19–32)
Calcium: 8.9 mg/dL (ref 8.4–10.5)
Creatinine, Ser: 0.96 mg/dL (ref 0.50–1.10)
Glucose, Bld: 93 mg/dL (ref 70–99)

## 2012-07-18 LAB — CBC
HCT: 31.7 % — ABNORMAL LOW (ref 36.0–46.0)
MCH: 27.9 pg (ref 26.0–34.0)
MCV: 84.3 fL (ref 78.0–100.0)
Platelets: 275 10*3/uL (ref 150–400)
WBC: 10 10*3/uL (ref 4.0–10.5)

## 2012-07-18 MED ORDER — GUAIFENESIN-DM 100-10 MG/5ML PO SYRP
5.0000 mL | ORAL_SOLUTION | ORAL | Status: DC | PRN
  Administered 2012-07-18: 5 mL via ORAL
  Filled 2012-07-18: qty 5

## 2012-07-18 MED ORDER — DIPHENHYDRAMINE HCL 25 MG PO CAPS
25.0000 mg | ORAL_CAPSULE | Freq: Once | ORAL | Status: AC
  Administered 2012-07-18: 25 mg via ORAL
  Filled 2012-07-18: qty 1

## 2012-07-18 MED ORDER — INFLUENZA VIRUS VACC SPLIT PF IM SUSP
0.5000 mL | INTRAMUSCULAR | Status: AC
  Filled 2012-07-18: qty 0.5

## 2012-07-18 NOTE — Progress Notes (Signed)
Physical Therapy Treatment Patient Details Name: [name] MRN: [id number] DOB: 1918/08/09 Today's Date: 07/18/2012 Time: 2130-8657 PT Time Calculation (min): 26 min  PT Assessment / Plan / Recommendation Comments on Treatment Session  Patient's mobility limited by pain in bil. feet. Will need to use lift equipment to get patient OOB.  Did well with exercises.    Follow Up Recommendations  SNF     Does the patient have the potential to tolerate intense rehabilitation     Barriers to Discharge        Equipment Recommendations  None recommended by PT    Recommendations for Other Services    Frequency Min 2X/week   Plan Discharge plan remains appropriate;Frequency remains appropriate    Precautions / Restrictions Precautions Precautions: Fall Restrictions Weight Bearing Restrictions: No   Pertinent Vitals/Pain Pain in bil. Feet significantly impacting mobility.    Mobility  Bed Mobility Bed Mobility: Rolling Right;Rolling Left;Left Sidelying to Sit;Sitting - Scoot to Edge of Bed;Sit to Supine Rolling Right: 5: Supervision;With rail Rolling Left: 5: Supervision;With rail Left Sidelying to Sit: 5: Supervision;With rails;HOB flat Sitting - Scoot to Edge of Bed: 5: Supervision;With rail Sit to Supine: 5: Supervision;With rail;HOB flat Details for Bed Mobility Assistance: Verbal cues for safety and technique.  Patient able to complete bed mobility using rails with increased time. Transfers Transfers: Sit to Stand Sit to Stand: 1: +2 Total assist;With upper extremity assist;From bed Sit to Stand: Patient Percentage: 10% Details for Transfer Assistance: Attempt to stand. Patient with increased pain with any weightbearing on bil. feet.  Unable to lift hips off bed due to pain.  Remained sitting for exercises. Ambulation/Gait Ambulation/Gait Assistance: Not tested (comment)    Exercises General Exercises - Upper Extremity Shoulder Flexion: AROM;Both;10 reps;Seated Shoulder  Horizontal ADduction: AROM;Both;10 reps;Seated General Exercises - Lower Extremity Ankle Circles/Pumps: AROM;Both;10 reps;Seated Long Arc Quad: AROM;Both;10 reps;Seated Hip ABduction/ADduction: AROM;Both;10 reps;Seated Hip Flexion/Marching: AROM;Both;10 reps;Seated     PT Goals Acute Rehab PT Goals Pt will go Supine/Side to Sit: with modified independence;with rail;with HOB 0 degrees PT Goal: Supine/Side to Sit - Progress: Updated due to goal met Pt will go Sit to Supine/Side: with modified independence;with HOB 0 degrees;with rail PT Goal: Sit to Supine/Side - Progress: Updated due to goal met PT Goal: Sit to Stand - Progress: Progressing toward goal  Visit Information  Last PT Received On: 07/18/12 Assistance Needed: +2    Subjective Data  Subjective: "My feet still hurt, but not as bad as yesterday."   Cognition  Overall Cognitive Status: History of cognitive impairments - at baseline Arousal/Alertness: Awake/alert Orientation Level: Appears intact for tasks assessed Behavior During Session: [employer] for tasks performed    Balance  Balance Balance Assessed: Yes Dynamic Sitting Balance Dynamic Sitting - Balance Support: No upper extremity supported;Feet unsupported Dynamic Sitting - Level of Assistance: 5: Stand by assistance Dynamic Sitting - Comments: Patient able to maintain sitting balance while completing exercises with all extremities with standby assist.  End of Session PT - End of Session Equipment Utilized During Treatment: Gait belt Activity Tolerance: Patient limited by pain Patient left: in bed;with call bell/phone within reach Nurse Communication: Mobility status;Need for lift equipment   GP     Vena Austria 07/18/2012, 3:20 PM Durenda Hurt. Renaldo Fiddler, [facility] Acute Rehab Services Pager [phone]

## 2012-07-18 NOTE — Progress Notes (Signed)
[facility] PROGRESS NOTE  [name] ZOX:[id number] DOB: 05-Jul-1919 DOA: 07/12/2012 PCP: Tandy Gaw, PA-C  The patient is a 78 yo F with HTN, HLD, OSA, CAD s/p CABG, bioprosthetic aortic valve replacement, GERD, PUD, scleroderma, and recurrent UGI bleeds, who was recently admitted for UGI bleed. In November, she presented with hematemesis and EGD demonstrated ulcerative esophagitis. She was started on BID PPI and QID sucralfate and discharged to [facility]. She presents again with bloody emesis and melena.  EGD demonstrated worsening erosive esophagitis despite compliance with previous therapy.  Started on protonix gtt, carafate, reglan, and strict elevation of head.  May need repeat EGD in a few days to document improvement.    Assessment/Plan:  GI bleed:  EGD demonstrates worsening of erosive esophagitis.  Patient being followed by Dr. Matthias Hughs. -  Continue protonix gtt and carafate -  Appreciate GI recommendations and currently following.  Will f/u with their recommendations. -  Added reglan 12/23, and monitoring for side effects -  Upright as much as possible in chair, minimum 2 h post meals.   -  Elevate head of bed -  Possible repeat EGD 07/21/12 to evaluate improvement likely in 1 month.  Continue high dose PPI at this juncture.   HTN:  Blood pressures elevated yesterday -  Stable on current regimen will continue to monitor and adjust medication pending further BP values.  AKI: Likely due to mild hypovolemia from poor PO intake and GI bleed. Baseline creatinine 0.9-1.  Improved with IVF and transfusion.   - Continue lasix - currently at or near baseline with creatinine 0.96  SOB:  May be due to IVF, but may also be due to obstructive lung disease.  BNP was elevated 4182.  Dyspnea improved with restarting lasix.   -  Continue lasix 40 mg po daily  E. Coli UTI, MDR but sensitive to ceftriaxone:  LE + and TNTC WBC on UA -  Continue Ceftriaxone, day 6  Will d/c likely  tomorrow 07/19/12  CAD: Stable. Patient is not on ASA or Plavix secondary to recurrent UGI bleed.   - Continue BB and ARB   Acute blood loss anemia: Due to GI bleed.   - Hemoglobin stable, no active bleed reported today  Hypokalemia:  Likely from restricted diet and fluids without potassium.   - Resolved after oral repletion.  Pain in both feet - Patient evaluated and no cellulitis or recent trauma to area - Resolving without intervention uric acid WNL at upper end of normal.  - May also be from arthritis  Diet:  per GI Access: PIV.  IVF:  D/c   Proph: SCD's   Code Status: DNR  Family Communication: Spoke with patient alone today. Emergency contact: Larey Seat (daughter): [phone] or Raynelle Dick (son-in-law): [phone]  Disposition Plan: The family requests that the patient be placed in SNF again at time of d/c. SW aware.     Consultants:  GI, Dr. Dulce Sellar  Procedures:  None  Antibiotics:  Ceftriaxone 12/22 >>   HPI/Subjective: Patient has no new complaints today.  Mentions that she feels better.   Objective: Filed Vitals:   07/17/12 2201 07/18/12 0503 07/18/12 0949 07/18/12 1446  BP:  129/78 118/52 117/57  Pulse:  81 90 76  Temp: 98.7 F (37.1 C) 98.5 F (36.9 C)  98.8 F (37.1 C)  TempSrc: Oral Oral  Oral  Resp:  20  20  Height:      Weight:      SpO2:  94%  97%    Intake/Output Summary (Last 24 hours) at 07/18/12 1556 Last data filed at 07/18/12 1309  Gross per 24 hour  Intake    824 ml  Output    500 ml  Net    324 ml   Filed Weights   07/12/12 1540 07/13/12 1615 07/15/12 0535  Weight: 63.277 kg (139 lb 8 oz) 66.1 kg (145 lb 11.6 oz) 67.4 kg (148 lb 9.4 oz)    Exam:  General: Caucasian female, NAD  ENT:  MMM.  Cardiovascular: RRR. 1/6 murmur at LLSB. No rubs or gallops. 2+ pulses.  Respiratory:  No increased WOB.  + wheeze, no rales or rhonchi.   Abdomen:  Normal active bowel sounds. Soft, ND, nontender. No organomegaly.  Neuro:   Patient answers questions appropriately, moves all extremities Musculoskeletal: no cyanosis or clubbing.   Data Reviewed: Basic Metabolic Panel:  Lab 07/18/12 1610 07/17/12 0542 07/16/12 0605 07/15/12 0629 07/14/12 0602  NA 135 140 137 138 139  K 3.5 3.5 3.1* 3.0* 3.6  CL 102 106 102 102 107  CO2 22 23 24 24 24   GLUCOSE 93 100* 105* 102* 99  BUN 11 10 11 12 15   CREATININE 0.96 1.01 1.09 1.05 1.08  CALCIUM 8.9 8.7 8.7 8.7 8.6  MG -- -- -- -- --  PHOS -- -- -- -- --   Liver Function Tests:  Lab 07/12/12 1127  AST 50*  ALT 19  ALKPHOS 90  BILITOT 0.2*  PROT 6.7  ALBUMIN 3.0*   No results found for this basename: LIPASE:5,AMYLASE:5 in the last 168 hours No results found for this basename: AMMONIA:5 in the last 168 hours CBC:  Lab 07/18/12 0625 07/17/12 0542 07/16/12 0605 07/15/12 0629 07/14/12 0602 07/12/12 1127  WBC 10.0 10.8* 10.1 9.6 9.9 --  NEUTROABS -- -- -- -- -- 15.2*  HGB 10.5* 10.4* 10.8* 10.8* 10.5* --  HCT 31.7* 31.6* 32.4* 32.7* 31.9* --  MCV 84.3 84.3 83.9 83.4 85.1 --  PLT 275 289 289 284 296 --   Cardiac Enzymes: No results found for this basename: CKTOTAL:5,CKMB:5,CKMBINDEX:5,TROPONINI:5 in the last 168 hours BNP (last 3 results)  Basename 07/13/12 1941 11/21/11 1040  PROBNP 4182.0* 140.0*   CBG: No results found for this basename: GLUCAP:5 in the last 168 hours  Recent Results (from the past 240 hour(s))  MRSA PCR SCREENING     Status: Normal   Collection Time   07/12/12  4:12 PM      Component Value Range Status Comment   MRSA by PCR NEGATIVE  NEGATIVE Final   URINE CULTURE     Status: Normal   Collection Time   07/12/12  9:24 PM      Component Value Range Status Comment   Specimen Description URINE, CLEAN CATCH   Final    Special Requests CX ADDED AT 2144 ON [id number]   Final    Culture  Setup Time 07/13/2012 02:16   Final    Colony Count >=100,000 COLONIES/ML   Final    Culture ESCHERICHIA COLI   Final    Report Status 07/15/2012 FINAL    Final    Organism ID, Bacteria ESCHERICHIA COLI   Final      Studies: No results found.  Scheduled Meds:    . amitriptyline  50 mg Oral QHS  . amLODipine  5 mg Oral Daily  . cefTRIAXone (ROCEPHIN)  IV  1 g Intravenous Q24H  . cloNIDine  0.1 mg Transdermal Weekly  . docusate  sodium  100 mg Oral BID  . feeding supplement  237 mL Oral BID BM  . furosemide  40 mg Oral Daily  . influenza  inactive virus vaccine  0.5 mL Intramuscular Tomorrow-1000  . irbesartan  300 mg Oral Daily  . metoCLOPramide  5 mg Oral TID AC & HS  . metoprolol succinate  100 mg Oral Daily  . polyethylene glycol  17 g Oral Daily  . prednisoLONE acetate  1 drop Both Eyes Daily  . sodium chloride  3 mL Intravenous Q12H  . sucralfate  1 g Oral TID WC & HS  . topiramate  25 mg Oral QHS   Continuous Infusions:    . pantoprozole (PROTONIX) infusion 8 mg/hr (07/18/12 1352)    Principal Problem:  *GI bleed Active Problems:  CAD (coronary artery disease)  Hypertension  Hyperlipidemia  GERD (gastroesophageal reflux disease)  Acute blood loss anemia  Acute kidney injury  Leukocytosis  Pain in both feet    Time spent: 30    Penny Pia  [facility] Pager [phone] If 8PM-8AM, please contact night-coverage at [email or web address], password [facility] 07/18/2012, 3:56 PM  LOS: 6 days

## 2012-07-18 NOTE — Progress Notes (Signed)
I have confirmed a SNF bed for patient at [facility] and as long as they have a bed available at time of d/c they can accept her-  Will advise patient, daughter and MD and await word on d/c date. FL2 in chart for MD signature-  Reece Levy, MSW, [employer] [phone]

## 2012-07-18 NOTE — Progress Notes (Signed)
[facility] Progress Note  Subjective: Patient states she feels fair, no dysphagia, slight hypersensitivity to cold liquids no difficulty with pills  Objective: Vital signs in last 24 hours: Temp:  [97.6 F (36.4 C)-100 F (37.8 C)] 98.5 F (36.9 C) (12/27 0503) Pulse Rate:  [71-89] 81  (12/27 0503) Resp:  [18-28] 20  (12/27 0503) BP: (129-148)/(63-78) 129/78 mmHg (12/27 0503) SpO2:  [94 %-97 %] 94 % (12/27 0503) Weight change:    PE: Alert oriented detailed exam not done  Lab Results: Results for orders placed during the hospital encounter of 07/12/12 (from the past 24 hour(s))  URIC ACID     Status: Normal   Collection Time   07/17/12  3:31 PM      Component Value Range   Uric Acid, Serum 6.7  2.4 - 7.0 mg/dL  CBC     Status: Abnormal   Collection Time   07/18/12  6:25 AM      Component Value Range   WBC 10.0  4.0 - 10.5 K/uL   RBC 3.76 (*) 3.87 - 5.11 MIL/uL   Hemoglobin 10.5 (*) 12.0 - 15.0 g/dL   HCT 16.1 (*) 09.6 - 04.5 %   MCV 84.3  78.0 - 100.0 fL   MCH 27.9  26.0 - 34.0 pg   MCHC 33.1  30.0 - 36.0 g/dL   RDW 40.9  81.1 - 91.4 %   Platelets 275  150 - 400 K/uL  BASIC METABOLIC PANEL     Status: Abnormal   Collection Time   07/18/12  6:25 AM      Component Value Range   Sodium 135  135 - 145 mEq/L   Potassium 3.5  3.5 - 5.1 mEq/L   Chloride 102  96 - 112 mEq/L   CO2 22  19 - 32 mEq/L   Glucose, Bld 93  70 - 99 mg/dL   BUN 11  6 - 23 mg/dL   Creatinine, Ser 7.82  0.50 - 1.10 mg/dL   Calcium 8.9  8.4 - 95.6 mg/dL   GFR calc non Af Amer 50 (*) >90 mL/min   GFR calc Af Amer 58 (*) >90 mL/min    Studies/Results: No results found.    Assessment: Exudative erosive esophagitis, presumably associated with some low-grade GI bleeding with appearance more dramatic on December 23 than on previous study 3 weeks ago.  Plan: Continuation of high-dose PPI, probably by infusion until close to discharge then switched to by mouth. She is also getting  Carafate elixir and low-dose Reglan. Although it would not be unreasonable to repeat her endoscopy I do not think she would have enough time to develop significant healing in the short term and would probably delay this for a month or 2 if it is to be done. Will will continue to follow while in hospital.   Kamden Reber C 07/18/2012, 8:38 AM

## 2012-07-19 LAB — BASIC METABOLIC PANEL
Calcium: 8.8 mg/dL (ref 8.4–10.5)
Creatinine, Ser: 1.02 mg/dL (ref 0.50–1.10)
GFR calc Af Amer: 54 mL/min — ABNORMAL LOW (ref >90)

## 2012-07-19 LAB — CBC
MCH: 27 pg (ref 26.0–34.0)
MCV: 84.2 fL (ref 78.0–100.0)
Platelets: 292 10*3/uL (ref 150–400)
RDW: 15.2 % (ref 11.5–15.5)

## 2012-07-19 NOTE — Progress Notes (Signed)
[facility] PROGRESS NOTE  [name] ZOX:[id number] DOB: 1918/11/05 DOA: 07/12/2012 PCP: Tandy Gaw, PA-C  The patient is a 78 yo F with HTN, HLD, OSA, CAD s/p CABG, bioprosthetic aortic valve replacement, GERD, PUD, scleroderma, and recurrent UGI bleeds, who was recently admitted for UGI bleed. In November, she presented with hematemesis and EGD demonstrated ulcerative esophagitis. She was started on BID PPI and QID sucralfate and discharged to [facility]. She presents again with bloody emesis and melena.  EGD demonstrated worsening erosive esophagitis despite compliance with previous therapy.  Started on protonix gtt, carafate, reglan, and strict elevation of head.  May need repeat EGD in a few days to document improvement.    Assessment/Plan:  GI bleed:  EGD demonstrates worsening of erosive esophagitis.  Patient being followed by [facility] -  Continue protonix gtt and carafate per my discussion with Dr. Matthias Hughs he preferred to keep paint on IV PPI over the weekend. -  Appreciate GI recommendations and currently following.  Will f/u with their recommendations. -  Added reglan 12/23, and monitoring for side effects (non reported) -  Upright as much as possible in chair, minimum 2 h post meals.   -  Elevate head of bed -  Possible repeat EGD 07/21/12 to evaluate improvement likely in 1 month.  Continue high dose PPI at this juncture.   HTN:  Blood pressures elevated yesterday -  Stable on current regimen will continue to monitor and adjust medication pending further BP values.  AKI: Likely due to mild hypovolemia from poor PO intake and GI bleed. Baseline creatinine 0.9-1.  Improved with IVF and transfusion.   - Continue lasix - currently at or near baseline with creatinine 0.96  SOB:  May be due to IVF, but may also be due to obstructive lung disease.  BNP was elevated 4182.  Dyspnea improved with restarting lasix.   -  Resolved on lasix. Continue lasix 40 mg po daily  E.  Coli UTI, MDR but sensitive to ceftriaxone:  LE + and TNTC WBC on UA -  Continue Ceftriaxone, day 7  Will d/c after today's dose 07/19/12 given seven days of antibiotic treatment.  CAD: Stable. Patient is not on ASA or Plavix secondary to recurrent UGI bleed.   - Continue BB and ARB   Acute blood loss anemia: Due to GI bleed.   - Hemoglobin stable although slight decrease today from 10.5 to 9.2 will continue to monitor, no active bleed reported today  Hypokalemia:  Likely from restricted diet and fluids without potassium.   - Resolved after oral repletion.  Will d/c daily BMP's  Pain in both feet - Patient evaluated and no cellulitis or recent trauma to area - Resolving without intervention uric acid WNL at upper end of normal.  - May also be from arthritis  Diet:  per GI Access: PIV.  IVF:  D/c   Proph: SCD's   Code Status: DNR  Family Communication: Spoke with patient alone today. Emergency contact: Larey Seat (daughter): [phone] or Raynelle Dick (son-in-law): [phone]  Disposition Plan: The family requests that the patient be placed in SNF again at time of d/c. SW aware.  Most likely on Monday or Tuesday if ok with GI. Please see above.   Consultants:  GI, Dr. Dulce Sellar  Procedures:  None  Antibiotics:  Ceftriaxone 12/22 >>   HPI/Subjective: Patient has no new complaints today.  Mentions that she feels better no acute issues reported and patient mentions that she has been tolerating her  oral intake.   Objective: Filed Vitals:   07/18/12 0949 07/18/12 1446 07/18/12 2150 07/19/12 0641  BP: 118/52 117/57 130/77 114/61  Pulse: 90 76 74 70  Temp:  98.8 F (37.1 C) 99.3 F (37.4 C) 98.5 F (36.9 C)  TempSrc:  Oral Oral Oral  Resp:  20 24 28   Height:      Weight:      SpO2:  97% 94% 95%    Intake/Output Summary (Last 24 hours) at 07/19/12 1033 Last data filed at 07/19/12 0634  Gross per 24 hour  Intake    480 ml  Output    500 ml  Net    -20 ml    Filed Weights   07/12/12 1540 07/13/12 1615 07/15/12 0535  Weight: 63.277 kg (139 lb 8 oz) 66.1 kg (145 lb 11.6 oz) 67.4 kg (148 lb 9.4 oz)    Exam:  General: Caucasian female, NAD  ENT:  MMM. No masses on visual inspection Cardiovascular: RRR. 1/6 murmur at LLSB. No rubs or gallops. 2+ pulses.  Respiratory:  No increased WOB.  + wheeze, no rales or rhonchi.   Abdomen:  Normal active bowel sounds. Soft, ND, nontender. No organomegaly.  Neuro:  Patient answers questions appropriately, moves all extremities Musculoskeletal: no cyanosis or clubbing.   Data Reviewed: Basic Metabolic Panel:  Lab 07/19/12 1610 07/18/12 0625 07/17/12 0542 07/16/12 0605 07/15/12 0629  NA 136 135 140 137 138  K 3.6 3.5 3.5 3.1* 3.0*  CL 102 102 106 102 102  CO2 25 22 23 24 24   GLUCOSE 93 93 100* 105* 102*  BUN 13 11 10 11 12   CREATININE 1.02 0.96 1.01 1.09 1.05  CALCIUM 8.8 8.9 8.7 8.7 8.7  MG -- -- -- -- --  PHOS -- -- -- -- --   Liver Function Tests:  Lab 07/12/12 1127  AST 50*  ALT 19  ALKPHOS 90  BILITOT 0.2*  PROT 6.7  ALBUMIN 3.0*   No results found for this basename: LIPASE:5,AMYLASE:5 in the last 168 hours No results found for this basename: AMMONIA:5 in the last 168 hours CBC:  Lab 07/19/12 0549 07/18/12 0625 07/17/12 0542 07/16/12 0605 07/15/12 0629 07/12/12 1127  WBC 8.6 10.0 10.8* 10.1 9.6 --  NEUTROABS -- -- -- -- -- 15.2*  HGB 9.2* 10.5* 10.4* 10.8* 10.8* --  HCT 28.7* 31.7* 31.6* 32.4* 32.7* --  MCV 84.2 84.3 84.3 83.9 83.4 --  PLT 292 275 289 289 284 --   Cardiac Enzymes: No results found for this basename: CKTOTAL:5,CKMB:5,CKMBINDEX:5,TROPONINI:5 in the last 168 hours BNP (last 3 results)  Basename 07/13/12 1941 11/21/11 1040  PROBNP 4182.0* 140.0*   CBG: No results found for this basename: GLUCAP:5 in the last 168 hours  Recent Results (from the past 240 hour(s))  MRSA PCR SCREENING     Status: Normal   Collection Time   07/12/12  4:12 PM      Component  Value Range Status Comment   MRSA by PCR NEGATIVE  NEGATIVE Final   URINE CULTURE     Status: Normal   Collection Time   07/12/12  9:24 PM      Component Value Range Status Comment   Specimen Description URINE, CLEAN CATCH   Final    Special Requests CX ADDED AT 2144 ON [id number]   Final    Culture  Setup Time 07/13/2012 02:16   Final    Colony Count >=100,000 COLONIES/ML   Final    Culture ESCHERICHIA  COLI   Final    Report Status 07/15/2012 FINAL   Final    Organism ID, Bacteria ESCHERICHIA COLI   Final      Studies: No results found.  Scheduled Meds:    . amitriptyline  50 mg Oral QHS  . amLODipine  5 mg Oral Daily  . cefTRIAXone (ROCEPHIN)  IV  1 g Intravenous Q24H  . cloNIDine  0.1 mg Transdermal Weekly  . docusate sodium  100 mg Oral BID  . feeding supplement  237 mL Oral BID BM  . furosemide  40 mg Oral Daily  . influenza  inactive virus vaccine  0.5 mL Intramuscular Tomorrow-1000  . irbesartan  300 mg Oral Daily  . metoCLOPramide  5 mg Oral TID AC & HS  . metoprolol succinate  100 mg Oral Daily  . polyethylene glycol  17 g Oral Daily  . prednisoLONE acetate  1 drop Both Eyes Daily  . sodium chloride  3 mL Intravenous Q12H  . sucralfate  1 g Oral TID WC & HS  . topiramate  25 mg Oral QHS   Continuous Infusions:    . pantoprozole (PROTONIX) infusion 8 mg/hr (07/19/12 0223)    Principal Problem:  *GI bleed Active Problems:  CAD (coronary artery disease)  Hypertension  Hyperlipidemia  GERD (gastroesophageal reflux disease)  Acute blood loss anemia  Acute kidney injury  Leukocytosis  Pain in both feet    Time spent: 30    Penny Pia  [facility] Pager [phone] If 8PM-8AM, please contact night-coverage at [email or web address], password [facility] 07/19/2012, 10:33 AM  LOS: 7 days

## 2012-07-20 DIAGNOSIS — K2210 Ulcer of esophagus without bleeding: Secondary | ICD-10-CM

## 2012-07-20 DIAGNOSIS — K208 Other esophagitis without bleeding: Principal | ICD-10-CM

## 2012-07-20 LAB — CBC
Platelets: 325 10*3/uL (ref 150–400)
RDW: 15.1 % (ref 11.5–15.5)
WBC: 9.9 10*3/uL (ref 4.0–10.5)

## 2012-07-20 MED ORDER — PANTOPRAZOLE SODIUM 40 MG IV SOLR
40.0000 mg | Freq: Two times a day (BID) | INTRAVENOUS | Status: DC
  Administered 2012-07-20 (×2): 40 mg via INTRAVENOUS
  Filled 2012-07-20 (×4): qty 40

## 2012-07-20 NOTE — Progress Notes (Signed)
Patient ID: [name], female   DOB: 16-Jan-1919, 78 y.o.   MRN: [id number] [facility] Gastroenterology Progress Note  [name] 78 y.o. 03/21/21   Subjective: Patient sitting up in bed sleeping but easily arousable. Reports swallowing better with a lot less pain when she eats. Wonders about her feet. Wants to know when she can go home.  Objective: Vital signs: Filed Vitals:   07/20/12 0559  BP: 157/81  Pulse: 77  Temp: 98.1 F (36.7 C)  Resp: 17    Physical Exam: Gen: alert, no acute distress  Abd: soft, nontender, nondistended  Lab Results:  Basename 07/19/12 0549 07/18/12 0625  NA 136 135  K 3.6 3.5  CL 102 102  CO2 25 22  GLUCOSE 93 93  BUN 13 11  CREATININE 1.02 0.96  CALCIUM 8.8 8.9  MG -- --  PHOS -- --   No results found for this basename: AST:2,ALT:2,ALKPHOS:2,BILITOT:2,PROT:2,ALBUMIN:2 in the last 72 hours  Basename 07/20/12 0730 07/19/12 0549  WBC 9.9 8.6  NEUTROABS -- --  HGB 9.8* 9.2*  HCT 31.0* 28.7*  MCV 84.9 84.2  PLT 325 292      Assessment/Plan: 78 yo with severe erosive esophagitis that seems to be improving clinically. Will d/c Reglan. Continue PPI IV Q 12 hours until close to discharge and then change to PO BID. Continue Carafate indefinitely. Ok to d/c from GI standpoint. Would not recommend a repeat EGD this admit. F/U with Dr. Matthias Hughs in 1-2 months. Will sign off. Call us back if needed.    [name] C. 07/20/2012, 10:15 AM  a

## 2012-07-20 NOTE — Progress Notes (Signed)
[facility] PROGRESS NOTE  [name] ZOX:[id number] DOB: 1918-08-27 DOA: 07/12/2012 PCP: Tandy Gaw, PA-C  Assessment/Plan: GI bleed -EGD demonstrated worsening of erosive esophagitis -Continue IV Protonix until the time of discharge -Carafate indefinitely -Appreciate GI followup -Reglan has been discontinued -Elevate head of bed 2 hours postmeal -cbc in am Hypertension -Continue amlodipine, clonidine TTS-1 patch, Toprol-XL Acute kidney injury -Serum creatinine back to baseline Escherichia coli UTI -Finished 7 days of ceftriaxone Dyspnea -Resolved with furosemide -Remained stable on room air Acute blood loss anemia -Status post transfusion 2u PRBCs -Hemoglobin has remained stable    Disposition Plan:   [facility] 07/21/12     Procedures/Studies: Dg Ribs Unilateral W/chest Right  06/21/2012  *RADIOLOGY REPORT*  Clinical Data: Right-sided chest pain.  Shortness of breath. Generalized weakness.  RIGHT RIBS AND CHEST - 3+ VIEW  Comparison: Two-view chest x-ray 05/14/2012, 11/21/2011.  No prior rib imaging.  Findings: No fractures identified involving the right ribs. Osteopenia.  Costal cartilage calcification.  Cardiac silhouette upper normal in size to slightly enlarged but stable.  Thoracic aorta atherosclerotic, unchanged.  Hilar and mediastinal contours otherwise unremarkable.  Changes of pulmonary fibrosis in both lungs, right greater than left, with pleuroparenchymal scarring at both lung bases and parenchymal scarring in the right upper lobe, unchanged.  No new pulmonary parenchymal abnormalities.  IMPRESSION:  1.  No right rib fractures identified. 2.  No acute cardiopulmonary disease.  Stable changes of pulmonary fibrosis, pleuroparenchymal scarring at the lung bases, and parenchymal scarring in the right upper lobe.   Original Report Authenticated By: Hulan Saas, M.D.    Ct Abdomen Pelvis W Contrast  06/21/2012  *RADIOLOGY REPORT*  Clinical Data:  .  Abdominal pain and diarrhea.  CT ABDOMEN AND PELVIS WITH CONTRAST  Technique:  Multidetector CT imaging of the abdomen and pelvis was performed following the standard protocol during bolus administration of intravenous contrast.  Contrast: OMNIPAQUE IOHEXOL 300 MG/ML  SOLN  Comparison: None.  Findings: Pulmonary scarring in both lung bases.  Small right pleural effusion.  Liver and bile ducts are normal.  Gallbladder has been removed. The pancreas is atrophic.  The spleen is normal.  Kidneys show no obstruction or mass.  2.5 cm right renal cyst.  No renal calculi. There is liquid stool in the colon.  Small bowel is not dilated. No free fluid in the abdomen.  No mass or adenopathy.  No acute bony abnormality.  IMPRESSION: Bibasilar lung scarring.  Small right pleural effusion.  Liquid stool in the colon may represent gastroenteritis.  No bowel obstruction.  Sigmoid diverticulosis without diverticulitis.   Original Report Authenticated By: Janeece Riggers, M.D.    Dg Chest Port 1 View  07/12/2012  *RADIOLOGY REPORT*  Clinical Data: Hematemesis.  Chest pain.  Current history of hypertension and bronchiectasis.  PORTABLE CHEST - 1 VIEW 07/12/2012 1143 hours:  Comparison: One-view chest x-ray 06/21/2012.  Two-view chest x-ray 05/14/2012, 11/21/2011.  Findings: Prior sternotomy.  Cardiac silhouette enlarged but stable.  Thoracic aorta atherosclerotic, unchanged.  Prominent central pulmonary arteries, unchanged.  Emphysematous changes in the upper lobes, unchanged.  Pulmonary fibrosis bilaterally, right greater than left, unchanged.  No new pulmonary parenchymal abnormalities.  IMPRESSION: Stable cardiomegaly without pulmonary edema.  Stable COPD/emphysema and pulmonary fibrosis.  No acute cardiopulmonary disease.   Original Report Authenticated By: Hulan Saas, M.D.          Subjective:  patient with mild odynophagia but improving. Denies any fevers, chills, chest pain, shortness breath, nausea,  vomiting, diarrhea, abdominal pain, dysuria, hematuria.  Objective: Filed Vitals:   07/19/12 1503 07/19/12 2046 07/20/12 0559 07/20/12 1510  BP: 138/68 133/71 157/81 145/63  Pulse: 72 78 77 66  Temp: 98.3 F (36.8 C) 98.2 F (36.8 C) 98.1 F (36.7 C) 98.6 F (37 C)  TempSrc: Oral Oral Oral   Resp: 20 18 17 18   Height:      Weight:      SpO2: 95% 96% 100% 93%    Intake/Output Summary (Last 24 hours) at 07/20/12 1712 Last data filed at 07/20/12 1511  Gross per 24 hour  Intake    700 ml  Output    450 ml  Net    250 ml   Weight change:  Exam:   General:  Pt is alert, follows commands appropriately, not in acute distress  HEENT: No icterus, No thrush, No neck mass, [place]/AT  Cardiovascular: RRR, S1/S2, no rubs, no gallops  Respiratory: bibasilar crackles. No wheezes or rhonchi. Good air movement.  Abdomen: Soft/+BS, non tender, non distended, no guarding  Extremities: 1+ edema, No lymphangitis, No petechiae, No rashes, no synovitis  Data Reviewed: Basic Metabolic Panel:  Lab 07/19/12 1610 07/18/12 0625 07/17/12 0542 07/16/12 0605 07/15/12 0629  NA 136 135 140 137 138  K 3.6 3.5 3.5 3.1* 3.0*  CL 102 102 106 102 102  CO2 25 22 23 24 24   GLUCOSE 93 93 100* 105* 102*  BUN 13 11 10 11 12   CREATININE 1.02 0.96 1.01 1.09 1.05  CALCIUM 8.8 8.9 8.7 8.7 8.7  MG -- -- -- -- --  PHOS -- -- -- -- --   Liver Function Tests: No results found for this basename: AST:5,ALT:5,ALKPHOS:5,BILITOT:5,PROT:5,ALBUMIN:5 in the last 168 hours No results found for this basename: LIPASE:5,AMYLASE:5 in the last 168 hours No results found for this basename: AMMONIA:5 in the last 168 hours CBC:  Lab 07/20/12 0730 07/19/12 0549 07/18/12 0625 07/17/12 0542 07/16/12 0605  WBC 9.9 8.6 10.0 10.8* 10.1  NEUTROABS -- -- -- -- --  HGB 9.8* 9.2* 10.5* 10.4* 10.8*  HCT 31.0* 28.7* 31.7* 31.6* 32.4*  MCV 84.9 84.2 84.3 84.3 83.9  PLT 325 292 275 289 289   Cardiac Enzymes: No results found for  this basename: CKTOTAL:5,CKMB:5,CKMBINDEX:5,TROPONINI:5 in the last 168 hours BNP: No components found with this basename: POCBNP:5 CBG: No results found for this basename: GLUCAP:5 in the last 168 hours  Recent Results (from the past 240 hour(s))  MRSA PCR SCREENING     Status: Normal   Collection Time   07/12/12  4:12 PM      Component Value Range Status Comment   MRSA by PCR NEGATIVE  NEGATIVE Final   URINE CULTURE     Status: Normal   Collection Time   07/12/12  9:24 PM      Component Value Range Status Comment   Specimen Description URINE, CLEAN CATCH   Final    Special Requests CX ADDED AT 2144 ON [id number]   Final    Culture  Setup Time 07/13/2012 02:16   Final    Colony Count >=100,000 COLONIES/ML   Final    Culture ESCHERICHIA COLI   Final    Report Status 07/15/2012 FINAL   Final    Organism ID, Bacteria ESCHERICHIA COLI   Final      Scheduled Meds:   . amitriptyline  50 mg Oral QHS  . amLODipine  5 mg Oral Daily  . cloNIDine  0.1 mg Transdermal Weekly  .  docusate sodium  100 mg Oral BID  . feeding supplement  237 mL Oral BID BM  . furosemide  40 mg Oral Daily  . irbesartan  300 mg Oral Daily  . metoprolol succinate  100 mg Oral Daily  . pantoprazole (PROTONIX) IV  40 mg Intravenous Q12H  . polyethylene glycol  17 g Oral Daily  . prednisoLONE acetate  1 drop Both Eyes Daily  . sodium chloride  3 mL Intravenous Q12H  . sucralfate  1 g Oral TID WC & HS  . topiramate  25 mg Oral QHS   Continuous Infusions:    Bethany Cumming, DO  [facility] Pager [phone]  If 7PM-7AM, please contact night-coverage [email or web address] Password [email or web address] 07/20/2012, 5:12 PM   LOS: 8 days

## 2012-07-21 LAB — CBC
Hemoglobin: 9.9 g/dL — ABNORMAL LOW (ref 12.0–15.0)
MCHC: 33.1 g/dL (ref 30.0–36.0)
RBC: 3.52 MIL/uL — ABNORMAL LOW (ref 3.87–5.11)

## 2012-07-21 MED ORDER — DSS 100 MG PO CAPS: 100.0000 mg | ORAL_CAPSULE | Freq: Two times a day (BID) | ORAL | Status: DC

## 2012-07-21 MED ORDER — BISACODYL 10 MG RE SUPP: 10.0000 mg | Freq: Every day | RECTAL | Status: DC | PRN

## 2012-07-21 NOTE — Progress Notes (Signed)
Occupational Therapy Treatment Patient Details Name: [name] MRN: [id number] DOB: Dec 07, 1918 Today's Date: 07/21/2012 Time: 4098-1191 OT Time Calculation (min): 26 min  OT Assessment / Plan / Recommendation Comments on Treatment Session Pt with less pain in her feet today with mobility.  Feel she will continue to need SNF level rehab at discharge with plans to discharge later today.    Follow Up Recommendations  SNF             Frequency Min 2X/week   Plan Discharge plan remains appropriate    Precautions / Restrictions Precautions Precautions: Fall Restrictions Weight Bearing Restrictions: No   Pertinent Vitals/Pain Pain 3/10 in her feet with mobility, O2 sats 96% on room air.    ADL  Grooming: Performed;Minimal assistance;Wash/dry face;Denture care;Brushing hair Where Assessed - Grooming: Supported standing Toilet Transfer: Mining engineer Method: Other (comment) (RW from bed to bedside chair) Toilet Transfer Equipment: Other (comment) (simulated to bedside chair.  Pt declined need to toilet.) Toileting - Clothing Manipulation and Hygiene: Simulated;Minimal assistance Where Assessed - Engineer, mining and Hygiene: Other (comment) (sit to stand from EOB) Equipment Used: Rolling walker Transfers/Ambulation Related to ADLs: Pt overall needed min assist for mobility using the RW.  Mod instructional cueing for posture and to stay inside of the walker. ADL Comments: Pt demonstrates forward trunk flexion in standing.  Needs mod instructional cueing for hand placement with sit to stand transitions.  Able to tolerate standing for 6 mins at the sink before needing a rest break.      OT Goals ADL Goals ADL Goal: Grooming - Progress: Progressing toward goals ADL Goal: Toilet Transfer - Progress: Progressing toward goals ADL Goal: Toileting - Clothing Manipulation - Progress: Progressing toward goals ADL Goal: Toileting - Hygiene -  Progress: Progressing toward goals  Visit Information  Last OT Received On: 07/21/12 Assistance Needed: +1    Subjective Data  Subjective: I havent been able to walk for a few days. Patient Stated Goal: Go to SNF later today.   Prior Functioning       Cognition  Overall Cognitive Status: Appears within functional limits for tasks assessed/performed Arousal/Alertness: Awake/alert Orientation Level: Appears intact for tasks assessed Behavior During Session: [facility] for tasks performed    Mobility  Shoulder Instructions Bed Mobility Bed Mobility: Supine to Sit Rolling Right: 5: Supervision;With rail Sit to Supine: 4: Min assist Transfers Transfers: Sit to Stand Sit to Stand: With upper extremity assist;From bed;4: Min assist Stand to Sit: 4: Min assist;Without upper extremity assist          Balance Static Standing Balance Static Standing - Balance Support: Left upper extremity supported Static Standing - Level of Assistance: 4: Min assist Dynamic Standing Balance Dynamic Standing - Balance Support: Right upper extremity supported;Left upper extremity supported Dynamic Standing - Level of Assistance: 4: Min assist   End of Session OT - End of Session Equipment Utilized During Treatment: Gait belt Activity Tolerance: Patient tolerated treatment well Patient left: in chair;with call bell/phone within reach;with family/visitor present     Marcile Fuquay OTR/L Pager number [phone] 07/21/2012, 10:08 AM

## 2012-07-21 NOTE — Progress Notes (Signed)
NUTRITION FOLLOW UP  Intervention:   Continue Ensure Complete BID after d/c until appetite returns to normal   Nutrition Dx:   Inadequate oral intake related to decreased appetite as evidenced by meal completion < 50%.    Goal:   PO intake to meet >/=90% estimated nutrition needs. Likely met.   Monitor:   PO intake, weight trends   Assessment:   Pt states her appetite is better, but not yet at baseline r/t "laying in the bed all day." Expects return of appetite with increased activity. Pt expects to go to a rehab facility today.   Height: Ht Readings from Last 1 Encounters:  07/13/12 5' (1.524 m)    Weight Status:   Wt Readings from Last 1 Encounters:  07/15/12 148 lb 9.4 oz (67.4 kg)  Trending up   Re-estimated needs:  Kcal: 1300-1450 Protein: 65-75 gm  Fluid: > 1.5 L/day  Skin: intact   Diet Order: Cardiac   Intake/Output Summary (Last 24 hours) at 07/21/12 0938 Last data filed at 07/21/12 0916  Gross per 24 hour  Intake    240 ml  Output      0 ml  Net    240 ml    Last BM: 12/29   Labs:   Lab 07/19/12 0549 07/18/12 0625 07/17/12 0542  NA 136 135 140  K 3.6 3.5 3.5  CL 102 102 106  CO2 25 22 23   BUN 13 11 10   CREATININE 1.02 0.96 1.01  CALCIUM 8.8 8.9 8.7  MG -- -- --  PHOS -- -- --  GLUCOSE 93 93 100*    CBG (last 3)  No results found for this basename: GLUCAP:3 in the last 72 hours  Scheduled Meds:   . amitriptyline  50 mg Oral QHS  . amLODipine  5 mg Oral Daily  . cloNIDine  0.1 mg Transdermal Weekly  . docusate sodium  100 mg Oral BID  . feeding supplement  237 mL Oral BID BM  . furosemide  40 mg Oral Daily  . irbesartan  300 mg Oral Daily  . metoprolol succinate  100 mg Oral Daily  . pantoprazole (PROTONIX) IV  40 mg Intravenous Q12H  . polyethylene glycol  17 g Oral Daily  . prednisoLONE acetate  1 drop Both Eyes Daily  . sodium chloride  3 mL Intravenous Q12H  . sucralfate  1 g Oral TID WC & HS  . topiramate  25 mg Oral QHS      Continuous Infusions:   Clarene Duke RD, LDN Pager [phone] After Hours pager [phone]

## 2012-07-21 NOTE — Progress Notes (Signed)
Patient for d/c today to SNF bed at [facility]- daughter and patient agreeable to this plan- plan transfer via car-  Reece Levy, MSW, [employer] [phone]

## 2012-07-21 NOTE — Care Management Note (Signed)
    Page 1 of 1   07/21/2012     11:34:06 AM   CARE MANAGEMENT NOTE 07/21/2012  Patient:  [name], [name]   Account Number:  [id number]  Date Initiated:  07/21/2012  Documentation initiated by:  Letha Cape  Subjective/Objective Assessment:   dx gib  admit     Action/Plan:   pt/ot eval   Anticipated DC Date:  07/21/2012   Anticipated DC Plan:  SKILLED NURSING FACILITY  In-house referral  Clinical Social Worker      DC Planning Services  CM consult      Choice offered to / List presented to:             Status of service:  Completed, signed off Medicare Important Message given?   (If response is "NO", the following Medicare IM given date fields will be blank) Date Medicare IM given:   Date Additional Medicare IM given:    Discharge Disposition:  SKILLED NURSING FACILITY  Per UR Regulation:  Reviewed for med. necessity/level of care/duration of stay  If discussed at Long Length of Stay Meetings, dates discussed:    Comments:  12/30!3 11:33 Letha Cape RN, BSN [phone] patient is for dc today to [facility], CSW following.

## 2012-07-21 NOTE — Discharge Summary (Signed)
Physician Discharge Summary  [name] WGN:[id number] DOB: 05-Sep-1918 DOA: 07/12/2012  PCP: Tandy Gaw, PA-C  Admit date: 07/12/2012 Discharge date: 07/21/2012  Recommendations for Outpatient Follow-up:  1. Pt will need to follow up with PCP in 2 weeks post discharge 2. Follow up with Gastroenterology, Dr. Bernette Redbird in 1 month--[phone]  Discharge Diagnoses:  GI bleed  -EGD demonstrated worsening of erosive esophagitis  -Continue IV Protonix until the time of discharge  -Carafate indefinitely  -Appreciate GI followup  -Reglan has been discontinued  -Elevate head of bed 2 hours postmeal  -cbc in am  Hypertension  -Continue amlodipine, clonidine TTS-1 patch, Toprol-XL, ARB Acute kidney injury  -Serum creatinine back to baseline  Escherichia coli UTI  -Finished 7 days of ceftriaxone  Dyspnea  -Resolved with furosemide  -Remained stable on room air  -Continue furosemide 40 mg by mouth daily Acute blood loss anemia  -Status post transfusion 2u PRBCs  -Hemoglobin has remained stable   Discharge Condition: stable  Disposition: d/c to [facility]  Diet:  cardiac Wt Readings from Last 3 Encounters:  07/15/12 67.4 kg (148 lb 9.4 oz)  07/15/12 67.4 kg (148 lb 9.4 oz)  06/24/12 64 kg (141 lb 1.5 oz)    History of present illness:  78 yo F with HTN, HLD, OSA, CAD s/p CABG, bioprosthetic aortic valve replacement, GERD, PUD, scleroderma, and recurrent UGI bleeds, who was recently admitted for UGI bleed. In November, she presented with hematemesis and EGD demonstrated ulcerative esophagitis. She was started on BID PPI and QID sucralfate and discharged to [facility], where she has lived up until yesterday. She presents again with blood emesis and melena.  The patient states that she was feeling well until 2-3 days prior to admission, when she became fatigued and mildly short of breath. She does not routinely look at her stools and is unsure if she has had any black  or dark/tarry stools during that time. She was taken home by her daughter on the day prior to admission. She was feeling lightheaded and weak and cold. In the middle of the night, she woke up and felt sick to her stomach and vomited a small amount of dark blood. She was also incontinent with a large melenotic stool. An hour later, she had a second large melenotic stool. Her family got her ready to come to the ED this morning, and on their way to the front door, she vomited several cups of bright red blood. Her family called EMS and she was transported to the ED.  In the ED, her vital signs were stable. Her hemoglobin was higher than it was at d/c a few weeks ago. The ED staff attempted to place an NGT several times but were unsuccessful. The patient was seen by GI in the ED, who recommended a clear liquid diet, IV Protonix and admission for observation.  Sore throat since. She has been taking the carafate 1gm four times daily and protonix 40mg  by mouth BID. She has been feeling tired and drained for the last two to three days   Hospital Course:  At the time of admission, the patient's hemoglobin was noted to be 8.2. On the following day, her hemoglobin dropped to 7.4. She was transfused 2 units of packed red blood cells. GI was consulted to see the patient. The patient was continued on intravenous Protonix as well as Carafate. EGD was performed on 07/14/2012 which showed significant erosive esophagitis without active bleeding. He was more extensive than noted on her  endoscopy 3 weeks prior to this admission. This likely accounted for her recent episode of hematemesis. She was continued on intravenous protonix drip for several days after her EGD. She was also continued on Carafate. Recommendations were also given to maintain the patient in reverse and lumbar with the head elevated at 30 at all times especially 2 hours postmeal. The patient was also started on low-dose metoclopramide on 07/14/2012. The patient  was a was switched over to intermittent dosing of Protonix on 07/20/2012. Reglan was also discontinued on 07/20/2012. The patient continued to tolerate her diet with intermittent dysphagia. GI recommended IV Protonix until the time of discharge after which she will continue on Protonix 40 mg by mouth twice a day and Carafate indefinitely. She was instructed to followup with Dr. Marjorie Smolder in one to 2 months.  Initially, the patient's blood pressures were low, and her antihypertensive medications were held. As her blood pressure improved, the patient's amlodipine, clonidine, and metoprolol were restarted. The patient's serum creatinine was noted to be 1.27 at the time of admission. This was thought to be partly due to the patient's hypovolemia from her acute blood loss. The patient was initially given intravenous fluids and PRBCs. Her serum creatinine improved back to baseline. On the day of discharge, the patient's hemoglobin remained stable at 9.9. The patient was noted to have some shortness of breath shortly after admission. Her proBNP was elevated at 4182. The patient was started back on her furosemide with improvement of her dyspnea. Incentive spirometry was ordered. The patient was also noted to have pyuria at the time of admission. Urine cultures were obtained and showed Escherichia coli sensitive to ceftriaxone. The patient finished 7 days of intravenous ceftriaxone without complications. The patient's aspirin and Plavix were held due to her recurrent upper GI bleed. She was continued on her beta blocker and her ARB for her coronary artery disease. Physical therapy evaluated and treated the patient. They recommended skilled nursing facility. Social work was consulted. Placement was recommended at [facility]. The patient remained hemodynamically stable with a stable hemoglobin and renal function. Patient's daughter was in agreement that the patient be discharged for physical therapy at skilled nursing  facility.  Consultants: [facility]  Discharge Exam: Filed Vitals:   07/21/12 0850  BP:   Pulse: 65  Temp:   Resp:    Filed Vitals:   07/20/12 1510 07/20/12 2142 07/21/12 0500 07/21/12 0850  BP: 145/63 145/58 112/54   Pulse: 66 69 57 65  Temp: 98.6 F (37 C) 99.4 F (37.4 C) 98.5 F (36.9 C)   TempSrc:  Oral Oral   Resp: 18 20 18    Height:      Weight:      SpO2: 93% 100% 94% 96%   General: A&O x 3, NAD, pleasant, cooperative Cardiovascular: RRR, no rub, no gallop, no S3 Respiratory: Bibasilar crackles. No wheezes or rhonchi. Abdomen appeared Abdomen:soft, nontender, nondistended, positive bowel sounds Extremities: 1+ edema, No lymphangitis, no petechiae  Discharge Instructions      Discharge Orders    Future Appointments: Provider: Department: Dept Phone: Center:   07/25/2012 11:00 AM Jomarie Longs, PA-C [facility] [phone] None     Future Orders Please Complete By Expires   Diet - low sodium heart healthy      Increase activity slowly          Medication List     As of 07/21/2012 10:35 AM    TAKE these  medications         amitriptyline 50 MG tablet   Commonly known as: ELAVIL   Take 50 mg by mouth at bedtime.      amLODipine 5 MG tablet   Commonly known as: NORVASC   Take 1 tablet (5 mg total) by mouth daily.      bisacodyl 10 MG suppository   Commonly known as: DULCOLAX   Place 1 suppository (10 mg total) rectally daily as needed.      cloNIDine 0.1 mg/24hr patch   Commonly known as: CATAPRES - Dosed in mg/24 hr   Place 1 patch onto the skin once a week. Thursday      DSS 100 MG Caps   Take 100 mg by mouth 2 (two) times daily.      feeding supplement Liqd   Take 1 Container by mouth 3 (three) times daily between meals.      furosemide 40 MG tablet   Commonly known as: LASIX   Take 1 tablet (40 mg total) by mouth daily.      ipratropium-albuterol 0.5-2.5 (3) MG/3ML Soln   Commonly known as: DUONEB     Take 3 mLs by nebulization every 4 (four) hours as needed.      Melatonin 10 MG Tabs   Take 1 tablet by mouth at bedtime.      metoprolol succinate 100 MG 24 hr tablet   Commonly known as: TOPROL-XL   Take 1 tablet (100 mg total) by mouth daily. Take with or immediately following a meal.      ondansetron 4 MG disintegrating tablet   Commonly known as: ZOFRAN-ODT   Take 1 tablet (4 mg total) by mouth every 8 (eight) hours as needed for nausea.      pantoprazole 40 MG tablet   Commonly known as: PROTONIX   Take 1 tablet (40 mg total) by mouth 2 (two) times daily before a meal.      prednisoLONE acetate 1 % ophthalmic suspension   Commonly known as: PRED FORTE   Place 1 drop into both eyes daily.      sucralfate 1 GM/10ML suspension   Commonly known as: CARAFATE   Take 10 mLs (1 g total) by mouth 4 (four) times daily -  with meals and at bedtime.      topiramate 25 MG tablet   Commonly known as: TOPAMAX   Take 1 tablet (25 mg total) by mouth at bedtime.      traMADol 50 MG tablet   Commonly known as: ULTRAM   Take 1 tablet (50 mg total) by mouth every 8 (eight) hours as needed.      valsartan 320 MG tablet   Commonly known as: DIOVAN   Take 1 tablet (320 mg total) by mouth daily.           The results of significant diagnostics from this hospitalization (including imaging, microbiology, ancillary and laboratory) are listed below for reference.    Significant Diagnostic Studies: Dg Ribs Unilateral W/chest Right  06/21/2012  *RADIOLOGY REPORT*  Clinical Data: Right-sided chest pain.  Shortness of breath. Generalized weakness.  RIGHT RIBS AND CHEST - 3+ VIEW  Comparison: Two-view chest x-ray 05/14/2012, 11/21/2011.  No prior rib imaging.  Findings: No fractures identified involving the right ribs. Osteopenia.  Costal cartilage calcification.  Cardiac silhouette upper normal in size to slightly enlarged but stable.  Thoracic aorta atherosclerotic, unchanged.  Hilar and  mediastinal contours otherwise unremarkable.  Changes of pulmonary fibrosis in  both lungs, right greater than left, with pleuroparenchymal scarring at both lung bases and parenchymal scarring in the right upper lobe, unchanged.  No new pulmonary parenchymal abnormalities.  IMPRESSION:  1.  No right rib fractures identified. 2.  No acute cardiopulmonary disease.  Stable changes of pulmonary fibrosis, pleuroparenchymal scarring at the lung bases, and parenchymal scarring in the right upper lobe.   Original Report Authenticated By: Hulan Saas, M.D.    Ct Abdomen Pelvis W Contrast  06/21/2012  *RADIOLOGY REPORT*  Clinical Data: .  Abdominal pain and diarrhea.  CT ABDOMEN AND PELVIS WITH CONTRAST  Technique:  Multidetector CT imaging of the abdomen and pelvis was performed following the standard protocol during bolus administration of intravenous contrast.  Contrast: OMNIPAQUE IOHEXOL 300 MG/ML  SOLN  Comparison: None.  Findings: Pulmonary scarring in both lung bases.  Small right pleural effusion.  Liver and bile ducts are normal.  Gallbladder has been removed. The pancreas is atrophic.  The spleen is normal.  Kidneys show no obstruction or mass.  2.5 cm right renal cyst.  No renal calculi. There is liquid stool in the colon.  Small bowel is not dilated. No free fluid in the abdomen.  No mass or adenopathy.  No acute bony abnormality.  IMPRESSION: Bibasilar lung scarring.  Small right pleural effusion.  Liquid stool in the colon may represent gastroenteritis.  No bowel obstruction.  Sigmoid diverticulosis without diverticulitis.   Original Report Authenticated By: Janeece Riggers, M.D.    Dg Chest Port 1 View  07/12/2012  *RADIOLOGY REPORT*  Clinical Data: Hematemesis.  Chest pain.  Current history of hypertension and bronchiectasis.  PORTABLE CHEST - 1 VIEW 07/12/2012 1143 hours:  Comparison: One-view chest x-ray 06/21/2012.  Two-view chest x-ray 05/14/2012, 11/21/2011.  Findings: Prior sternotomy.   Cardiac silhouette enlarged but stable.  Thoracic aorta atherosclerotic, unchanged.  Prominent central pulmonary arteries, unchanged.  Emphysematous changes in the upper lobes, unchanged.  Pulmonary fibrosis bilaterally, right greater than left, unchanged.  No new pulmonary parenchymal abnormalities.  IMPRESSION: Stable cardiomegaly without pulmonary edema.  Stable COPD/emphysema and pulmonary fibrosis.  No acute cardiopulmonary disease.   Original Report Authenticated By: Hulan Saas, M.D.      Microbiology: Recent Results (from the past 240 hour(s))  MRSA PCR SCREENING     Status: Normal   Collection Time   07/12/12  4:12 PM      Component Value Range Status Comment   MRSA by PCR NEGATIVE  NEGATIVE Final   URINE CULTURE     Status: Normal   Collection Time   07/12/12  9:24 PM      Component Value Range Status Comment   Specimen Description URINE, CLEAN CATCH   Final    Special Requests CX ADDED AT 2144 ON 161096   Final    Culture  Setup Time 07/13/2012 02:16   Final    Colony Count >=100,000 COLONIES/ML   Final    Culture ESCHERICHIA COLI   Final    Report Status 07/15/2012 FINAL   Final    Organism ID, Bacteria ESCHERICHIA COLI   Final      Labs: Basic Metabolic Panel:  Lab 07/19/12 0454 07/18/12 0625 07/17/12 0542 07/16/12 0605 07/15/12 0629  NA 136 135 140 137 138  K 3.6 3.5 -- -- --  CL 102 102 106 102 102  CO2 25 22 23 24 24   GLUCOSE 93 93 100* 105* 102*  BUN 13 11 10 11 12   CREATININE 1.02 0.96 1.01  1.09 1.05  CALCIUM 8.8 8.9 8.7 8.7 8.7  MG -- -- -- -- --  PHOS -- -- -- -- --   Liver Function Tests: No results found for this basename: AST:5,ALT:5,ALKPHOS:5,BILITOT:5,PROT:5,ALBUMIN:5 in the last 168 hours No results found for this basename: LIPASE:5,AMYLASE:5 in the last 168 hours No results found for this basename: AMMONIA:5 in the last 168 hours CBC:  Lab 07/21/12 0733 07/20/12 0730 07/19/12 0549 07/18/12 0625 07/17/12 0542  WBC 9.3 9.9 8.6 10.0 10.8*    NEUTROABS -- -- -- -- --  HGB 9.9* 9.8* 9.2* 10.5* 10.4*  HCT 29.9* 31.0* 28.7* 31.7* 31.6*  MCV 84.9 84.9 84.2 84.3 84.3  PLT 331 325 292 275 289   Cardiac Enzymes: No results found for this basename: CKTOTAL:5,CKMB:5,CKMBINDEX:5,TROPONINI:5 in the last 168 hours BNP: No components found with this basename: POCBNP:5 CBG: No results found for this basename: GLUCAP:5 in the last 168 hours  Time coordinating discharge:  Greater than 30 minutes  Signed:  Jamyrah Saur, DO [facility] Pager: [phone] 07/21/2012, 10:35 AM

## 2012-07-21 NOTE — Progress Notes (Signed)
Pt has been dc'd. Awaiting paper work from Child psychotherapist. Daughter is providing ride to facility.

## 2012-07-25 ENCOUNTER — Ambulatory Visit: Payer: Medicare Other | Admitting: Physician Assistant

## 2013-03-12 ENCOUNTER — Ambulatory Visit: Payer: Self-pay | Admitting: Family Medicine

## 2013-12-30 ENCOUNTER — Other Ambulatory Visit: Payer: Self-pay | Admitting: Family Medicine

## 2013-12-30 LAB — CBC WITH DIFFERENTIAL/PLATELET
Basophil #: 0 10*3/uL (ref 0.0–0.1)
Basophil %: 0.3 %
Eosinophil #: 0 10*3/uL (ref 0.0–0.7)
Eosinophil %: 0.1 %
HCT: 34.6 % — AB (ref 35.0–47.0)
HGB: 11.1 g/dL — AB (ref 12.0–16.0)
LYMPHS PCT: 6.6 %
Lymphocyte #: 0.9 10*3/uL — ABNORMAL LOW (ref 1.0–3.6)
MCH: 26.4 pg (ref 26.0–34.0)
MCHC: 32.1 g/dL (ref 32.0–36.0)
MCV: 82 fL (ref 80–100)
MONO ABS: 0.2 x10 3/mm (ref 0.2–0.9)
Monocyte %: 1.4 %
NEUTROS ABS: 12.3 10*3/uL — AB (ref 1.4–6.5)
NEUTROS PCT: 91.6 %
PLATELETS: 385 10*3/uL (ref 150–440)
RBC: 4.21 10*6/uL (ref 3.80–5.20)
RDW: 15.4 % — AB (ref 11.5–14.5)
WBC: 13.5 10*3/uL — ABNORMAL HIGH (ref 3.6–11.0)

## 2013-12-30 LAB — COMPREHENSIVE METABOLIC PANEL
ALK PHOS: 112 U/L
Albumin: 3.1 g/dL — ABNORMAL LOW (ref 3.4–5.0)
Anion Gap: 3 — ABNORMAL LOW (ref 7–16)
BUN: 34 mg/dL — ABNORMAL HIGH (ref 7–18)
Bilirubin,Total: 0.2 mg/dL (ref 0.2–1.0)
CHLORIDE: 105 mmol/L (ref 98–107)
CO2: 28 mmol/L (ref 21–32)
CREATININE: 1.45 mg/dL — AB (ref 0.60–1.30)
Calcium, Total: 9.2 mg/dL (ref 8.5–10.1)
EGFR (African American): 36 — ABNORMAL LOW
EGFR (Non-African Amer.): 31 — ABNORMAL LOW
Glucose: 156 mg/dL — ABNORMAL HIGH (ref 65–99)
OSMOLALITY: 283 (ref 275–301)
Potassium: 4.9 mmol/L (ref 3.5–5.1)
SGOT(AST): 25 U/L (ref 15–37)
SGPT (ALT): 25 U/L (ref 12–78)
SODIUM: 136 mmol/L (ref 136–145)
TOTAL PROTEIN: 7.6 g/dL (ref 6.4–8.2)

## 2013-12-30 LAB — PRO B NATRIURETIC PEPTIDE: B-TYPE NATIURETIC PEPTID: 5280 pg/mL — AB (ref 0–450)

## 2014-09-05 ENCOUNTER — Emergency Department: Payer: Self-pay | Admitting: Student

## 2014-11-03 ENCOUNTER — Inpatient Hospital Stay
Admit: 2014-11-03 | Disposition: A | Discharge status: Skilled Nursing Facility | Payer: Self-pay | Attending: Internal Medicine | Admitting: Internal Medicine

## 2014-11-03 LAB — COMPREHENSIVE METABOLIC PANEL
ALBUMIN: 2.8 g/dL — AB
ANION GAP: 12 (ref 7–16)
Alkaline Phosphatase: 116 U/L
BILIRUBIN TOTAL: 0.8 mg/dL
BUN: 59 mg/dL — ABNORMAL HIGH
CHLORIDE: 100 mmol/L — AB
CREATININE: 2.74 mg/dL — AB
Calcium, Total: 9.3 mg/dL
Co2: 22 mmol/L
EGFR (African American): 17 — ABNORMAL LOW
EGFR (Non-African Amer.): 14 — ABNORMAL LOW
GLUCOSE: 134 mg/dL — AB
Potassium: 3.5 mmol/L
SGOT(AST): 44 U/L — ABNORMAL HIGH
SGPT (ALT): 20 U/L
Sodium: 134 mmol/L — ABNORMAL LOW
Total Protein: 7.4 g/dL

## 2014-11-03 LAB — CK-MB: CK-MB: 18.1 ng/mL — AB

## 2014-11-03 LAB — CBC WITH DIFFERENTIAL/PLATELET
BASOS PCT: 0.1 %
Basophil #: 0 10*3/uL (ref 0.0–0.1)
Eosinophil #: 0 10*3/uL (ref 0.0–0.7)
Eosinophil %: 0 %
HCT: 31.6 % — ABNORMAL LOW (ref 35.0–47.0)
HGB: 10.2 g/dL — ABNORMAL LOW (ref 12.0–16.0)
LYMPHS PCT: 4.4 %
Lymphocyte #: 1.3 10*3/uL (ref 1.0–3.6)
MCH: 26.3 pg (ref 26.0–34.0)
MCHC: 32.2 g/dL (ref 32.0–36.0)
MCV: 82 fL (ref 80–100)
Monocyte #: 1.2 x10 3/mm — ABNORMAL HIGH (ref 0.2–0.9)
Monocyte %: 3.9 %
NEUTROS PCT: 91.6 %
Neutrophil #: 27.1 10*3/uL — ABNORMAL HIGH (ref 1.4–6.5)
PLATELETS: 372 10*3/uL (ref 150–440)
RBC: 3.86 10*6/uL (ref 3.80–5.20)
RDW: 16.7 % — ABNORMAL HIGH (ref 11.5–14.5)
WBC: 29.7 10*3/uL — ABNORMAL HIGH (ref 3.6–11.0)

## 2014-11-03 LAB — PROTIME-INR
INR: 1.3
Prothrombin Time: 16.5 secs — ABNORMAL HIGH

## 2014-11-03 LAB — PHOSPHORUS: PHOSPHORUS: 3.6 mg/dL

## 2014-11-03 LAB — MAGNESIUM: Magnesium: 1.8 mg/dL

## 2014-11-03 LAB — TROPONIN I: Troponin-I: 3.69 ng/mL — ABNORMAL HIGH

## 2014-11-04 ENCOUNTER — Other Ambulatory Visit: Payer: Self-pay | Admitting: Physician Assistant

## 2014-11-04 DIAGNOSIS — R7989 Other specified abnormal findings of blood chemistry: Secondary | ICD-10-CM

## 2014-11-04 DIAGNOSIS — J9601 Acute respiratory failure with hypoxia: Secondary | ICD-10-CM

## 2014-11-04 DIAGNOSIS — I340 Nonrheumatic mitral (valve) insufficiency: Secondary | ICD-10-CM | POA: Diagnosis not present

## 2014-11-04 DIAGNOSIS — N179 Acute kidney failure, unspecified: Secondary | ICD-10-CM

## 2014-11-04 DIAGNOSIS — N183 Chronic kidney disease, stage 3 (moderate): Secondary | ICD-10-CM

## 2014-11-04 LAB — BASIC METABOLIC PANEL
Anion Gap: 9 (ref 7–16)
BUN: 57 mg/dL — ABNORMAL HIGH
CREATININE: 2.34 mg/dL — AB
Calcium, Total: 8.6 mg/dL — ABNORMAL LOW
Chloride: 101 mmol/L
Co2: 24 mmol/L
EGFR (African American): 20 — ABNORMAL LOW
EGFR (Non-African Amer.): 17 — ABNORMAL LOW
GLUCOSE: 97 mg/dL
POTASSIUM: 3.4 mmol/L — AB
Sodium: 134 mmol/L — ABNORMAL LOW

## 2014-11-04 LAB — URINALYSIS, COMPLETE
Bilirubin,UR: NEGATIVE
Blood: NEGATIVE
Glucose,UR: NEGATIVE mg/dL (ref 0–75)
Ketone: NEGATIVE
LEUKOCYTE ESTERASE: NEGATIVE
NITRITE: NEGATIVE
PROTEIN: NEGATIVE
Ph: 5 (ref 4.5–8.0)
SPECIFIC GRAVITY: 1.012 (ref 1.003–1.030)

## 2014-11-04 LAB — CBC WITH DIFFERENTIAL/PLATELET
BASOS PCT: 0.5 %
Basophil #: 0.1 10*3/uL (ref 0.0–0.1)
Eosinophil #: 0 10*3/uL (ref 0.0–0.7)
Eosinophil %: 0 %
HCT: 28.9 % — ABNORMAL LOW (ref 35.0–47.0)
HGB: 9.1 g/dL — ABNORMAL LOW (ref 12.0–16.0)
LYMPHS PCT: 5.1 %
Lymphocyte #: 1.2 10*3/uL (ref 1.0–3.6)
MCH: 25.9 pg — ABNORMAL LOW (ref 26.0–34.0)
MCHC: 31.5 g/dL — ABNORMAL LOW (ref 32.0–36.0)
MCV: 82 fL (ref 80–100)
Monocyte #: 1.3 x10 3/mm — ABNORMAL HIGH (ref 0.2–0.9)
Monocyte %: 5.5 %
NEUTROS PCT: 88.9 %
Neutrophil #: 21.2 10*3/uL — ABNORMAL HIGH (ref 1.4–6.5)
Platelet: 323 10*3/uL (ref 150–440)
RBC: 3.51 10*6/uL — AB (ref 3.80–5.20)
RDW: 16.6 % — ABNORMAL HIGH (ref 11.5–14.5)
WBC: 23.8 10*3/uL — ABNORMAL HIGH (ref 3.6–11.0)

## 2014-11-04 LAB — CK-MB
CK-MB: 16.5 ng/mL — AB
CK-MB: 15.6 ng/mL — AB

## 2014-11-04 LAB — PROTEIN / CREATININE RATIO, URINE
Creatinine, Urine Random: 95 mg/dL (ref 30–125)
Protein, Urine: 33 mg/dL (ref 0–9)
Protein/Creat. Ratio: 347 mg/gCREAT — ABNORMAL HIGH (ref 0–200)

## 2014-11-04 LAB — FERRITIN: FERRITIN (ARMC): 86 ng/mL

## 2014-11-04 LAB — PHOSPHORUS: PHOSPHORUS: 3.1 mg/dL

## 2014-11-04 LAB — IRON AND TIBC
IRON SATURATION: 5.4
Iron Bind.Cap.(Total): 205 — ABNORMAL LOW (ref 250–450)
Iron: 11 ug/dL — ABNORMAL LOW
Unbound Iron-Bind.Cap.: 193.7

## 2014-11-04 LAB — TROPONIN I
Troponin-I: 4.37 ng/mL — ABNORMAL HIGH
Troponin-I: 4.05 ng/mL — ABNORMAL HIGH

## 2014-11-04 LAB — CLOSTRIDIUM DIFFICILE(ARMC)

## 2014-11-04 LAB — APTT: ACTIVATED PTT: 37.3 s — AB (ref 23.6–35.9)

## 2014-11-04 LAB — MAGNESIUM: Magnesium: 1.6 mg/dL — ABNORMAL LOW

## 2014-11-04 LAB — HEPARIN LEVEL (UNFRACTIONATED): Anti-Xa(Unfractionated): 0.11 IU/mL — ABNORMAL LOW (ref 0.30–0.70)

## 2014-11-04 LAB — LACTIC ACID, PLASMA
Lactic Acid, Venous: 2.6 mmol/L
Lactic Acid, Venous: 1.6 mmol/L

## 2014-11-05 LAB — PROTEIN ELECTROPHORESIS(ARMC)

## 2014-11-05 LAB — COMPREHENSIVE METABOLIC PANEL
AST: 46 U/L — AB
Albumin: 2.2 g/dL — ABNORMAL LOW
Alkaline Phosphatase: 120 U/L
Anion Gap: 5 — ABNORMAL LOW (ref 7–16)
BILIRUBIN TOTAL: 0.9 mg/dL
BUN: 53 mg/dL — ABNORMAL HIGH
CREATININE: 1.84 mg/dL — AB
Calcium, Total: 8.6 mg/dL — ABNORMAL LOW
Chloride: 109 mmol/L
Co2: 22 mmol/L
GFR CALC AF AMER: 27 — AB
GFR CALC NON AF AMER: 23 — AB
Glucose: 93 mg/dL
Potassium: 4 mmol/L
SGPT (ALT): 24 U/L
SODIUM: 136 mmol/L
TOTAL PROTEIN: 6.1 g/dL — AB

## 2014-11-05 LAB — CBC WITH DIFFERENTIAL/PLATELET
BASOS PCT: 0.4 %
Basophil #: 0.1 10*3/uL (ref 0.0–0.1)
Eosinophil #: 0.2 10*3/uL (ref 0.0–0.7)
Eosinophil %: 1 %
HCT: 27.1 % — AB (ref 35.0–47.0)
HGB: 8.6 g/dL — ABNORMAL LOW (ref 12.0–16.0)
Lymphocyte #: 1.4 10*3/uL (ref 1.0–3.6)
Lymphocyte %: 8.6 %
MCH: 25.9 pg — ABNORMAL LOW (ref 26.0–34.0)
MCHC: 31.6 g/dL — ABNORMAL LOW (ref 32.0–36.0)
MCV: 82 fL (ref 80–100)
MONOS PCT: 6.3 %
Monocyte #: 1 x10 3/mm — ABNORMAL HIGH (ref 0.2–0.9)
Neutrophil #: 13.4 10*3/uL — ABNORMAL HIGH (ref 1.4–6.5)
Neutrophil %: 83.7 %
PLATELETS: 318 10*3/uL (ref 150–440)
RBC: 3.31 10*6/uL — AB (ref 3.80–5.20)
RDW: 16.8 % — ABNORMAL HIGH (ref 11.5–14.5)
WBC: 16 10*3/uL — ABNORMAL HIGH (ref 3.6–11.0)

## 2014-11-05 LAB — URINE CULTURE

## 2014-11-05 LAB — HEPARIN LEVEL (UNFRACTIONATED): Anti-Xa(Unfractionated): 0.17 IU/mL — ABNORMAL LOW (ref 0.30–0.70)

## 2014-11-05 LAB — UR PROT ELECTROPHORESIS, URINE RANDOM

## 2014-11-07 LAB — CULTURE, BLOOD (SINGLE)

## 2014-11-21 NOTE — Consult Note (Signed)
PATIENT NAME:  [name], [name] MR#:  [id number] DATE OF BIRTH:  01/18/1919  DATE OF CONSULTATION:  11/04/2014  REFERRING PHYSICIAN:  Sona Patel, MD CONSULTING PHYSICIAN:  Munsoor N. Lateef, MD  REASON FOR CONSULTATION: Acute renal failure with history of chronic kidney disease, stage III.   HISTORY OF PRESENT ILLNESS: The patient is a 81-year-old Caucasian female with past medical history of hypertension, coronary artery disease status post stent placement, bronchiectasis, depression, chronic kidney disease stage III, and GERD who presented to [facility] with complaints of shortness of breath and cough. She is a nursing home resident and was brought here for further evaluation. In the Emergency Department, the patient was febrile with a temperature of 102.3 and also had hypoxic condition with O2 saturations of 80% on room air. Blood pressure was also low upon presentation at 104/58. The patient was started on vancomycin and Levaquin for what appeared to be right-sided pneumonia. We are asked to see the patient for evaluation and management of acute renal failure. The patient's baseline creatinine appears to be 1.6 with an EGFR of 30. Upon presentation, creatinine was 2.74 and is now down to 2.34. The patient was noted to be on losartan as well as furosemide at the nursing home. She also has mild troponin elevation now at 4.05. She also has anemia with a hemoglobin of 9.1.   PAST MEDICAL HISTORY: 1.  Hypertension.  2.  Coronary artery disease status post stent placement.  3.  Bronchiectasis.  4.  Depression.  5.  Chronic kidney disease, stage III, baseline eGFR 30.  6.  GERD.   PAST SURGICAL HISTORY:  1.  Cholecystectomy.  2.  Hysterectomy.  3.  Ovarian cyst removal.  4.  Aortic valve replacement.   ALLERGIES: AUGMENTIN, LIPITOR, CAPTOPRIL, PENICILLIN AND ATENOLOL.   CURRENT INPATIENT MEDICATIONS: INCLUDE:  1.  Acetaminophen 650 mg p.o. every 4 hours p.r.n.  2.  Norco  5/325 one tablet p.o. q. 4 hours p.r.n.  3.  Alprazolam 0.2 mg p.o. b.i.Garcia.  4.  Amitriptyline 25 mg p.o. at bedtime.  5.  Aspirin 81 mg daily.  6.  Colace 100 mg p.o. b.i.Garcia.  7.  Dulcolax 10 mg rectally daily p.r.n. constipation.  8.  Metoprolol 25 mg p.o. daily, which is currently on hold.  9.  Remeron 15 mg p.o. at bedtime.  10.  Zofran 4 mg IV q. 4 hours p.r.n.  11.  Pantoprazole 40 mg p.o. daily.  12.  Zoloft 50 mg p.o. daily.  13.  Topamax 25 mg p.o. at bedtime.  14.  Senokot 1 tablet p.o. at bedtime.  15.  Melatonin 6 mg p.o. at bedtime.  16.  Multivitamin 1 tablet p.o. daily.  17.  MiraLax 17 grams p.o. daily p.r.n.  18.  Prednisolone acetate 1 drop to both eyes q. 24 hours. 19.  Sucralfate 1 gram p.o. q.i.Garcia.  20.  Vancomycin 750 mg IV q. 48 hours.  21.  Levofloxacin 500 mg IV q. 48 hours.   SOCIAL HISTORY: The patient resides at [facility]. No reported tobacco, alcohol, or illicit drug use.   FAMILY HISTORY: The patient has a sister with history of dementia and CVA.   REVIEW OF SYSTEMS: CONSTITUTIONAL: The patient has had fevers and chills and also malaise.  EYES: Denies diplopia, blurry vision.  HEENT: Denies headaches. Denies hearing loss. Denies epistaxis. CARDIOVASCULAR: Endorses chest pain, but denies palpitations.  RESPIRATORY: Positive for cough as well as shortness of breath. Denies hemoptysis.    GASTROINTESTINAL: Denies nausea, vomiting, diarrhea.  GENITOURINARY: Denies frequency, urgency, or dysuria.  MUSCULOSKELETAL: Denies joint pain, swelling or redness.  INTEGUMENTARY: Denies skin rashes or lesions.  NEUROLOGIC: Denies focal extremity numbness, weakness or tingling.  PSYCHIATRIC: Has history of depression.  ENDOCRINE: Denies polyuria, polydipsia.  HEMATOLOGIC AND LYMPHATIC: Denies easy bruisability, bleeding, or swollen lymph nodes.  ALLERGY AND IMMUNOLOGIC: Denies seasonal allergies or history of immunodeficiency.   PHYSICAL EXAMINATION: VITAL  SIGNS: Temperature 99.4, pulse 98, respirations 22, blood pressure 91/53.  GENERAL: Reveals an elderly female, currently in no acute distress.  HEENT: Normocephalic, atraumatic. Extraocular movements are intact. Pupils equal, round, and reactive to light. No scleral icterus. Conjunctivae are pale. No epistaxis noted. Gross hearing intact. Oral mucosa are dry.  NECK: Supple without JVD or lymphadenopathy.  LUNGS: Demonstrate right basilar rales, otherwise clear.  CARDIOVASCULAR: S1, S2 regular rate and rhythm, 2/6 systolic ejection murmur heard.  ABDOMEN: Soft, nontender, nondistended. Bowel sounds positive. No rebound or guarding. No gross organomegaly appreciated.  EXTREMITIES: No clubbing, cyanosis, or edema.  NEUROLOGIC: The patient is awake, alert, and oriented to time and place. She follows commands with both upper and lower extremities.  SKIN: Warm and dry. No rashes noted.  MUSCULOSKELETAL: No joint redness, swelling or tenderness appreciated.  GENITOURINARY: No suprapubic tenderness is noted at this time.  PSYCHIATRIC: The patient with appropriate affect and appears to have some insight into her current illness.   DIAGNOSTIC DATA: Sodium 134, potassium 3.4, chloride 101, CO2 24, BUN 57, creatinine 2.34. Lactic acid 1.6. Total protein 7.4, albumin 2.8, total bilirubin 0.8, alkaline phosphatase 116, AST 44, ALT 20. Troponin 4.05. CBC shows WBC 23, hemoglobin 9.1, hematocrit 28, platelets 323,000. PTT 37.   ABG shows pH of 7.4, pCO2 36 and pO2 of 66.   IMPRESSION: This is a 81 year old Caucasian female with past medical history of hypertension, coronary artery disease status post stent placement, aortic valve replacement, bronchiectasis, depression, chronic kidney disease stage III with baseline eGFR 30, and gastroesophageal reflux disease who presented to [facility] with cough and shortness of breath and found to have right-sided pneumonia. 1.  Acute renal failure  secondary to acute tubular necrosis/chronic kidney disease, stage III. The patient has underlying renal insufficiency. Baseline eGFR is 30 as above. We suspect that she has acute tubular necrosis now from hypotension and septic shock. We will continue the patient on IV fluid hydration with 0.9 normal saline at 60 mL per hour. In addition, we will proceed with additional work-up including renal ultrasound, SPEP, UPEP, ANA, ANCA antibodies, GBM antibodies, C3, and C4. We will also screen the patient for secondary hyperparathyroidism.  2.  Hypokalemia: Serum potassium noted to be low at 3.4. We will administer the patient potassium chloride 40 mEq IV x1 today.  3.  Bacterial pneumonia. Noted in the right lower lobe. Continue vancomycin as well as Levaquin for now.  4.  Anemia of chronic kidney disease. Hemoglobin noted as being 9.1 with a MCV of 82. We will check SPEP and UPEP as well as serum iron studies.   I do would like to thank Dr. Fritzi Mandes for this kind referral. Further plan as the patient progresses.  ____________________________ Tama High, MD mnl:sb Garcia: 11/04/2014 07:42:25 ET T: 11/04/2014 08:20:53 ET JOB#: [id number]  cc: Tama High, MD, <Dictator> Tama High MD ELECTRONICALLY SIGNED 11/09/2014 9:54

## 2014-11-21 NOTE — Discharge Summary (Signed)
PATIENT NAME:  [name], [name] MR#:  [id number] DATE OF BIRTH:  06-08-1919  DATE OF ADMISSION:  11/03/2014 DATE OF DISCHARGE:  11/08/2014  PRESENTING COMPLAINT: Shortness of breath, cough, fever.   DISCHARGE DIAGNOSES:  1.  Streptococcal sepsis with right lower lobe pneumonia.  2.  Acute non Q-wave myocardial infarction.  3.  Acute hypoxic respiratory failure.  4.  Chronic kidney disease Stage III.   CONDITION ON DISCHARGE: Fair.   CODE STATUS: DO NOT RESUSCITATE.   MEDICATIONS:  1.  Amitriptyline 25 mg at bedtime.  2.  Sucralfate 1 gram q.i.Garcia.  3.  Docusate 100 mg at bedtime.  4.  Bisacodyl 10 mg rectal suppository daily as needed.  5.  Mometasone topical lotion 4 drops affected ear once a day at bedtime as needed.  6.  Fluocinonide 0.05% topical ointment affected area b.i.Garcia.  7.  DuoNebs 3 mL every 6 hourly as needed.  8.  Losartan 50 mg daily.  9.  Melatonin 10 mg daily at bedtime.  10.  MiraLax 17 grams p.o. daily at bedtime.  11.  Mucinex Garcia 600/60 one tablet b.i.Garcia. as needed.  12.  Multivitamin p.o. daily.  13.  Pred Forte acetate 1% ophthalmic drop to effected eye daily in the morning.  14.  Protonix 40 mg b.i.Garcia.  15.  Proventil 90 mcg per inhalation 2 puffs 4 times a day as needed.  16.  Refresh 2 drops effected eye as needed.  17.  Remeron 15 mg at bedtime.  18.  Senna-S 1 tablet at bedtime.  19.  Topiramate 25 mg daily at bedtime.  20.  Metoprolol 50 mg extended release 1/2 tablet daily.  21.  Tramadol 50 mg b.i.Garcia.  22.  Alprazolam 0.25 mg 1 tablet b.i.Garcia. as needed.  23.  Sertraline 50 mg p.o. daily.  24.  Dextromethorphan guaifenesin 5 mL p.o. daily at bedtime.  25.  Acetaminophen/hydrocodone 5/325 one tablet every 4 hours as needed.  26.  Lasix 40 mg daily.  27.  Crestor 20 mg at bedtime.  28.  Aspirin 81 mg daily.  29.  Lidocaine 2% topical gel applied every 8 hours as needed on the gum and lips.  30.  Guaifenesin 10 mL every 6 hours as needed.  31.  Levaquin  500 mg q. 48 hourly.  32.  Physical therapy.  33.  Oxygen 1 liter nasal cannula as needed.   DIET: Pureed, nectar thick liquids, aspiration precautions.  Must use small single bites, allow for time for followup swallowing to clear fully.  Medications and puree crushed.    FOLLOWUP:  With PCP in 2 to 4 weeks.   Blood cultures on admission positive for Streptococcus pneumoniae, 4 out of 4 bottles.   Chest x-ray consistent with right lower lobe infiltrate. Underlying interstitial fibrosis.   White count on admission was 29.7.  White count at discharge is 16.  Creatinine on admission was 2.7, at discharge was 1.8. Troponin 4.3, 4.05.   Ultrasound kidney shows no obstructive changes noted. Mild septated right renal cyst which is increased in size from prior examination.   Urine culture negative in 18 to 24 hours.   Clostridium difficile negative.   Hemoglobin and hematocrit are 8.6 and 27.1.   CONSULTATIONS:   1.  [name], M.Garcia., nephrology.  2.  Cardiology, [name], M.Garcia.   BRIEF SUMMARY OF HOSPITAL COURSE:  [name] is 81 year old Caucasian female, comes in from [facility] with increasing shortness of breath, not feeling  well, temperature of 102, saturations 80% with marked leukocytosis and was admitted with:  1.  Streptococcal sepsis with acute respiratory failure with hypoxia secondary to right lower lobe pneumonia.  The patient was started on IV vancomycin and Levaquin which changed to p.o. Levaquin once cultures results were obtained. She remained afebrile. White count came down from 29,000 to 16,000. She remains afebrile.  2.  Right lower lobe pneumonia.  Treatment as above.  3.  Acute non Q-wave myocardial infarction with elevated troponin in the setting of history of CAD status post stent in the past. She is hemodynamically stable. Initially, received IV heparin drip.  Seen by cardiology who recommended medical treatment given multiple medical comorbidities.  Echocardiogram was done. The patient did not have any acute chest pain.  4.  Chronic kidney disease stage III, acute on chronic.  IV fluids were given in the setting of sepsis, possibly and some underlying ATN. Nephrology consultation was obtained.  Nephrotoxins were avoided.  Creatinine came down to 1.8. The patient's Lasix and losartan were resumed.  5.  History of bronchiectasis and chronic interstitial fibrosis. DuoNebs and inhalers were continued p.r.n.  Oxygen was given.  6.  Hypertension. Resumed losartan and Lasix. She is also on a low dose beta blocker in the setting of non Q-wave myocardial infarction.  7.  Hyperlipidemia on statins.  8.  Deep vein thrombosis prophylaxis heparin subcutaneous t.i.Garcia.  9.  The patient will be evaluated by physical therapy prior to discharge.  10.  Overall hospital stay otherwise remained stable.  The patient remained a NO CODE DO NOT RESUSCITATE. She will return back to [facility].   TIME SPENT: 40 minutes.   ____________________________ Karen Hail Allena Katz, MD sap:sp Garcia: 11/08/2014 08:22:26 ET T: 11/08/2014 09:58:54 ET JOB#: [id number]  cc: Karen Merkel A. Allena Katz, MD, <Dictator> Karen Lizabeth Leyden, MD Karen Iba, MD Karen Ora MD ELECTRONICALLY SIGNED 11/10/2014 10:35

## 2014-11-21 NOTE — Consult Note (Signed)
General Aspect Primary Cardiologist: New to [facility] _______________  81 year old female with history of CAD with history of remote MI in 2001 s/p PCI to OM2 at that time, CKD stage III, history of aortic stenosis s/p bioprosthetic valve 01/30/2008, history of GIB, HTN, HLD, bronchiectasis, and chronic anemia who presented to [facility] on 4/13 with increased dyspnea and not feeling well for the past 2 days. She was found to be septic with acute respiratory failure with hypoxia secondary to right lower lobe pneumonia.  ______________   PMH: 1. CAD with history of remote MI in 2001 s/p PCI to OM2 at that time 2. CKD stage III 3. History of aortic stenosis s/p bioprosthetic valve 01/30/2008 4. History of GIB 5. HTN 6. HLD 7. Bronchiectasis 8. Chronic anemia 9. History of aortic stenosis s/p bioprosthetic valve replacement 01/30/2008 _______________   Present Illness 81 year old female with the above problem list who presented with the above and was found to be septic with acute respiratory failure with hypoxia secondary to right lower lobe pneumonia.   She has known known CAD with history of MI in 2001 s/p PCI to OM2.  Last cardiac cath as part of her aortic stenosis work up in 2009 showed no significant coronary disease. Echo at that time (2009) showed normal LV function, moderate LVH, and severe aortic stenosis. She underwent successful bioprosthetic aortic valve replacement on 01/30/2008. Echo on 11/2009 showed an EF of >55%, stable bioprosthetic aortic valve, mild MR, and trivial TR. She underwent RHC in 2011 secondary to increased SOB that showed normal right sided pressures.   She presented to [facility] on 4/13 with a 2 day history of increased dyspnea and generally not feeling well. Apparently she suffered a syncopal episode at her nursing home the week prior and was told to stay in the bed. Her daughter calls the patient daily to check on her. She last visited her on 4/10 and states the patient was at her  baseline which is described as mildly SOB on 3L O2. She was not complaining of any symptoms, including chest pain. She performs all of her ADLs with minimal SOB. Daughter called the patient the prior evening and she noticed the patient to be quite SOB. The daughter reports she was SOB to the point that she could not carry on a conversation. She was not complaining of any chest pain. The daughter advised the patient to tell her nurse she needed to go to the hospital. The daughter drove to the nursing home. She was found to be on 2.5L of O2 and SOB. The patient was brought to the hospital for further evaluation. She was found to be septic with acute respiratory failure with hypoxia secondary to right lower lobe pneumonia, acute renal failure 2/2 ATN/CKD stage III, and have an elevated troponin of 3.69-->4.37-->4.05. CXR showed right base infiltrate with small effusion. She was placed on vancomycin and Levaquin. SCr was found to be 2.74-->2.34 with a baseline GFR around 30. Kidney US did not show obstruction. She was noted to be hypotensive with blood pressures down to 80K systolic at times. She was started on heparin gtt. Echo is pending. She is currently tired.   Physical Exam:  GEN obese, critically ill appearing   HEENT PERRL, moist oral mucosa, HOH   NECK supple  trachea midline  no JVD   RESP normal resp effort  rhonchi   CARD Regular rate and rhythm  Murmur   Murmur Systolic   ABD denies  tenderness  soft   LYMPH negative neck   EXTR negative edema   SKIN normal to palpation   NEURO cranial nerves intact   PSYCH alert   Review of Systems:  General: Fatigue  Fever/chills  Weakness   Skin: No Complaints   ENT: No Complaints   Eyes: No Complaints   Neck: No Complaints   Respiratory: Frequent cough  Short of breath  Wheezing   Cardiovascular: Dyspnea   Gastrointestinal: No Complaints   Genitourinary: No Complaints   Vascular: No Complaints   Musculoskeletal: No  Complaints   Neurologic: No Complaints   Hematologic: No Complaints   Endocrine: No Complaints   Psychiatric: No Complaints   Review of Systems: All other systems were reviewed and found to be negative   Medications/Allergies Reviewed Medications/Allergies reviewed   Family & Social History:  Family and Social History:  Family History Negative  Hypertension  Stroke   Social History negative tobacco, negative ETOH, negative Illicit drugs   Place of Living Nursing Home       chronic anemia:    ckd stage III:    hld:    htn:    bronchiectasis:    history of GIB:    aortic stenosis s/p bioprosthetic aortic valve replacement:    cad with history of MI s/p PCI:    gerd:    Sarcosis:    aortic valve replacement:    mi:    cardiac stents:          Admit Diagnosis:   SEPSIS DUE TO PNEUMONIA NSTEMI: Onset Date: 04-Nov-2014, Status: Active, Description: SEPSIS DUE TO PNEUMONIA NSTEMI  Home Medications: Medication Instructions Status  fluocinonide topical 0.05% topical ointment Apply topically to affected area (bilateral ears/neck) 2 times a day for flare ups.  Active  ICaps AREDS 1 cap(s) orally 2 times a day Active  albuterol-ipratropium 2.5 mg-0.5 mg/3 mL inhalation solution 3 milliliter(s) inhaled every 6 hours, As Needed - for Shortness of Breath Active  furosemide 40 mg oral tablet 1 tab(s) orally 2 times a day Active  losartan 50 mg oral tablet 1 tab(s) orally once a day (in the morning) Active  Melatonin 10 mg oral capsule 1 cap(s) orally once a day (at bedtime) Active  MiraLax - oral powder for reconstitution 17 gram(s) orally once a day (at bedtime), As Needed - for Constipation Active  Mucinex D 600 mg-60 mg oral tablet, extended release 1 tab(s) orally 2 times a day, As Needed for chest congestion.  Active  multivitamin with minerals 1 tab(s) orally once a day Active  acetaminophen-HYDROcodone 325 mg-5 mg oral tablet 1 tab(s) orally every 4 hours, As  Needed - for Pain Active  acetaminophen-HYDROcodone 325 mg-5 mg oral tablet 1 tab(s) orally once a day (at bedtime) Active  Pred Forte acetate 1% ophthalmic suspension 1 drop(s) to each affected eye once a day (in the morning) Active  pantoprazole 40 mg oral delayed release tablet 1 tab(s) orally 2 times a day Active  Proventil HFA CFC free 90 mcg/inh inhalation aerosol 2 puff(s) inhaled every 4 hours, As Needed - for Wheezing, for Shortness of Breath  Active  Refresh - ophthalmic solution 2 drop(s) to each affected eye every 6 hours, As Needed for dry eyes. Active  mirtazapine 15 mg oral tablet 1 tab(s) orally once a day (at bedtime) Active  Senna S 50 mg-8.6 mg oral tablet 1 tab(s) orally once a day (at bedtime) Active  topiramate 25 mg oral tablet 1 tab(s) orally  once a day (at bedtime) Active  metoprolol succinate 50 mg oral tablet, extended release 0.5 tab(s) orally once a day (in the morning) Active  traMADol 50 mg oral tablet 1 tab(s) orally 2 times a day Active  ALPRAZolam 0.25 mg oral tablet 1 tab(s) orally 2 times a day, As Needed - for Anxiety, Nervousness Active  sertraline 50 mg oral tablet 1 tab(s) orally once a day (in the morning) Active  amitriptyline 25 mg oral tablet 1 tab(s) orally once a day (at bedtime) Active  sucralfate 1 g oral tablet 1 tab(s) orally 4 times a day Active  docusate sodium 100 mg oral capsule 1 cap(s) orally once a day (at bedtime) Active  dextromethorphan-guaiFENesin 10 mg-100 mg/5 mL oral liquid 5 milliliter(s) orally once a day (at bedtime) Active  dextromethorphan-guaiFENesin 10 mg-100 mg/5 mL oral liquid 10 milliliter(s) orally every 4 hours, As Needed for cough. Active  bisacodyl 10 mg rectal suppository 1 suppository(ies) rectal once a day, As Needed - for Constipation Active  mometasone topical 0.1% topical lotion 4 drop(s) to each affected ear once a day (at bedtime), As Needed Active   Lab Results:  Routine Micro:  13-Apr-16 21:24   Micro Text  Report BLOOD CULTURE   COMMENT                   NO GROWTH IN 8-12 HOURS   ANTIBIOTIC                       Culture Comment NO GROWTH IN 8-12 HOURS  Result(s) reported on 04 Nov 2014 at 05:00AM.  Lab:  13-Apr-16 21:30   pH (ABG) 7.400 (7.350-7.450 NOTE: New Reference Range 02/13/14)  PCO2 36 (32-48 NOTE: New Reference Range 03/02/14)  PO2  66 (83-108 NOTE: New Reference Range 02/13/14)  Base Excess -2 (-3-3 NOTE: New Reference Range 03/02/14)  HCO3 21.9 (21.0-28.0 NOTE: New Reference Range 02/13/14)  Routine Chem:  14-Apr-16 04:47   Result Comment - TROPONIN  - PREVIOUSLY CALLED  - 11-03-14 @2150   - BY AJO...AJO  Result(s) reported on 04 Nov 2014 at 06:39AM.  Lactic Acid  Venous 1.6 (0.5-2.0 NOTE: New Reference Range:  09/28/14)  Glucose, Serum 97 (65-99 NOTE: New Reference Range  09/28/14)  BUN  57 (6-20 NOTE: New Reference Range  09/28/14)  Creatinine (comp)  2.34 (0.44-1.00 NOTE: New Reference Range  09/28/14)  Sodium, Serum  134 (135-145 NOTE: New Reference Range  09/28/14)  Potassium, Serum  3.4 (3.5-5.1 NOTE: New Reference Range  09/28/14)  Chloride, Serum 101 (101-111 NOTE: New Reference Range  09/28/14)  CO2, Serum 24 (22-32 NOTE: New Reference Range  09/28/14)  Calcium (Total), Serum  8.6 (8.9-10.3 NOTE: New Reference Range  09/28/14)  Anion Gap 9  eGFR (African American)  20  eGFR (Non-African American)  17 (eGFR values <91m/min/1.73 m2 may be an indication of chronic kidney disease (CKD). Calculated eGFR is useful in patients with stable renal function. The eGFR calculation will not be reliable in acutely ill patients when serum creatinine is changing rapidly. It is not useful in patients on dialysis. The eGFR calculation may not be applicable to patients at the low and high extremes of body sizes, pregnant women, and vegetarians.)    08:21   Magnesium, Serum  1.6 (1.7-2.4 THERAPEUTIC RANGE: 4-7 mg/dL TOXIC: > 10 mg/dL   ----------------------- NOTE: New Reference Range  09/28/14)  Iron Binding Capacity (TIBC)  205  Unbound Iron Binding Capacity 193.7  Iron Saturation 5.4 (Result(s) reported on 04 Nov 2014 at 09:34AM.)  Phosphorus, Serum 3.1 (2.5-4.6 NOTE: New Reference Range  09/28/14)  Cardiac:  14-Apr-16 01:20   Troponin I  4.37 (0.00-0.03 0.03 ng/mL or less: NEGATIVE  Repeat testing in 3-6 hrs  if clinically indicated. >0.05 ng/mL: POTENTIAL  MYOCARDIAL INJURY. Repeat  testing in 3-6 hrs if  clinically indicated. NOTE: An increase or decrease  of 30% or more on serial  testing suggests a  clinically important change NOTE: New Reference Range  09/28/14)    04:47   Troponin I  4.05 (0.00-0.03 0.03 ng/mL or less: NEGATIVE  Repeat testing in 3-6 hrs  if clinically indicated. >0.05 ng/mL: POTENTIAL  MYOCARDIAL INJURY. Repeat  testing in 3-6 hrs if  clinically indicated. NOTE: An increase or decrease  of 30% or more on serial  testing suggests a  clinically important change NOTE: New Reference Range  09/28/14)  CPK-MB, Serum  15.6 (0.5-5.0 NOTE: New Reference Range  09/28/14)  Routine Hem:  14-Apr-16 04:47   WBC (CBC)  23.8  RBC (CBC)  3.51  Hemoglobin (CBC)  9.1  Hematocrit (CBC)  28.9  Platelet Count (CBC) 323  MCV 82  MCH  25.9  MCHC  31.5  RDW  16.6  Neutrophil % 88.9  Lymphocyte % 5.1  Monocyte % 5.5  Eosinophil % 0.0  Basophil % 0.5  Neutrophil #  21.2  Lymphocyte # 1.2  Monocyte #  1.3  Eosinophil # 0.0  Basophil # 0.1 (Result(s) reported on 04 Nov 2014 at 05:47AM.)   EKG:  EKG Interp. by me   Interpretation EKG shows sinus tachycardia, 113 bpm, nonspecific inferolateral st/t changes   Radiology Results: XRay:    13-Apr-16 21:36, Chest Portable Single View  Chest Portable Single View   REASON FOR EXAM:    SOB; Hypoxia  COMMENTS:       PROCEDURE: DXR - DXR PORTABLE CHEST SINGLE VIEW  - Nov 03 2014  9:36PM     CLINICAL DATA:  Difficulty breathing.   History of sarcoidosis    EXAM:  PORTABLE CHEST - 1 VIEW    COMPARISON:  September 05, 2014    FINDINGS:  There is underlying interstitial fibrosis in mid and lower lung  zones bilaterally, more on the right than on the left. There is  consolidation with small effusion in the right base. There is mild  atelectasis in the left base. Heart is upper normal in size with  pulmonary vascularity within normal limits. Patient is status post  median sternotomy. There is atherosclerotic change in aorta. No  adenopathy appreciable.     IMPRESSION:  Right base infiltrate with small effusion. Underlying interstitial  fibrosis, stable. No change in cardiac silhouette.      Electronically Signed    By: Lowella Grip III M.D.    On: 11/03/2014 21:38     Verified By: Leafy Kindle. WOODRUFF, M.D.,  [place]:    14-Apr-16 10:28, US Kidney Bilateral  US Kidney Bilateral   REASON FOR EXAM:    acute renal failure  COMMENTS:       PROCEDURE: [place]  - US KIDNEY  - Nov 04 2014 10:28AM     CLINICAL DATA:  Acute renal failure    EXAM:  RENAL/URINARY TRACT ULTRASOUND COMPLETE    COMPARISON:  03/04/2007    FINDINGS:  Right Kidney:  Length: 10.5 cm.. A mildly septated cystic lesion is noted in the  upper pole of the right kidney.  This has increased in size from the  prior exam and now measures 3.8 x 3.6 cm. Cortical calcification is  noted in the lower pole of uncertain significance.    Left Kidney:    Length: 9.9 cm.. Echogenicity within normal limits. No mass or  hydronephrosis visualized.    Bladder:    Appears normal for degree of bladder distention.     IMPRESSION:  Mildly septated right renal cyst which is increased in size from the  prior exam.    Right cortical calcification of uncertain significance. This does  not represent a calculus.    No obstructive changes are noted.      Electronically Signed    By: Inez Catalina M.D.    On: 11/04/2014 10:37         Verified By: Everlene Farrier, M.D.,    Augmentin: Itching  Lipitor: Other  Captopril: Unknown  PCN: Unknown  Atenolol: Unknown  Vital Signs/Nurse's Notes: **Vital Signs.:   14-Apr-16 07:00  Vital Signs Type Routine  Temperature Temperature (F) 99.4  Celsius 37.4  Temperature Source oral  Pulse Pulse 98  Respirations Respirations 22  Systolic BP Systolic BP 91  Diastolic BP (mmHg) Diastolic BP (mmHg) 53  Mean BP 65  Pulse Ox % Pulse Ox % 98  Oxygen Delivery 4L; Nasal Cannula    Impression 81 year old female with history of CAD with history of remote MI in 2001 s/p PCI to OM2 at that time, CKD stage III, history of aortic stenosis s/p bioprosthetic valve 01/30/2008, history of GIB, HTN, HLD, bronchiectasis, and chronic anemia who presented to [facility] on 4/13 with increased dyspnea and not feeling well for the past 2 days. She was found to be septic with acute respiratory failure with hypoxia secondary to right lower lobe pneumonia.  1. Elevated troponin: -Of uncertain significance at this time given her history -Possible supply demand ischemia in the setting of sepsis with acute respiratory distress with hypoxia 2/2 PNA, hypotension, and acute on chronic disease vs NSTEMI -Continue to treat with heparin gtt at this time given the elevation seen in her troponin -Echo is pending to evaluate LV function and wall motion (prior in 2011 showed EF >55%, no WMA) -Not currently an optimal cardiac cath candidate given her acute illness, respiratory distress, acute on chronic renal disease (family does not want HD), and hypotension -Holding beta blocker 2/2 hypotension -Continue aspirin  2. Sepsis with acute respiratory distress with hypoxia 2/2 PNA: -Currently on vancomycin and Levaquin -Inhalers per IM -O2 -Blood culture x 1 positive with GPC in chains  3. Acute on CKD stage III: -Possibly 2/2 ATN -IV hydration -Renal on board  4. CAD with remote history of MI s/p PCI in 2001: -Cardiac cath in 2009  documented at [facility] as no significant CAD -Continue heparin gtt and aspirin as above -Hold beta blocker as above 2/2 hypotension  5. History of bioprosthetic aortic valve replacement 01/2008: -Last echo 11/2009 showed valve functioning well and well seated -Check echo as above  6. History of bronchiectasis: -Inhalers and O2 as above  7. HTN: -Currently hypotensive, antihypertensives on hold  8. HLD: -On statin   9. History of GIB  10. Chronic anemia: -Hgb 12.2 in December 2015 -Per IM   Electronic Signatures: Rise Mu (PA-C)  (Signed 14-Apr-16 13:12)  Authored: General Aspect/Present Illness, History and Physical Exam, Review of System, Family & Social History, Past Medical History, Home Medications, Labs, EKG , Radiology, Allergies, Vital Signs/Nurse's  Notes, Impression/Plan Ida Rogue (MD)  (Signed 14-Apr-16 19:08)  Authored: General Aspect/Present Illness, History and Physical Exam, Family & Social History, Past Medical History, Health Issues, Labs, EKG  Co-Signer: General Aspect/Present Illness, History and Physical Exam, Review of System, Family & Social History, Past Medical History, Home Medications, Labs, EKG , Radiology, Allergies, Vital Signs/Nurse's Notes, Impression/Plan   Last Updated: 14-Apr-16 19:08 by Ida Rogue (MD)

## 2014-11-21 NOTE — H&P (Signed)
PATIENT NAME:  [name], [name] MR#:  [id number] DATE OF BIRTH:  1919/05/19  DATE OF ADMISSION:  11/03/2014  The patient was seen before midnight of 11/03/2014.   REFERRING DOCTOR:  Dorothea GlassmanPaul Malinda, MD     PRIMARY CARE PRACTITIONER:  Doctor at [facility].   PRIMARY PULMONOLOGIST: Herbon E. Meredeth IdeFleming, MD    ADMITTING DOCTOR: Crissie FiguresEdavally N. Phillipa Morden, MD    CHIEF COMPLAINT:  1.  Shortness of breath.  2.  Not feeling well for the past 2 days.   HISTORY OF PRESENT ILLNESS: A 81 year old Caucasian female resident of nursing home with past medical history of hypertension, coronary artery disease/MI, status post stent, aortic valve replacement, bronchiectasis, depression, CKD, hyperlipidemia, on DNR status, brought in with the complaints of shortness of breath and not feeling well for the past 2 days. According to the ED physician's note and as well as the patient's daughter, the patient has not been feeling well with shortness of breath and cough ongoing for the past 2 days. Hence was brought to the Emergency Room for further evaluation.   In the Emergency Room on arrival the patient was noted to have a temperature of 102.3 degrees Fahrenheit and was also noted to be hypoxic with O2 saturations around 80% on room air and blood pressure was borderline low at 104/58. The patient was given oxygen supplementation and after obtaining blood and sputum cultures was started on IV antibiotics, namely vancomycin and Levaquin, and workup revealed markedly elevated white blood cell count of 29.7 with a chest x-ray with right base consolidation with small effusion and elevated creatinine of 2.74. She was also noted to have elevated troponin of 3.69. Hospitalist service was consulted for further management.   At the current time, the patient is alert, awake, and oriented, comfortably resting in the bed.  States that she is feeling a little bit better. I called and talked to the daughter over the phone and  confirmed her code status, which is DNR.   PAST MEDICAL HISTORY:  1.  Hypertension.  2.  Coronary artery disease, status post stent.  3.  AV replacement.  4.  Bronchiectasis.  5.  Depression.  6.  CKD, stage II to III.  7.  Gastroesophageal reflux disease.   PAST SURGICAL HISTORY:  1.  Cholecystectomy.  2.  Hysterectomy.  3.  Ovarian cyst removal.  4.  Aortic valve replacement.   ALLERGIES:  1.  AUGMENTIN.  2.  LIPITOR.  3.  CAPTOPRIL.  4.  PENICILLIN.  5.  ATENOLOL.   FAMILY HISTORY: Sister with a history of dementia and a CVA.   SOCIAL HISTORY: She is single. [facility] resides [facility]. No history of smoking, alcohol, or substance use according to the patient's daughter.   HOME MEDICATIONS:   1.  Acetaminophen/hydrocodone 325/5 mg, 1 tablet every 4 hours as needed for pain.  2.  Albuterol/ipratropium 2.5 mg/0.5 mg inhalation solution every 6 hours as needed for shortness of breath.  3.  Alprazolam 0.25 mg 1 tablet orally 2 times a day as needed for anxiety.  4.  Amitriptyline 25 mg 1 tablet orally once at bedtime.  5.  Bisacodyl 10 mg rectal suppository once a day as needed for constipation.  6.  Dextromethorphan/guaifenesin 10 mg/100 mg liquid 5 mL orally once a day at bedtime.  7.  Docusate sodium 100 mg oral capsule 1 capsule orally at bedtime.  8.  Furosemide 40 mg 1 tablet orally 2 times a day.  9.  ICaps AREDS 1 capsule orally 2 times a day.  10.  Losartan 50 mg 1 tablet orally once a day in the morning.  11.  Melatonin 10 mg oral capsule 1 capsule orally once a day.  12.  Metoprolol succinate 50 mg oral tablet extended release, 0.5 tablet orally once a day.  13.  MiraLax p.r.n. for constipation.  14.  Mirtazapine 15 mg oral tablet, 1 tablet orally once a day at bedtime.  15.  Multivitamin 1 tablet orally once a day.  16.  Pantoprazole 40 mg 1 tablet orally once a day.  17.  Proventil HFA 2 puffs every 4 hours as needed for Sob. 18.  Refresh  ophthalmic solution two drops to each affected eye every 6 hours as needed for dry eyes.  19.  Senna 50/8.6 mg oral tablet, 1 tablet orally once a day at bedtime.  20.  Sertraline 50 mg 1 tablet orally once a day in the morning.  21.  Sucralfate 1 gram 1 tablet orally 4 times a day.  22.  Topiramate 25 mg oral tablet, 1 tablet orally once a day at bedtime.  23.  Tramadol 50 mg 1 tablet orally 2 times a day.    REVIEW OF SYSTEMS:  CONSTITUTIONAL: Positive for generalized weakness with some not feeling well for the past 2 days.  EYES: Negative for blurred vision, double vision. No pain. No redness. No discharge.  EARS, NOSE, AND THROAT: Negative for tinnitus, ear pain, hearing loss, epistaxis, nasal discharge.  RESPIRATORY:  Positive for cough with expectoration for the past 2 days. Negative for dyspnea or wheezing, hemoptysis. No painful respiration.  CARDIOVASCULAR: Negative for chest pain, palpitations, dizziness, syncopal episode, orthopnea, pedal edema.  GASTROINTESTINAL: Negative for nausea, vomiting, diarrhea, constipation, abdominal pain, hematemesis, melena, rectal bleeding.  GENITOURINARY: Negative for dysuria, frequency, urgency, hematuria.  ENDOCRINE: Negative for polyuria, nocturia, heat or cold intolerance.  HEMATOLOGIC AND LYMPHATIC: Negative for anemia, easy bruising, bleeding, swollen glands.  INTEGUMENTARY:  Negative for acne, skin rash, or lesions.  MUSCULOSKELETAL: Positive for some chronic arthritis controlled with p.r.n. pain medications.  NEUROLOGICAL: Negative for focal weakness or numbness. No history of CVA, TIA, seizure disorder.  PSYCHIATRIC: Positive for history of depression, stable on home medications.   PHYSICAL EXAMINATION:  VITAL SIGNS: Temperature on arrival 102.3 degrees Fahrenheit, pulse rate 113 per minute, respirations 38 per minute on arrival, current respiration rate 25 per minute,  blood pressure 119/64, O2 saturation is 80% on room air. Current  saturation is 94% on 4 liters nasal cannula.   GENERAL: Well developed, well nourished, alert, in no acute distress, resting in the bed comfortably.  HEAD: Atraumatic, normocephalic.  EYES: Pupils equal, react to light and accommodation. No conjunctival pallor. No icterus. Extraocular movements intact.  NOSE: No drainage.  EARS: No drainage.  ORAL CAVITY: No mucosal lesions. No exudates.  NECK: Supple. No JVD. No thyromegaly. No carotid bruit. Range of motion of neck within normal limits.  RESPIRATORY: Good respiratory effort. Not using accessory muscles of respiration. Bilateral air entry present. Bilateral occasional rhonchi present. Bilateral rales at bases present, right more than the left.  CARDIOVASCULAR: S1, S2 regular. No murmurs, gallops, or clicks. Pulses equal at carotid, femoral, pedal pulses. No peripheral asthma.  GASTROINTESTINAL: Abdomen soft, nontender. No hepatosplenomegaly. No masses. No rigidity. No guarding. Bowel sounds present and equal in all 4 quadrants.  GENITOURINARY: Deferred.   MUSCULOSKELETAL: No joint tenderness or effusion. Range of motion adequate. Strength and tone equal  bilaterally.  SKIN: Inspection within normal limits.  LYMPHATICS: No cervical lymphadenopathy.   VASCULAR: Good dorsalis pedis, posterior tibial pulses.  NEUROLOGICAL: Alert, awake, and oriented x 3. Cranial nerves II through XII grossly intact. No sensory deficit. Motor strength 5/5 in both upper and lower extremities. DTRs 2+ bilateral,  symmetrical. Plantars downgoing.  PSYCHIATRIC: Alert, awake, and oriented x 3. Judgment, insight adequate. Memory and mood within normal limits.   ANCILLARY DATA:  LABORATORY DATA: Serum glucose 134, BUN 59, creatinine 2.74, sodium 134, potassium 3.5, chloride 100, bicarbonate 22, total calcium 9. serum phosphorus 3.6, magnesium 1.8, total protein 7.4, albumin 2.8, total bilirubin 0.8, alkaline phosphatase 116, AST 44, ALT 20, CPK/MB 18.1, troponin 3.69, WBC  29.7, hemoglobin 10.2, hematocrit 31.6, platelet count 372,000, prothrombin time 16.5, INR 1.3.   ABG: PH of 7.40, PCO2 of 36, pO2 66, bicarbonate 21.9, O2 saturation 94.5, FiO2 of 36%.   Chest x-ray: Right base infiltrate with small effusion. Underlying interstitial fibrosis, stable. No change in cardiac silhouette.   EKG: Sinus tachycardia with ventricular rate of 113 beats per minute, nonspecific ST, T changes.   ASSESSMENT AND PLAN: A 81 year old Caucasian female, nursing home resident, brought in with the complaints of shortness of breath, not feeling well for the past 2 days, found to have temperature of 102 degrees Fahrenheit, hypoxic with room air O2 80%, and the patient on do not resuscitate status and also found to have marked leukocytosis and a chest x-ray with right lower lobe infiltrate and was also noted to have elevated troponin of 3.69.  1.  Acute respiratory failure with hypoxia secondary to right lower lobe pneumonia. Plan: Admit, blood and sputum cultures obtained, continue vancomycin and Levaquin, oxygen supplementation, DuoNebs, monitor O2 saturations follow up cultures and CBC.  2.  Right lower lobe pneumonia.  Plan: As mentioned above.  3.  Elevated troponin, non-ST elevation myocardial infarction, history of coronary artery disease, status post stent in the past. The patient is hemodynamically stable. Plan: We will admit to telemetry monitoring, oxygen supplementation, continue beta blocker, aspirin, and heparin drip. We will request echocardiogram and cardiology consultation for further input.  4.  Chronic kidney disease stage III, acute on chronic. Plan: Gentle hydration, hold losartan and furosemide for now, follow up BMP, consider further workup accordingly.  5.  History of coronary artery disease/myocardial infarction, status post stent. Care as above.   6.  History of bronchiectasis. Continue DuoNebs and oxygen supplementation. Pulmonary consult as needed.  7.   Hypertension. Blood pressure is borderline low stable. We hold losartan and furosemide because of acute on chronic kidney disease stage III. We will continue beta blocker and monitor blood pressure closely.  8.  Hyperlipidemia, on statin. Continue same.  9.  Depression, stable on home medications. Continue same.  10.  Deep venous thrombosis prophylaxis, heparin drip.  11.  Gastrointestinal prophylaxis, proton pump inhibitor.   CODE STATUS: DNR. DNR status confirmed with the patient's daughter over the phone.   TIME SPENT: 50 minutes.    ____________________________ Crissie Figures, MD enr:AT Garcia: 11/04/2014 00:57:11 ET T: 11/04/2014 02:06:45 ET JOB#: [id number]  cc: Crissie Figures, MD, <Dictator> [facility] Crissie Figures MD ELECTRONICALLY SIGNED 11/04/2014 14:10

## 2014-12-02 ENCOUNTER — Other Ambulatory Visit: Payer: Self-pay | Admitting: Family Medicine

## 2014-12-02 DIAGNOSIS — R1312 Dysphagia, oropharyngeal phase: Secondary | ICD-10-CM

## 2014-12-08 ENCOUNTER — Other Ambulatory Visit
Admission: RE | Admit: 2014-12-08 | Discharge: 2014-12-08 | Disposition: A | Discharge status: Home / Self Care | Payer: Medicare Other | Source: Ambulatory Visit | Attending: Family Medicine | Admitting: Family Medicine

## 2014-12-08 ENCOUNTER — Ambulatory Visit: Payer: Medicare Other

## 2014-12-08 DIAGNOSIS — I509 Heart failure, unspecified: Secondary | ICD-10-CM | POA: Diagnosis present

## 2014-12-08 LAB — CREATININE, SERUM
Creatinine, Ser: 1.24 mg/dL — ABNORMAL HIGH (ref 0.44–1.00)
GFR calc non Af Amer: 36 mL/min — ABNORMAL LOW (ref >60)
GFR, EST AFRICAN AMERICAN: 42 mL/min — AB (ref >60)

## 2014-12-09 LAB — VANCOMYCIN, TROUGH: Vancomycin Tr: 18 ug/mL (ref 10–20)

## 2014-12-13 ENCOUNTER — Ambulatory Visit
Admission: RE | Admit: 2014-12-13 | Discharge: 2014-12-13 | Disposition: A | Discharge status: Home / Self Care | Payer: Medicare Other | Source: Ambulatory Visit | Attending: Family Medicine | Admitting: Family Medicine

## 2014-12-13 DIAGNOSIS — R1312 Dysphagia, oropharyngeal phase: Secondary | ICD-10-CM | POA: Diagnosis present

## 2014-12-13 NOTE — Therapy (Signed)
[facility] DIAGNOSTIC RADIOLOGY [address] [place], [place], [address] Phone: [phone]   Fax:     Modified Barium Swallow  Patient Details  Name: [name] MRN: [id number] Date of Birth: 1918-11-15 Referring Provider:  Dorothey Baseman, MD  Encounter Date: 12/13/2014   Subjective: Patient behavior:Patient alert and ver bally responsive.  Able to follow smiple directions to complete the examination.   Objective:  Radiological Procedure: A videoflouroscopic evaluation of oral-preparatory, reflex initiation, and pharyngeal phases of the swallow was performed; as well as a screening of the upper esophageal phase.  I. POSTURE: Upright in MBS chair  II. VIEW: Lateral  III. COMPENSATORY STRATEGIES: Small boluses  IV. BOLUSES ADMINISTERED:   Thin Liquid: 2 teaspoon, 2 small cup rim sips  Nectar-thick Liquid: 2 teaspoon, 2 small cup rim sips  Puree: 2 teaspoon  Mechanical Soft: 1/4 graham cracker in applesauce  V. RESULTS OF EVALUATION: A. ORAL PREPARATORY PHASE: (The lips, tongue, and velum are observed for strength and coordination): disorganized tongue control resulting in delayed oral transit, posterior spillage into the pharynx,, and poor bolus cohesion.       **Overall Severity Rating: Mild  B. SWALLOW INITIATION/REFLEX: (The reflex is normal if "triggered" by the time the bolus reached the base of the tongue): triggers as the bolus spills from the vallecular space to the pyriform sinuses (liquids)  **Overall Severity Rating:Mild  C. PHARYNGEAL PHASE: (Pharyngeal function is normal if the bolus shows rapid, smooth, and continuous transit through the pharynx and there is no pharyngeal residue after the swallow) Within normal limits once the pharyngeal swallow is intiated.  No significant pharyngeal residue.  **Overall Severity Rating: Minimal  D. LARYNGEAL PENETRATION: (Material entering into the laryngeal inlet/vestibule but not  aspirated) Consistent flash/transient laryngeal penetration with nectar-thick and thin liquids without laryngeal vestibule residue.  E. ASPIRATION: None  F. ESOPHAGEAL PHASE: (Screening of the upper esophagus) No observed abnormality within the observable cervical esophagus.  ASSESSMENT: This 81 year old woman, with recurrent pneumonias, is presenting with mild oropharyngeal dysphagia characterized by impaired lingual control for efficient/effective posterior bolus transfer and imparied bolus cohesion.   Timing of the pharyngeal swallow is delayed.  There was no significant pharyngeal residue post swallow.  There was consistent flash/transient laryngeal penetration (without observed tracheal aspiration) with nectar-thick and thin liquids via spoon and SMALL cup rim sips.  This study supports mechanical soft diet with thin liquids, using small bites/sips.   Per hospital records, the patient has GERD, peptic ulcer disease, and a history of erosive esophagitis.  Recommend that the patient follow standard swallowing precautions, follow strict reflux precautions, and maintain stringent oral care to reduce risk for aspiration of oral/pharyngeal bacteria.    PLAN/RECOMMENDATIONS:  A. Diet:Mechanical soft with thin liquids (pending SLP clinical assessment in situ)   B. Swallowing Precautions:   Upright for 90 minutes post po intake   Small bites and sips   Stringent oral hygiene   Strict reflux precautions   C. Recommended consultation to N/A   D. Therapy recommendations: Speech therapy to follow for clinical assessment in usual   eating environment and develop safe feeding/swallowing guidelines per clinical   presentation and results of this study.   E. Results and recommendations were discussed with the patient immediately following the   study, report routed to MD, and report FAXed to treating SLP.        End of Session - 12/13/14 1334    Visit Number 1  Number of Visits 1   Date for SLP  Re-Evaluation 12/13/14   SLP Start Time 1235   SLP Stop Time  1335   SLP Time Calculation (min) 60 min   Activity Tolerance Patient tolerated treatment well      Past Medical History  Diagnosis Date  . Hypertension   . Hyperlipidemia   . OSA (obstructive sleep apnea)     not on CPAP  . Bronchiectasis   . Aortic stenosis     s/p AV replacement with pig valve  . CAD (coronary artery disease)     Present PCI and MI  . GERD (gastroesophageal reflux disease)   . PUD (peptic ulcer disease)   . Legally blind   . Scleroderma     Caused hemoptysis about 3 years ago  . Blood transfusion without reported diagnosis   . Psoriasis   . Hypothyroidism     hx of, but not currently and not on medications  . Osteoarthritis   . Pneumonia     Past Surgical History  Procedure Laterality Date  . Cardiac valve replacement  2011  . Coronary artery bypass graft  2011  . Left shoulder surgery   many years ago    arthoscopy at [facility] twice many years ago  . Corneal transplant  2003  . Cholecystectomy  2007  . Appendectomy    . Abdominal hysterectomy  1940  . Total knee arthroplasty  2000    left knee  . Esophagogastroduodenoscopy  06/23/2012    Procedure: ESOPHAGOGASTRODUODENOSCOPY (EGD);  Surgeon: Petra KubaMarc E Magod, MD;  [facility];  Service: Endoscopy;  Laterality: N/A;  . Esophagogastroduodenoscopy  07/14/2012    Procedure: ESOPHAGOGASTRODUODENOSCOPY (EGD);  Surgeon: Florencia Reasonsobert V Buccini, MD;  [facility];  Service: Endoscopy;  Laterality: Left;    There were no vitals filed for this visit.  Visit Diagnosis: Oropharyngeal dysphagia - Plan: DG OP Swallowing Func-Medicare/Speech Path, DG OP Swallowing Func-Medicare/Speech Path         G-Codes - 12/13/14 1336    Functional Assessment Tool Used MBS   Functional Limitations Swallowing   Swallow Current Status (U9811(G8996) At least 20 percent but less than 40 percent impaired, limited or restricted   Swallow Goal Status ([id number]([id number])  At least 20 percent but less than 40 percent impaired, limited or restricted   Swallow Discharge Status [phone]([id number]) At least 20 percent but less than 40 percent impaired, limited or restricted     Problem List Patient Active Problem List   Diagnosis Date Noted  . Erosive esophagitis 07/20/2012  . Pain in both feet 07/17/2012  . Acute kidney injury 07/12/2012  . Leukocytosis 07/12/2012  . Hypokalemia 06/23/2012  . Nausea vomiting and diarrhea 06/22/2012  . GI bleed 06/22/2012  . Anemia 06/22/2012  . Acute blood loss anemia 06/22/2012  . Respiratory failure, acute-on-chronic 06/22/2012  . GERD (gastroesophageal reflux disease) 05/30/2012  . Ischial bursitis 03/26/2012  . Carotid aneurysm, right 03/05/2012  . Chronic respiratory failure 02/14/2012  . Chronic rhinitis 01/03/2012  . Dyspnea 11/21/2011  . Bioprosthetic aortic valve replacement during current hospitalization 11/21/2011  . CAD (coronary artery disease) 11/21/2011  . Hypertension 11/21/2011  . Hyperlipidemia 11/21/2011  . H/O aortic valve replacement 11/21/2011  . Bronchiectasis 11/21/2011   Dollene PrimroseSusan G Myliah Medel, MS/CCC- SLP   Leandrew KoyanagiAbernathy, Susie 12/13/2014, 2:59 PM  [place] [facility] DIAGNOSTIC RADIOLOGY [address] [place], [place], [address] Phone: [phone]   Fax:

## 2015-06-29 ENCOUNTER — Emergency Department: Payer: Medicare Other

## 2015-06-29 ENCOUNTER — Emergency Department
Admission: EM | Admit: 2015-06-29 | Discharge: 2015-06-29 | Disposition: A | Discharge status: Home / Self Care | Payer: Medicare Other | Attending: Emergency Medicine | Admitting: Emergency Medicine

## 2015-06-29 DIAGNOSIS — Z880 Allergy status to penicillin: Secondary | ICD-10-CM | POA: Diagnosis not present

## 2015-06-29 DIAGNOSIS — J40 Bronchitis, not specified as acute or chronic: Secondary | ICD-10-CM

## 2015-06-29 DIAGNOSIS — Z79899 Other long term (current) drug therapy: Secondary | ICD-10-CM | POA: Diagnosis not present

## 2015-06-29 DIAGNOSIS — J209 Acute bronchitis, unspecified: Secondary | ICD-10-CM | POA: Insufficient documentation

## 2015-06-29 DIAGNOSIS — I10 Essential (primary) hypertension: Secondary | ICD-10-CM | POA: Insufficient documentation

## 2015-06-29 DIAGNOSIS — J471 Bronchiectasis with (acute) exacerbation: Secondary | ICD-10-CM | POA: Insufficient documentation

## 2015-06-29 DIAGNOSIS — Z9981 Dependence on supplemental oxygen: Secondary | ICD-10-CM | POA: Insufficient documentation

## 2015-06-29 DIAGNOSIS — R05 Cough: Secondary | ICD-10-CM | POA: Diagnosis present

## 2015-06-29 LAB — CBC WITH DIFFERENTIAL/PLATELET
BASOS ABS: 0.1 10*3/uL (ref 0–0.1)
BASOS PCT: 1 %
Eosinophils Absolute: 0.1 10*3/uL (ref 0–0.7)
Eosinophils Relative: 1 %
HEMATOCRIT: 27.2 % — AB (ref 35.0–47.0)
HEMOGLOBIN: 8.6 g/dL — AB (ref 12.0–16.0)
Lymphocytes Relative: 11 %
Lymphs Abs: 1.5 10*3/uL (ref 1.0–3.6)
MCH: 25.4 pg — ABNORMAL LOW (ref 26.0–34.0)
MCHC: 31.8 g/dL — ABNORMAL LOW (ref 32.0–36.0)
MCV: 79.9 fL — ABNORMAL LOW (ref 80.0–100.0)
Monocytes Absolute: 1.1 10*3/uL — ABNORMAL HIGH (ref 0.2–0.9)
Monocytes Relative: 8 %
NEUTROS ABS: 10.8 10*3/uL — AB (ref 1.4–6.5)
NEUTROS PCT: 79 %
Platelets: 308 10*3/uL (ref 150–440)
RBC: 3.41 MIL/uL — ABNORMAL LOW (ref 3.80–5.20)
RDW: 16.7 % — ABNORMAL HIGH (ref 11.5–14.5)
WBC: 13.6 10*3/uL — ABNORMAL HIGH (ref 3.6–11.0)

## 2015-06-29 LAB — BASIC METABOLIC PANEL
Anion gap: 7 (ref 5–15)
BUN: 40 mg/dL — ABNORMAL HIGH (ref 6–20)
CHLORIDE: 107 mmol/L (ref 101–111)
CO2: 22 mmol/L (ref 22–32)
Calcium: 9.1 mg/dL (ref 8.9–10.3)
Creatinine, Ser: 1.78 mg/dL — ABNORMAL HIGH (ref 0.44–1.00)
GFR calc non Af Amer: 23 mL/min — ABNORMAL LOW (ref >60)
GFR, EST AFRICAN AMERICAN: 27 mL/min — AB (ref >60)
Glucose, Bld: 114 mg/dL — ABNORMAL HIGH (ref 65–99)
Potassium: 4.5 mmol/L (ref 3.5–5.1)
Sodium: 136 mmol/L (ref 135–145)

## 2015-06-29 NOTE — ED Notes (Signed)
MD at bedside. 

## 2015-06-29 NOTE — ED Notes (Addendum)
Report to Sw[facility] at [facility] Per Swann, pt needs to be returned to [facility] via EMS.  Daughter, Toniann FailWendy unable to transport patient back to facility.

## 2015-06-29 NOTE — ED Notes (Signed)
Pt given drink and apple sauce 

## 2015-06-29 NOTE — ED Provider Notes (Addendum)
[facility] Emergency Department Provider Note REMINDER - THIS NOTE IS NOT A FINAL MEDICAL RECORD UNTIL IT IS SIGNED. UNTIL THEN, THE CONTENT BELOW MAY REFLECT INFORMATION FROM A DOCUMENTATION TEMPLATE, NOT THE ACTUAL PATIENT VISIT. ____________________________________________  Time seen: Approximately 3:45 PM  I have reviewed the triage vital signs and the nursing notes.   HISTORY  Chief Complaint Follow-up    HPI [name] is a 81 y.o. female who presents at the request of family for evaluation of possible "pneumonia". The patient has been having a cough for about the last 3-4 days, and physician at her nursing facility prescribed her azithromycin starting on the fifth. She then was changed to clindamycin and Levaquin today, scheduled for a one-week course of both.  Family requested that because she's had over "20 episodes" of pneumonia in the past that they wanted her to get evaluated in the ER to make sure it was not a reason she needs to be admitted to the hospital. The patient does not endorse feeling short of breath, she normally wears 2 L of oxygen, and she has been receiving breathing treatments per the family. Patient denies any pain, though she does report that her cough at times is somewhat annoying. She denies wheezing. No known fever.  Family reports and the patient reports that her symptoms are continuing, but they do not seem to be worsening after starting antibiotics.   Past Medical History  Diagnosis Date  . Hypertension   . Hyperlipidemia   . OSA (obstructive sleep apnea)     not on CPAP  . Bronchiectasis   . Aortic stenosis     s/p AV replacement with pig valve  . CAD (coronary artery disease)     Present PCI and MI  . GERD (gastroesophageal reflux disease)   . PUD (peptic ulcer disease)   . Legally blind   . Scleroderma (HCC)     Caused hemoptysis about 3 years ago  . Blood transfusion without reported diagnosis   . Psoriasis    . Hypothyroidism     hx of, but not currently and not on medications  . Osteoarthritis   . Pneumonia     Patient Active Problem List   Diagnosis Date Noted  . Erosive esophagitis 07/20/2012  . Pain in both feet 07/17/2012  . Acute kidney injury (HCC) 07/12/2012  . Leukocytosis 07/12/2012  . Hypokalemia 06/23/2012  . Nausea vomiting and diarrhea 06/22/2012  . GI bleed 06/22/2012  . Anemia 06/22/2012  . Acute blood loss anemia 06/22/2012  . Respiratory failure, acute-on-chronic (HCC) 06/22/2012  . GERD (gastroesophageal reflux disease) 05/30/2012  . Ischial bursitis 03/26/2012  . Carotid aneurysm, right (HCC) 03/05/2012  . Chronic respiratory failure (HCC) 02/14/2012  . Chronic rhinitis 01/03/2012  . Dyspnea 11/21/2011  . Bioprosthetic aortic valve replacement during current hospitalization 11/21/2011  . CAD (coronary artery disease) 11/21/2011  . Hypertension 11/21/2011  . Hyperlipidemia 11/21/2011  . H/O aortic valve replacement 11/21/2011  . Bronchiectasis (HCC) 11/21/2011    Past Surgical History  Procedure Laterality Date  . Cardiac valve replacement  2011  . Coronary artery bypass graft  2011  . Left shoulder surgery   many years ago    arthoscopy at [facility] twice many years ago  . Corneal transplant  2003  . Cholecystectomy  2007  . Appendectomy    . Abdominal hysterectomy  1940  . Total knee arthroplasty  2000    left knee  . Esophagogastroduodenoscopy  06/23/2012  Procedure: ESOPHAGOGASTRODUODENOSCOPY (EGD);  Surgeon: Petra Kuba, MD;  Location: [facility] ENDOSCOPY;  Service: Endoscopy;  Laterality: N/A;  . Esophagogastroduodenoscopy  07/14/2012    Procedure: ESOPHAGOGASTRODUODENOSCOPY (EGD);  Surgeon: Florencia Reasons, MD;  Location: [facility] ENDOSCOPY;  Service: Endoscopy;  Laterality: Left;    Current Outpatient Rx  Name  Route  Sig  Dispense  Refill  . amitriptyline (ELAVIL) 50 MG tablet   Oral   Take 50 mg by mouth at bedtime.         Marland Kitchen EXPIRED: amLODipine  (NORVASC) 5 MG tablet   Oral   Take 1 tablet (5 mg total) by mouth daily.   90 tablet   0   . bisacodyl (DULCOLAX) 10 MG suppository   Rectal   Place 1 suppository (10 mg total) rectally daily as needed.   12 suppository   0   . cloNIDine (CATAPRES - DOSED IN MG/24 HR) 0.1 mg/24hr patch   Transdermal   Place 1 patch onto the skin once a week. Thursday         . docusate sodium 100 MG CAPS   Oral   Take 100 mg by mouth 2 (two) times daily.   10 capsule   0   . feeding supplement (RESOURCE BREEZE) LIQD   Oral   Take 1 Container by mouth 3 (three) times daily between meals.         . furosemide (LASIX) 40 MG tablet   Oral   Take 1 tablet (40 mg total) by mouth daily.   30 tablet   0   . ipratropium-albuterol (DUONEB) 0.5-2.5 (3) MG/3ML SOLN   Nebulization   Take 3 mLs by nebulization every 4 (four) hours as needed.   360 mL   11   . Melatonin 10 MG TABS   Oral   Take 1 tablet by mouth at bedtime.         . metoprolol succinate (TOPROL-XL) 100 MG 24 hr tablet   Oral   Take 1 tablet (100 mg total) by mouth daily. Take with or immediately following a meal.   90 tablet   0   . ondansetron (ZOFRAN ODT) 4 MG disintegrating tablet   Oral   Take 1 tablet (4 mg total) by mouth every 8 (eight) hours as needed for nausea.   10 tablet   0   . pantoprazole (PROTONIX) 40 MG tablet   Oral   Take 1 tablet (40 mg total) by mouth 2 (two) times daily before a meal.         . prednisoLONE acetate (PRED FORTE) 1 % ophthalmic suspension   Both Eyes   Place 1 drop into both eyes daily.   5 mL   4   . sucralfate (CARAFATE) 1 GM/10ML suspension   Oral   Take 10 mLs (1 g total) by mouth 4 (four) times daily -  with meals and at bedtime.   420 mL      . topiramate (TOPAMAX) 25 MG tablet   Oral   Take 1 tablet (25 mg total) by mouth at bedtime.   90 tablet   0   . traMADol (ULTRAM) 50 MG tablet   Oral   Take 1 tablet (50 mg total) by mouth every 8 (eight) hours  as needed.   30 tablet   0   . valsartan (DIOVAN) 320 MG tablet   Oral   Take 1 tablet (320 mg total) by mouth daily.   90 tablet  4     Allergies Atenolol; Augmentin; Bactroban; Captopril; Lipitor; and Penicillins  Family History  Problem Relation Age of Onset  . Heart disease Sister   . Heart disease Brother     MI x 2  . Lung disease Brother     ? dz, was a smoker    Social History Social History  Substance Use Topics  . Smoking status: Never Smoker   . Smokeless tobacco: Never Used  . Alcohol Use: No    Review of Systems Constitutional: No fever/chills, though she does report feeling congested and mildly ill. Eyes: No visual changes. ENT: No sore throat. Cardiovascular: Denies chest pain. Respiratory: Denies shortness of breath. She does report a frequent productive cough for the last 3-4 days. Gastrointestinal: No abdominal pain.  No nausea, no vomiting.  No diarrhea.  No constipation. Genitourinary: Negative for dysuria. Musculoskeletal: Negative for back pain. Skin: Negative for rash. Neurological: Negative for headaches, focal weakness or numbness.  10-point ROS otherwise negative.  ____________________________________________   PHYSICAL EXAM:  VITAL SIGNS: ED Triage Vitals  Enc Vitals Group     BP 06/29/15 1535 127/75 mmHg     Pulse Rate 06/29/15 1535 70     Resp 06/29/15 1535 13     Temp 06/29/15 1535 98.3 F (36.8 C)     Temp Source 06/29/15 1535 Oral     SpO2 06/29/15 1535 97 %     Weight 06/29/15 1535 141 lb (63.957 kg)     Height 06/29/15 1535 5' (1.524 m)     Head Cir --      [facility] --      Pain Score --      Pain Loc --      Pain Edu? --      Excl. in GC? --    Constitutional: Alert and oriented. Well appearing and in no acute distress. Eyes: Conjunctivae are normal. PERRL. EOMI. Head: Atraumatic. Nose: Mild coryza, clear. Mouth/Throat: Mucous membranes are moist.  Oropharynx non-erythematous. Neck: No stridor.    Cardiovascular: Normal rate, regular rhythm. Grossly normal heart sounds.  Good peripheral circulation. Respiratory: Normal respiratory effort.  No retractions. Patient has coarse rhonchi with expiration bilaterally and scant end expiratory wheezing. She speaks in full sentences without distress. Appears to be resting comfortably. She does exhibit a frequent cough, but does not appear to be clearly productive in the ER. Gastrointestinal: Soft and nontender. No distention. No abdominal bruits. No CVA tenderness. Musculoskeletal: No lower extremity tenderness nor edema.  No joint effusions. Neurologic:  Normal speech and language. No gross focal neurologic deficits are appreciated.  Skin:  Skin is warm, dry and intact. No rash noted. Psychiatric: Mood and affect are normal. Speech and behavior are normal.  ____________________________________________   LABS (all labs ordered are listed, but only abnormal results are displayed)  Labs Reviewed  CBC WITH DIFFERENTIAL/PLATELET - Abnormal; Notable for the following:    WBC 13.6 (*)    RBC 3.41 (*)    Hemoglobin 8.6 (*)    HCT 27.2 (*)    MCV 79.9 (*)    MCH 25.4 (*)    MCHC 31.8 (*)    RDW 16.7 (*)    Neutro Abs 10.8 (*)    Monocytes Absolute 1.1 (*)    All other components within normal limits  BASIC METABOLIC PANEL - Abnormal; Notable for the following:    Glucose, Bld 114 (*)    BUN 40 (*)    Creatinine, Ser 1.78 (*)  GFR calc non Af Amer 23 (*)    GFR calc Af Amer 27 (*)    All other components within normal limits   Leukocytosis. Appears to have chronic renal insufficiency. ____________________________________________  EKG  ED ECG REPORT  I, QUALE, MARK, the attending physician, personally viewed and interpreted this ECG.  Date: 06/29/2015 EKG Time: 1545 Rate: 70 Rhythm: normal sinus rhythm QRS Axis: normal Intervals: normal ST/T Wave abnormalities: normal Conduction Disutrbances: none Narrative Interpretation:  unremarkable, no acute ischemic changes  ____________________________________________  RADIOLOGY  DG Chest 2 View (Final result) Result time: 06/29/15 16:34:00   Final result by Rad Results In Interface (06/29/15 16:34:00)   Narrative:   CLINICAL DATA: Pneumonia and started antibiotics today. Generalized body aches. History of COPD and hypertension. Productive cough.  EXAM: CHEST 2 VIEW  COMPARISON: 11/03/2014 and 07/12/2012  FINDINGS: Post median sternotomy and cardiac valve surgery. Heart size is upper limits of normal but unchanged. There are chronic parenchymal densities throughout the mid and right lower chest. Mild blunting at the left costophrenic angle is likely chronic as well. No large pleural effusions. Multilevel degenerative endplate changes in lower thoracic spine.  IMPRESSION: Chronic changes in both lungs, right side greater the left. Findings are most likely related to fibrotic changes or scarring. No clear evidence for an acute chest process.     ____________________________________________   PROCEDURES  Procedure(s) performed: None  Critical Care performed: No  ____________________________________________   INITIAL IMPRESSION / ASSESSMENT AND PLAN / ED COURSE  Pertinent labs & imaging results that were available during my care of the patient were reviewed by me and considered in my medical decision making (see chart for details).  Patient presents for further evaluation of diagnosis of pneumonia. Recently on azithromycin for 2 days, now switched to Levaquin and clindamycin at her nursing facility. Patient denies worsening symptoms, family reports they requested emergency room evaluation to make sure there wasn't any reason she needs to be hospitalized if she has required hospitalization with some of her previous pneumonias in the past.  The patient is awake and alert in no distress. She does exhibit rhonchi, but no increased work of  breathing. Her oxygen saturation is normal, and she does use 2 L of oxygen at home at baseline. Overall she appears well, no evidence of sepsis such as fever, tachycardia. No evidence of respiratory distress. At this time, we will evaluate with chest x-ray as well as laboratory evaluation. My clinical suspicion is the patient is stable and does not appear to have an obvious cause for medical admission, as she is oriented antibiotics noting nursing facility without report of worsening. However, we will evaluate her carefully given her age and the ER.  ----------------------------------------- 6:02 PM on 06/29/2015 -----------------------------------------  Patient continues to feel well. No distress. Labs indicate leukocytosis, however no evidence of sepsis. No tachycardia, tachypnea, fever, or evidence of obvious pneumonia. I suspect she likely has bronchiectasis and an episode of associated bronchitis. She will continue antibiotics as artery prescribed a [facility], and follow-up with their physician team. Careful return precautions discussed with patient and her daughter at the bedside who are both agreeable with the plan and return precautions.  ----------------------------------------- 8:39 PM on 06/29/2015 -----------------------------------------  Patient awaiting discharge, she reported to the nurse she's been having chest symptoms for the last 3 days. Repeat EKG is performed and is interpreted as ED ECG REPORT I, QUALE, MARK, the attending physician, personally viewed and interpreted this ECG.   Date: 06/29/2015  EKG Time: 2025  Rate: 70  Rhythm: normal EKG, normal sinus rhythm, unchanged from previous tracings  Axis: Normal  Intervals: Normal  ST&T Change: Normal  ____________________________________________   FINAL CLINICAL IMPRESSION(S) / ED DIAGNOSES  Final diagnoses:  Bronchitis  Bronchiectasis with acute exacerbation (HCC)      Sharyn CreamerMark Quale, MD 06/29/15  [id number]  Sharyn CreamerMark Quale, MD 06/29/15 [id number]

## 2015-06-29 NOTE — ED Notes (Signed)
Pt arrives via [facility] from [facility] with cc of followup and evaluation of previously dx pneumonia. Pt dx with pneumonia and started antibiotics today. Pt states she has generalized body aches but denies any other symptoms. Pt alert and oriented X4, active, cooperative, pt in NAD. RR even and unlabored, color WNL.

## 2015-06-29 NOTE — ED Notes (Signed)
Awaiting EMS to transport patient back to facility.

## 2015-06-29 NOTE — ED Notes (Signed)
Pt. Taken back to [facility] via [facility].

## 2015-06-29 NOTE — Discharge Instructions (Signed)
Please continue antibiotics as planned by your nursing home's physician team.  Please have your physician staff reevaluate this is [place] within 1-2 days.  We believe that your symptoms are caused today by bronchiectasis and bronchitis, an infection in your lung(s).  Fortunately you should start to improve quickly after taking your antibiotics.  Please take the full course of antibiotics as started at [facility] today.   Follow up with your doctor within 1-2 days.  Return to the ER if you have any new or concerning symptoms including but not limited to fever, persistent vomiting, worsening shortness of breath, or other symptoms that concern you, please return to the Emergency Department immediately.    Acute Bronchitis Bronchitis is inflammation of the airways that extend from the windpipe into the lungs (bronchi). The inflammation often causes mucus to develop. This leads to a cough, which is the most common symptom of bronchitis.  In acute bronchitis, the condition usually develops suddenly and goes away over time, usually in a couple weeks. Smoking, allergies, and asthma can make bronchitis worse. Repeated episodes of bronchitis may cause further lung problems.  CAUSES Acute bronchitis is most often caused by the same virus that causes a cold. The virus can spread from person to person (contagious) through coughing, sneezing, and touching contaminated objects. SIGNS AND SYMPTOMS   Cough.   Fever.   Coughing up mucus.   Body aches.   Chest congestion.   Chills.   Shortness of breath.   Sore throat.  DIAGNOSIS  Acute bronchitis is usually diagnosed through a physical exam. Your health care provider will also ask you questions about your medical history. Tests, such as chest X-rays, are sometimes done to rule out other conditions.  TREATMENT  Acute bronchitis usually goes away in a couple weeks. Oftentimes, no medical treatment is necessary. Medicines are sometimes given  for relief of fever or cough. Antibiotic medicines are usually not needed but may be prescribed in certain situations. In some cases, an inhaler may be recommended to help reduce shortness of breath and control the cough. A cool mist vaporizer may also be used to help thin bronchial secretions and make it easier to clear the chest.  HOME CARE INSTRUCTIONS  Get plenty of rest.   Drink enough fluids to keep your urine clear or pale yellow (unless you have a medical condition that requires fluid restriction). Increasing fluids may help thin your respiratory secretions (sputum) and reduce chest congestion, and it will prevent dehydration.   Take medicines only as directed by your health care provider.  If you were prescribed an antibiotic medicine, finish it all even if you start to feel better.  Avoid smoking and secondhand smoke. Exposure to cigarette smoke or irritating chemicals will make bronchitis worse. If you are a smoker, consider using nicotine gum or skin patches to help control withdrawal symptoms. Quitting smoking will help your lungs heal faster.   Reduce the chances of another bout of acute bronchitis by washing your hands frequently, avoiding people with cold symptoms, and trying not to touch your hands to your mouth, nose, or eyes.   Keep all follow-up visits as directed by your health care provider.  SEEK MEDICAL CARE IF: Your symptoms do not improve after 1 week of treatment.  SEEK IMMEDIATE MEDICAL CARE IF:  You develop an increased fever or chills.   You have chest pain.   You have severe shortness of breath.  You have bloody sputum.   You develop dehydration.  You faint or repeatedly feel like you are going to pass out.  You develop repeated vomiting.  You develop a severe headache. MAKE SURE YOU:   Understand these instructions.  Will watch your condition.  Will get help right away if you are not doing well or get worse.   This information is not  intended to replace advice given to you by your health care provider. Make sure you discuss any questions you have with your health care provider.   Document Released: 08/16/2004 Document Revised: 07/30/2014 Document Reviewed: 12/30/2012 Elsevier Interactive Patient Education [employer]

## 2016-02-23 ENCOUNTER — Other Ambulatory Visit
Admission: RE | Admit: 2016-02-23 | Discharge: 2016-02-23 | Disposition: A | Discharge status: Home / Self Care | Payer: Medicare Other | Source: Skilled Nursing Facility | Attending: Family Medicine | Admitting: Family Medicine

## 2016-02-23 DIAGNOSIS — Z1389 Encounter for screening for other disorder: Secondary | ICD-10-CM | POA: Diagnosis present

## 2016-02-25 LAB — LEGIONELLA PNEUMOPHILA SEROGP 1 UR AG: L. pneumophila Serogp 1 Ur Ag: NEGATIVE

## 2016-06-18 ENCOUNTER — Other Ambulatory Visit
Admission: RE | Admit: 2016-06-18 | Discharge: 2016-06-18 | Disposition: A | Discharge status: Home / Self Care | Payer: Medicare Other | Source: Other Acute Inpatient Hospital | Attending: Family Medicine | Admitting: Family Medicine

## 2016-06-18 DIAGNOSIS — R05 Cough: Secondary | ICD-10-CM | POA: Insufficient documentation

## 2016-06-19 LAB — LEGIONELLA PNEUMOPHILA SEROGP 1 UR AG: L. pneumophila Serogp 1 Ur Ag: NEGATIVE

## 2016-08-13 ENCOUNTER — Other Ambulatory Visit
Admission: RE | Admit: 2016-08-13 | Discharge: 2016-08-13 | Disposition: A | Discharge status: Home / Self Care | Payer: Medicare Other | Source: Ambulatory Visit | Attending: Adult Health | Admitting: Adult Health

## 2016-08-13 DIAGNOSIS — Z029 Encounter for administrative examinations, unspecified: Secondary | ICD-10-CM | POA: Insufficient documentation

## 2016-08-13 LAB — INFLUENZA PANEL BY PCR (TYPE A & B)
INFLAPCR: POSITIVE — AB
Influenza B By PCR: NEGATIVE

## 2016-08-19 ENCOUNTER — Encounter: Payer: Self-pay | Admitting: Emergency Medicine

## 2016-08-19 ENCOUNTER — Emergency Department
Admission: EM | Admit: 2016-08-19 | Discharge: 2016-08-19 | Disposition: A | Discharge status: Home / Self Care | Payer: Medicare Other | Attending: Student in an Organized Health Care Education/Training Program | Admitting: Student in an Organized Health Care Education/Training Program

## 2016-08-19 DIAGNOSIS — Z79899 Other long term (current) drug therapy: Secondary | ICD-10-CM | POA: Diagnosis not present

## 2016-08-19 DIAGNOSIS — Z515 Encounter for palliative care: Secondary | ICD-10-CM | POA: Diagnosis not present

## 2016-08-19 DIAGNOSIS — N179 Acute kidney failure, unspecified: Secondary | ICD-10-CM

## 2016-08-19 DIAGNOSIS — S3992XA Unspecified injury of lower back, initial encounter: Secondary | ICD-10-CM | POA: Diagnosis present

## 2016-08-19 DIAGNOSIS — Y999 Unspecified external cause status: Secondary | ICD-10-CM | POA: Diagnosis not present

## 2016-08-19 DIAGNOSIS — X58XXXA Exposure to other specified factors, initial encounter: Secondary | ICD-10-CM | POA: Diagnosis not present

## 2016-08-19 DIAGNOSIS — I10 Essential (primary) hypertension: Secondary | ICD-10-CM | POA: Diagnosis not present

## 2016-08-19 DIAGNOSIS — I2510 Atherosclerotic heart disease of native coronary artery without angina pectoris: Secondary | ICD-10-CM | POA: Insufficient documentation

## 2016-08-19 DIAGNOSIS — Y929 Unspecified place or not applicable: Secondary | ICD-10-CM | POA: Diagnosis not present

## 2016-08-19 DIAGNOSIS — L909 Atrophic disorder of skin, unspecified: Secondary | ICD-10-CM

## 2016-08-19 DIAGNOSIS — S30810A Abrasion of lower back and pelvis, initial encounter: Secondary | ICD-10-CM | POA: Insufficient documentation

## 2016-08-19 DIAGNOSIS — Y939 Activity, unspecified: Secondary | ICD-10-CM | POA: Diagnosis not present

## 2016-08-19 DIAGNOSIS — E875 Hyperkalemia: Secondary | ICD-10-CM | POA: Diagnosis not present

## 2016-08-19 DIAGNOSIS — R238 Other skin changes: Secondary | ICD-10-CM

## 2016-08-19 LAB — CBC WITH DIFFERENTIAL/PLATELET
BASOS ABS: 0 10*3/uL (ref 0–0.1)
Basophils Relative: 0 %
Eosinophils Absolute: 0 10*3/uL (ref 0–0.7)
Eosinophils Relative: 0 %
HEMATOCRIT: 29.2 % — AB (ref 35.0–47.0)
Hemoglobin: 9.2 g/dL — ABNORMAL LOW (ref 12.0–16.0)
LYMPHS PCT: 11 %
Lymphs Abs: 1.6 10*3/uL (ref 1.0–3.6)
MCH: 25.4 pg — ABNORMAL LOW (ref 26.0–34.0)
MCHC: 31.6 g/dL — ABNORMAL LOW (ref 32.0–36.0)
MCV: 80.4 fL (ref 80.0–100.0)
Monocytes Absolute: 1.2 10*3/uL — ABNORMAL HIGH (ref 0.2–0.9)
Monocytes Relative: 8 %
NEUTROS ABS: 12.1 10*3/uL — AB (ref 1.4–6.5)
Neutrophils Relative %: 81 %
PLATELETS: 325 10*3/uL (ref 150–440)
RBC: 3.64 MIL/uL — AB (ref 3.80–5.20)
RDW: 18.5 % — ABNORMAL HIGH (ref 11.5–14.5)
WBC: 14.9 10*3/uL — AB (ref 3.6–11.0)

## 2016-08-19 LAB — BASIC METABOLIC PANEL
ANION GAP: 10 (ref 5–15)
BUN: 99 mg/dL — ABNORMAL HIGH (ref 6–20)
CO2: 14 mmol/L — AB (ref 22–32)
Calcium: 8.3 mg/dL — ABNORMAL LOW (ref 8.9–10.3)
Chloride: 114 mmol/L — ABNORMAL HIGH (ref 101–111)
Creatinine, Ser: 5.56 mg/dL — ABNORMAL HIGH (ref 0.44–1.00)
GFR, EST AFRICAN AMERICAN: 7 mL/min — AB (ref >60)
GFR, EST NON AFRICAN AMERICAN: 6 mL/min — AB (ref >60)
GLUCOSE: 93 mg/dL (ref 65–99)
POTASSIUM: 6.6 mmol/L — AB (ref 3.5–5.1)
Sodium: 138 mmol/L (ref 135–145)

## 2016-08-19 MED ORDER — MORPHINE SULFATE (CONCENTRATE) 10 MG /0.5 ML PO SOLN: 10.0000 mg | ORAL | 0 refills | Status: DC | PRN

## 2016-08-19 MED ORDER — SODIUM CHLORIDE 0.9 % IV BOLUS (SEPSIS): 1000.0000 mL | Freq: Once | INTRAVENOUS | Status: DC

## 2016-08-19 NOTE — ED Notes (Signed)
Paperwork to [employer]

## 2016-08-19 NOTE — ED Provider Notes (Signed)
[facility] Emergency Department Provider Note    First MD Initiated Contact with Patient 08/19/16 1907     (approximate)  I have reviewed the triage vital signs and the nursing notes.   HISTORY  Chief Complaint Abrasion (breakdown to buttocks area)  Level V Caveat:  Dementia  HPI [name] is a 83 y.o. female on [facility] Care presents from [facility] with concern for blood in her diaper this evening. Patient had family members that were visiting. Patient arrives in no acute distress. Unable to provide much history.  When EMS and nursing reported there is no evidence of any hemodynamic instability. No melena. No nausea or vomiting.He is not on any blood thinners.   Past Medical History:  Diagnosis Date  . Aortic stenosis    s/p AV replacement with pig valve  . Blood transfusion without reported diagnosis   . Bronchiectasis   . CAD (coronary artery disease)    Present PCI and MI  . GERD (gastroesophageal reflux disease)   . Hyperlipidemia   . Hypertension   . Hypothyroidism    hx of, but not currently and not on medications  . Legally blind   . OSA (obstructive sleep apnea)    not on CPAP  . Osteoarthritis   . Pneumonia   . Psoriasis   . PUD (peptic ulcer disease)   . Scleroderma (HCC)    Caused hemoptysis about 3 years ago   Family History  Problem Relation Age of Onset  . Heart disease Sister   . Heart disease Brother     MI x 2  . Lung disease Brother     ? dz, was a smoker   Past Surgical History:  Procedure Laterality Date  . ABDOMINAL HYSTERECTOMY  1940  . APPENDECTOMY    . CARDIAC VALVE REPLACEMENT  2011  . CHOLECYSTECTOMY  2007  . CORNEAL TRANSPLANT  2003  . CORONARY ARTERY BYPASS GRAFT  2011  . ESOPHAGOGASTRODUODENOSCOPY  06/23/2012   Procedure: ESOPHAGOGASTRODUODENOSCOPY (EGD);  Surgeon: Petra Kuba, MD;  Location: [facility] ENDOSCOPY;  Service: Endoscopy;  Laterality: N/A;  . ESOPHAGOGASTRODUODENOSCOPY  07/14/2012   Procedure: ESOPHAGOGASTRODUODENOSCOPY (EGD);  Surgeon: Florencia Reasons, MD;  Location: [facility] ENDOSCOPY;  Service: Endoscopy;  Laterality: Left;  . Left shoulder surgery   many years ago   arthoscopy at [facility] twice many years ago  . TOTAL KNEE ARTHROPLASTY  2000   left knee   Patient Active Problem List   Diagnosis Date Noted  . Erosive esophagitis 07/20/2012  . Pain in both feet 07/17/2012  . Acute kidney injury (HCC) 07/12/2012  . Leukocytosis 07/12/2012  . Hypokalemia 06/23/2012  . Nausea vomiting and diarrhea 06/22/2012  . GI bleed 06/22/2012  . Anemia 06/22/2012  . Acute blood loss anemia 06/22/2012  . Respiratory failure, acute-on-chronic (HCC) 06/22/2012  . GERD (gastroesophageal reflux disease) 05/30/2012  . Ischial bursitis 03/26/2012  . Carotid aneurysm, right (HCC) 03/05/2012  . Chronic respiratory failure (HCC) 02/14/2012  . Chronic rhinitis 01/03/2012  . Dyspnea 11/21/2011  . Bioprosthetic aortic valve replacement during current hospitalization 11/21/2011  . CAD (coronary artery disease) 11/21/2011  . Hypertension 11/21/2011  . Hyperlipidemia 11/21/2011  . H/O aortic valve replacement 11/21/2011  . Bronchiectasis (HCC) 11/21/2011      Prior to Admission medications   Medication Sig Start Date End Date Taking? Authorizing Provider  amitriptyline (ELAVIL) 50 MG tablet Take 50 mg by mouth at bedtime.    Historical Provider, MD  amLODipine (  NORVASC) 5 MG tablet Take 1 tablet (5 mg total) by mouth daily. 06/04/12 06/04/13  Jade L Breeback, PA-C  bisacodyl (DULCOLAX) 10 MG suppository Place 1 suppository (10 mg total) rectally daily as needed. 07/21/12   Catarina Hartshornavid Tat, MD  cloNIDine (CATAPRES - DOSED IN MG/24 HR) 0.1 mg/24hr patch Place 1 patch onto the skin once a week. Thursday    Historical Provider, MD  docusate sodium 100 MG CAPS Take 100 mg by mouth 2 (two) times daily. 07/21/12   Catarina Hartshornavid Tat, MD  feeding supplement (RESOURCE BREEZE) LIQD Take 1 Container by mouth 3  (three) times daily between meals. 06/25/12   Sorin Luanne Bras Laza, MD  furosemide (LASIX) 40 MG tablet Take 1 tablet (40 mg total) by mouth daily. 04/30/12   Jade L Breeback, PA-C  ipratropium-albuterol (DUONEB) 0.5-2.5 (3) MG/3ML SOLN Take 3 mLs by nebulization every 4 (four) hours as needed. 05/21/12   Jade L Breeback, PA-C  Melatonin 10 MG TABS Take 1 tablet by mouth at bedtime.    Historical Provider, MD  metoprolol succinate (TOPROL-XL) 100 MG 24 hr tablet Take 1 tablet (100 mg total) by mouth daily. Take with or immediately following a meal. 04/11/12   Jade L Breeback, PA-C  Morphine Sulfate (MORPHINE CONCENTRATE) 10 mg / 0.5 ml concentrated solution Take 0.5 mLs (10 mg total) by mouth every 3 (three) hours as needed for moderate pain, severe pain, anxiety or shortness of breath. 08/19/16   Willy EddyPatrick Tatym Schermer, MD  ondansetron (ZOFRAN ODT) 4 MG disintegrating tablet Take 1 tablet (4 mg total) by mouth every 8 (eight) hours as needed for nausea. 06/20/12   Roxy Horsemanobert Browning, PA-C  pantoprazole (PROTONIX) 40 MG tablet Take 1 tablet (40 mg total) by mouth 2 (two) times daily before a meal. 06/25/12   Sorin Luanne Bras Laza, MD  prednisoLONE acetate (PRED FORTE) 1 % ophthalmic suspension Place 1 drop into both eyes daily. 01/29/12   Lewayne BuntingBrian S Crenshaw, MD  sucralfate (CARAFATE) 1 GM/10ML suspension Take 10 mLs (1 g total) by mouth 4 (four) times daily -  with meals and at bedtime. 06/25/12   Sorin Luanne Bras Laza, MD  topiramate (TOPAMAX) 25 MG tablet Take 1 tablet (25 mg total) by mouth at bedtime. 06/04/12   Jade L Breeback, PA-C  traMADol (ULTRAM) 50 MG tablet Take 1 tablet (50 mg total) by mouth every 8 (eight) hours as needed. 06/25/12   Sorin Luanne Bras Laza, MD  valsartan (DIOVAN) 320 MG tablet Take 1 tablet (320 mg total) by mouth daily. 01/28/12   Lewayne BuntingBrian S Crenshaw, MD    Allergies Atenolol; Augmentin [amoxicillin-pot clavulanate]; Bactroban [mupirocin calcium]; Captopril; Lipitor [atorvastatin]; and Penicillins    Social History Social  History  Substance Use Topics  . Smoking status: Never Smoker  . Smokeless tobacco: Never Used  . Alcohol use No    Review of Systems Patient denies headaches, rhinorrhea, blurry vision, numbness, shortness of breath, chest pain, edema, cough, abdominal pain, nausea, vomiting, diarrhea, dysuria, fevers, rashes or hallucinations unless otherwise stated above in HPI. ____________________________________________   PHYSICAL EXAM:  VITAL SIGNS: Vitals:   08/19/16 2230 08/19/16 2300  BP: (!) 92/43 97/63  Pulse: 70 74    Constitutional: Alert, chronically ill appearing but in no acute distress. Eyes: Conjunctivae are normal. PERRL. EOMI. Head: Atraumatic. Nose: No congestion/rhinnorhea. Mouth/Throat: Mucous membranes are moist.  Oropharynx non-erythematous. Neck: No stridor. Painless ROM. No cervical spine tenderness to palpation Hematological/Lymphatic/Immunilogical: No cervical lymphadenopathy. Cardiovascular: Normal rate, regular rhythm. Grossly normal heart sounds.  Good peripheral circulation. Respiratory: Normal respiratory effort.  No retractions. Lungs with coarse bibasilar breathsounds Gastrointestinal: Soft and nontender. No distention. No abdominal bruits. No CVA tenderness. Genitourinary: area of perirectal and gluteal skin breakdown and slight oozing of blood.  No hemorrhoids.  Soft formed stool in rectal vault, non melanotic, no hematochezia,  Weakly guaiac positive, likely from surrounding skin breakdown Musculoskeletal: No lower extremity tenderness nor edema.  No joint effusions. Neurologic: No gross focal neurologic deficits are appreciated.  Skin:  Skin is warm, dry and intact. No rash noted.  ____________________________________________   LABS (all labs ordered are listed, but only abnormal results are displayed)  Results for orders placed or performed during the hospital encounter of 08/19/16 (from the past 24 hour(s))  CBC with Differential/Platelet      Status: Abnormal   Collection Time: 08/19/16  7:42 PM  Result Value Ref Range   WBC 14.9 (H) 3.6 - 11.0 K/uL   RBC 3.64 (L) 3.80 - 5.20 MIL/uL   Hemoglobin 9.2 (L) 12.0 - 16.0 g/dL   HCT 21.3 (L) 08.6 - 57.8 %   MCV 80.4 80.0 - 100.0 fL   MCH 25.4 (L) 26.0 - 34.0 pg   MCHC 31.6 (L) 32.0 - 36.0 g/dL   RDW 46.9 (H) 62.9 - 52.8 %   Platelets 325 150 - 440 K/uL   Neutrophils Relative % 81 %   Neutro Abs 12.1 (H) 1.4 - 6.5 K/uL   Lymphocytes Relative 11 %   Lymphs Abs 1.6 1.0 - 3.6 K/uL   Monocytes Relative 8 %   Monocytes Absolute 1.2 (H) 0.2 - 0.9 K/uL   Eosinophils Relative 0 %   Eosinophils Absolute 0.0 0 - 0.7 K/uL   Basophils Relative 0 %   Basophils Absolute 0.0 0 - 0.1 K/uL  Basic metabolic panel     Status: Abnormal   Collection Time: 08/19/16  7:42 PM  Result Value Ref Range   Sodium 138 135 - 145 mmol/L   Potassium 6.6 (HH) 3.5 - 5.1 mmol/L   Chloride 114 (H) 101 - 111 mmol/L   CO2 14 (L) 22 - 32 mmol/L   Glucose, Bld 93 65 - 99 mg/dL   BUN 99 (H) 6 - 20 mg/dL   Creatinine, Ser 4.13 (H) 0.44 - 1.00 mg/dL   Calcium 8.3 (L) 8.9 - 10.3 mg/dL   GFR calc non Af Amer 6 (L) >60 mL/min   GFR calc Af Amer 7 (L) >60 mL/min   Anion gap 10 5 - 15   ____________________________________________  E____________________________________________  RADIOLOGY   ____________________________________________   PROCEDURES  Procedure(s) performed:  Procedures    Critical Care performed: no ____________________________________________   INITIAL IMPRESSION / ASSESSMENT AND PLAN / ED COURSE  Pertinent labs & imaging results that were available during my care of the patient were reviewed by me and considered in my medical decision making (see chart for details).  DDX: skin break down, hemhorroid, uti, vaginal bleeding, gi bleed  [name] is a 83 y.o. who presents to the ED with chief complaint of bleeding found in her diaper. Patient afebrile and hemodynamic stable.   Appears to be interacting at her baseline. Her abdominal exam is soft and benign. Patient does appear mildly dehydrated but in no acute distress. Do suspect the bleeding is secondary to skin breakdown.  Due to reported decreased oral intake will check labs. Cornea family patient is a DO NOT RESUSCITATE and there focuses on comfort measures.  Clinical Course  as of Aug 20 53  Sun Aug 19, 2016  2101 Extensive conversation with the patient's daughter, Toniann Fail, regarding the patient's acute hyperkalemia, metabolic acidosis and acute renal failure. Toniann Fail reiterates that the patient is full comfort care and that she does not want any prolonged life prolonging treatments which would include IV fluids. She does not want any temporizing measures such as insulin or calcium. She she does straight a good understanding of the patient's wishes and that her goal would be to pass without any suffering. On reexamination the patient is sleeping comfortably. She does not appear to be in any pain.  I spoke with Amy, nurse at [facility], who confirms that they are comfortable with managing end-of-life care at the facility and do have hospice resources. When he states that she would prefer the patient be transferred back to that facility where she knows the staff.  We'll provide when necessary pain medication. Unfortunately based on her renal failure and hyperkalemia I did relay to Toniann Fail and a me that her prognosis is very poor but our goal will be to keep her discomfort was possible.  [PR]    Clinical Course User Index [PR] Willy Eddy, MD     ____________________________________________   FINAL CLINICAL IMPRESSION(S) / ED DIAGNOSES  Final diagnoses:  Acute renal failure, unspecified acute renal failure type (HCC)  Hyperkalemia  Encounter for end of life care  Skin breakdown      NEW MEDICATIONS STARTED DURING THIS VISIT:  Discharge Medication List as of 08/19/2016  9:08 PM    START taking these  medications   Details  Morphine Sulfate (MORPHINE CONCENTRATE) 10 mg / 0.5 ml concentrated solution Take 0.5 mLs (10 mg total) by mouth every 3 (three) hours as needed for moderate pain, severe pain, anxiety or shortness of breath., Starting Sun 08/19/2016, Print         Note:  This document was prepared using Dragon voice recognition software and may include unintentional dictation errors.    Willy Eddy, MD 08/20/16 [phone]

## 2016-08-19 NOTE — ED Notes (Signed)
Pt received 500 cc bolus from the ems fluids

## 2016-08-19 NOTE — ED Notes (Signed)
[facility]wait[facility]

## 2016-08-23 DEATH — deceased
